# Patient Record
Sex: Female | Born: 1990 | Race: Black or African American | Hispanic: No | Smoking: Former smoker
Health system: Southern US, Community
[De-identification: ages and names within clinical notes are randomized; demographics above are authoritative.]

## PROBLEM LIST (undated history)

## (undated) DIAGNOSIS — N6452 Nipple discharge: Secondary | ICD-10-CM

## (undated) DIAGNOSIS — I10 Essential (primary) hypertension: Secondary | ICD-10-CM

## (undated) DIAGNOSIS — G43909 Migraine, unspecified, not intractable, without status migrainosus: Secondary | ICD-10-CM

## (undated) DIAGNOSIS — Z8669 Personal history of other diseases of the nervous system and sense organs: Principal | ICD-10-CM

## (undated) DIAGNOSIS — IMO0002 Reserved for concepts with insufficient information to code with codable children: Secondary | ICD-10-CM

## (undated) DIAGNOSIS — K219 Gastro-esophageal reflux disease without esophagitis: Secondary | ICD-10-CM

## (undated) DIAGNOSIS — Z87898 Personal history of other specified conditions: Secondary | ICD-10-CM

## (undated) DIAGNOSIS — R87629 Unspecified abnormal cytological findings in specimens from vagina: Secondary | ICD-10-CM

## (undated) HISTORY — DX: Reserved for concepts with insufficient information to code with codable children: IMO0002

## (undated) HISTORY — DX: Personal history of other specified conditions: Z87.898

## (undated) HISTORY — DX: Migraine, unspecified, not intractable, without status migrainosus: G43.909

## (undated) HISTORY — DX: Personal history of other diseases of the nervous system and sense organs: Z86.69

## (undated) HISTORY — PX: NO PAST SURGERIES: SHX2092

## (undated) HISTORY — DX: Unspecified abnormal cytological findings in specimens from vagina: R87.629

## (undated) HISTORY — DX: Gastro-esophageal reflux disease without esophagitis: K21.9

## (undated) HISTORY — DX: Nipple discharge: N64.52

---

## 2011-11-03 ENCOUNTER — Other Ambulatory Visit: Payer: Self-pay | Admitting: Family Medicine

## 2011-11-03 ENCOUNTER — Ambulatory Visit (HOSPITAL_COMMUNITY)
Admission: RE | Admit: 2011-11-03 | Discharge: 2011-11-03 | Disposition: A | Discharge status: Home / Self Care | Payer: BC Managed Care – PPO | Source: Ambulatory Visit | Attending: Family Medicine | Admitting: Family Medicine

## 2011-11-03 ENCOUNTER — Other Ambulatory Visit (HOSPITAL_COMMUNITY): Payer: Self-pay | Admitting: Family Medicine

## 2011-11-03 DIAGNOSIS — R102 Pelvic and perineal pain: Secondary | ICD-10-CM

## 2011-11-03 DIAGNOSIS — R9389 Abnormal findings on diagnostic imaging of other specified body structures: Secondary | ICD-10-CM | POA: Insufficient documentation

## 2011-11-03 DIAGNOSIS — N949 Unspecified condition associated with female genital organs and menstrual cycle: Secondary | ICD-10-CM | POA: Insufficient documentation

## 2012-07-18 ENCOUNTER — Ambulatory Visit (HOSPITAL_COMMUNITY)
Admission: RE | Admit: 2012-07-18 | Discharge: 2012-07-18 | Disposition: A | Discharge status: Home / Self Care | Payer: BC Managed Care – PPO | Source: Ambulatory Visit | Attending: Family Medicine | Admitting: Family Medicine

## 2012-07-18 ENCOUNTER — Other Ambulatory Visit (HOSPITAL_COMMUNITY): Payer: Self-pay | Admitting: Family Medicine

## 2012-07-18 DIAGNOSIS — Z3201 Encounter for pregnancy test, result positive: Secondary | ICD-10-CM

## 2012-07-18 DIAGNOSIS — R102 Pelvic and perineal pain: Secondary | ICD-10-CM

## 2012-07-18 DIAGNOSIS — R109 Unspecified abdominal pain: Secondary | ICD-10-CM | POA: Insufficient documentation

## 2012-08-06 LAB — CYSTIC FIBROSIS DIAGNOSTIC STUDY: Interpretation-CFDNA:: NEGATIVE

## 2012-08-06 LAB — OB RESULTS CONSOLE PLATELET COUNT: Platelets: 329 10*3/uL

## 2012-08-06 LAB — OB RESULTS CONSOLE GC/CHLAMYDIA
Chlamydia: NEGATIVE
Gonorrhea: NEGATIVE

## 2012-08-06 LAB — OB RESULTS CONSOLE RPR: RPR: NONREACTIVE

## 2012-08-06 LAB — OB RESULTS CONSOLE HGB/HCT, BLOOD
HCT: 34 %
Hemoglobin: 11.8 g/dL

## 2012-08-06 LAB — OB RESULTS CONSOLE TSH: TSH: 0.546

## 2012-08-06 LAB — OB RESULTS CONSOLE ABO/RH: RH Type: POSITIVE

## 2012-08-06 LAB — OB RESULTS CONSOLE HIV ANTIBODY (ROUTINE TESTING): HIV: NONREACTIVE

## 2012-08-08 ENCOUNTER — Other Ambulatory Visit: Payer: Self-pay | Admitting: Obstetrics & Gynecology

## 2012-08-08 DIAGNOSIS — Z36 Encounter for antenatal screening of mother: Secondary | ICD-10-CM

## 2012-08-13 ENCOUNTER — Encounter: Payer: Self-pay | Admitting: Adult Health

## 2012-08-13 DIAGNOSIS — Z8669 Personal history of other diseases of the nervous system and sense organs: Secondary | ICD-10-CM

## 2012-08-13 HISTORY — DX: Personal history of other diseases of the nervous system and sense organs: Z86.69

## 2012-08-20 ENCOUNTER — Telehealth: Payer: Self-pay | Admitting: Adult Health

## 2012-08-22 NOTE — Telephone Encounter (Signed)
Spoke with pt. She forgot question she was wanting to ask. Advised to call back if she remembers.

## 2012-08-27 ENCOUNTER — Telehealth: Payer: Self-pay | Admitting: Adult Health

## 2012-08-27 NOTE — Telephone Encounter (Signed)
Spoke with pt. 3 months pregnant. Cramps started 2 hours ago. No spotting or bleeding. Cramping every 20 minutes. Advised to empty bladder. Sip clear liquids. Lay on left side x 1-2 hrs. If cramping don't stop, go to [facility] at [facility] for eval. Pt voiced understanding.

## 2012-09-03 ENCOUNTER — Encounter: Payer: Self-pay | Admitting: Obstetrics & Gynecology

## 2012-09-03 ENCOUNTER — Ambulatory Visit (INDEPENDENT_AMBULATORY_CARE_PROVIDER_SITE_OTHER): Payer: BC Managed Care – PPO | Admitting: Obstetrics & Gynecology

## 2012-09-03 ENCOUNTER — Ambulatory Visit (INDEPENDENT_AMBULATORY_CARE_PROVIDER_SITE_OTHER): Payer: Medicaid Other

## 2012-09-03 ENCOUNTER — Other Ambulatory Visit: Payer: Self-pay | Admitting: Obstetrics & Gynecology

## 2012-09-03 VITALS — BP 110/70 | Wt 107.0 lb

## 2012-09-03 DIAGNOSIS — Z331 Pregnant state, incidental: Secondary | ICD-10-CM

## 2012-09-03 DIAGNOSIS — Z3400 Encounter for supervision of normal first pregnancy, unspecified trimester: Secondary | ICD-10-CM

## 2012-09-03 DIAGNOSIS — Z36 Encounter for antenatal screening of mother: Secondary | ICD-10-CM

## 2012-09-03 DIAGNOSIS — Z3401 Encounter for supervision of normal first pregnancy, first trimester: Secondary | ICD-10-CM

## 2012-09-03 DIAGNOSIS — Z1389 Encounter for screening for other disorder: Secondary | ICD-10-CM

## 2012-09-03 LAB — POCT URINALYSIS DIPSTICK
Blood, UA: NEGATIVE
Nitrite, UA: NEGATIVE

## 2012-09-03 NOTE — Progress Notes (Signed)
U/S-12+4wks-single IUP with + FCA noted FHR=155BPM, CRL c/w dates, cx long and closed, bilateral adnexa wnl, NB present, NT=1.55mm

## 2012-09-03 NOTE — Patient Instructions (Signed)
Pregnancy - First Trimester During sexual intercourse, millions of sperm go into the vagina. Only 1 sperm will penetrate and fertilize the female egg while it is in the Fallopian tube. One week later, the fertilized egg implants into the wall of the uterus. An embryo begins to develop into a baby. At 6 to 8 weeks, the eyes and face are formed and the heartbeat can be seen on ultrasound. At the end of 12 weeks (first trimester), all the baby's organs are formed. Now that you are pregnant, you will want to do everything you can to have a healthy baby. Two of the most important things are to get good prenatal care and follow your caregiver's instructions. Prenatal care is all the medical care you receive before the baby's birth. It is given to prevent, find, and treat problems during the pregnancy and childbirth. PRENATAL EXAMS  During prenatal visits, your weight, blood pressure and urine are checked. This is done to make sure you are healthy and progressing normally during the pregnancy.  A pregnant woman should gain 25 to 35 pounds during the pregnancy. However, if you are over weight or underweight, your caregiver will advise you regarding your weight.  Your caregiver will ask and answer questions for you.  Blood work, cervical cultures, other necessary tests and a Pap test are done during your prenatal exams. These tests are done to check on your health and the probable health of your baby. Tests are strongly recommended and done for HIV with your permission. This is the virus that causes AIDS. These tests are done because medications can be given to help prevent your baby from being born with this infection should you have been infected without knowing it. Blood work is also used to find out your blood type, previous infections and follow your blood levels (hemoglobin).  Low hemoglobin (anemia) is common during pregnancy. Iron and vitamins are given to help prevent this. Later in the pregnancy, blood  tests for diabetes will be done along with any other tests if any problems develop. You may need tests to make sure you and the baby are doing well.  You may need other tests to make sure you and the baby are doing well. CHANGES DURING THE FIRST TRIMESTER (THE FIRST 3 MONTHS OF PREGNANCY) Your body goes through many changes during pregnancy. They vary from person to person. Talk to your caregiver about changes you notice and are concerned about. Changes can include:  Your menstrual period stops.  The egg and sperm carry the genes that determine what you look like. Genes from you and your partner are forming a baby. The female genes determine whether the baby is a boy or a girl.  Your body increases in girth and you may feel bloated.  Feeling sick to your stomach (nauseous) and throwing up (vomiting). If the vomiting is uncontrollable, call your caregiver.  Your breasts will begin to enlarge and become tender.  Your nipples may stick out more and become darker.  The need to urinate more. Painful urination may mean you have a bladder infection.  Tiring easily.  Loss of appetite.  Cravings for certain kinds of food.  At first, you may gain or lose a couple of pounds.  You may have changes in your emotions from day to day (excited to be pregnant or concerned something may go wrong with the pregnancy and baby).  You may have more vivid and strange dreams. HOME CARE INSTRUCTIONS   It is very important   to avoid all smoking, alcohol and un-prescribed drugs during your pregnancy. These affect the formation and growth of the baby. Avoid chemicals while pregnant to ensure the delivery of a healthy infant.  Start your prenatal visits by the 12th week of pregnancy. They are usually scheduled monthly at first, then more often in the last 2 months before delivery. Keep your caregiver's appointments. Follow your caregiver's instructions regarding medication use, blood and lab tests, exercise, and  diet.  During pregnancy, you are providing food for you and your baby. Eat regular, well-balanced meals. Choose foods such as meat, fish, milk and other low fat dairy products, vegetables, fruits, and whole-grain breads and cereals. Your caregiver will tell you of the ideal weight gain.  You can help morning sickness by keeping soda crackers at the bedside. Eat a couple before arising in the morning. You may want to use the crackers without salt on them.  Eating 4 to 5 small meals rather than 3 large meals a day also may help the nausea and vomiting.  Drinking liquids between meals instead of during meals also seems to help nausea and vomiting.  A physical sexual relationship may be continued throughout pregnancy if there are no other problems. Problems may be early (premature) leaking of amniotic fluid from the membranes, vaginal bleeding, or belly (abdominal) pain.  Exercise regularly if there are no restrictions. Check with your caregiver or physical therapist if you are unsure of the safety of some of your exercises. Greater weight gain will occur in the last 2 trimesters of pregnancy. Exercising will help:  Control your weight.  Keep you in shape.  Prepare you for labor and delivery.  Help you lose your pregnancy weight after you deliver your baby.  Wear a good support or jogging bra for breast tenderness during pregnancy. This may help if worn during sleep too.  Ask when prenatal classes are available. Begin classes when they are offered.  Do not use hot tubs, steam rooms or saunas.  Wear your seat belt when driving. This protects you and your baby if you are in an accident.  Avoid raw meat, uncooked cheese, cat litter boxes and soil used by cats throughout the pregnancy. These carry germs that can cause birth defects in the baby.  The first trimester is a good time to visit your dentist for your dental health. Getting your teeth cleaned is OK. Use a softer toothbrush and brush  gently during pregnancy.  Ask for help if you have financial, counseling or nutritional needs during pregnancy. Your caregiver will be able to offer counseling for these needs as well as refer you for other special needs.  Do not take any medications or herbs unless told by your caregiver.  Inform your caregiver if there is any mental or physical domestic violence.  Make a list of emergency phone numbers of family, friends, hospital, and police and fire departments.  Write down your questions. Take them to your prenatal visit.  Do not douche.  Do not cross your legs.  If you have to stand for long periods of time, rotate you feet or take small steps in a circle.  You may have more vaginal secretions that may require a sanitary pad. Do not use tampons or scented sanitary pads. MEDICATIONS AND DRUG USE IN PREGNANCY  Take prenatal vitamins as directed. The vitamin should contain 1 milligram of folic acid. Keep all vitamins out of reach of children. Only a couple vitamins or tablets containing iron may be   fatal to a baby or young child when ingested.  Avoid use of all medications, including herbs, over-the-counter medications, not prescribed or suggested by your caregiver. Only take over-the-counter or prescription medicines for pain, discomfort, or fever as directed by your caregiver. Do not use aspirin, ibuprofen, or naproxen unless directed by your caregiver.  Let your caregiver also know about herbs you may be using.  Alcohol is related to a number of birth defects. This includes fetal alcohol syndrome. All alcohol, in any form, should be avoided completely. Smoking will cause low birth rate and premature babies.  Street or illegal drugs are very harmful to the baby. They are absolutely forbidden. A baby born to an addicted mother will be addicted at birth. The baby will go through the same withdrawal an adult does.  Let your caregiver know about any medications that you have to take  and for what reason you take them. MISCARRIAGE IS COMMON DURING PREGNANCY A miscarriage does not mean you did something wrong. It is not a reason to worry about getting pregnant again. Your caregiver will help you with questions you may have. If you have a miscarriage, you may need minor surgery. SEEK MEDICAL CARE IF:  You have any concerns or worries during your pregnancy. It is better to call with your questions if you feel they cannot wait, rather than worry about them. SEEK IMMEDIATE MEDICAL CARE IF:   An unexplained oral temperature above 102 F (38.9 C) develops, or as your caregiver suggests.  You have leaking of fluid from the vagina (birth canal). If leaking membranes are suspected, take your temperature and inform your caregiver of this when you call.  There is vaginal spotting or bleeding. Notify your caregiver of the amount and how many pads are used.  You develop a bad smelling vaginal discharge with a change in the color.  You continue to feel sick to your stomach (nauseated) and have no relief from remedies suggested. You vomit blood or coffee ground-like materials.  You lose more than 2 pounds of weight in 1 week.  You gain more than 2 pounds of weight in 1 week and you notice swelling of your face, hands, feet, or legs.  You gain 5 pounds or more in 1 week (even if you do not have swelling of your hands, face, legs, or feet).  You get exposed to German measles and have never had them.  You are exposed to fifth disease or chickenpox.  You develop belly (abdominal) pain. Round ligament discomfort is a common non-cancerous (benign) cause of abdominal pain in pregnancy. Your caregiver still must evaluate this.  You develop headache, fever, diarrhea, pain with urination, or shortness of breath.  You fall or are in a car accident or have any kind of trauma.  There is mental or physical violence in your home. Document Released: 05/10/2001 Document Revised: 08/08/2011  Document Reviewed: 11/11/2008 [employer] Patient Information 2013 [employer].  

## 2012-09-03 NOTE — Progress Notes (Signed)
No bleeding, no complaints.  Sonogram report reviewed with patient..  Normal questions answereed, no other complaints

## 2012-09-06 LAB — MATERNAL SCREEN, INTEGRATED #1
Crown Rump Length: 64.2 mm
Maternal weight: 107 [lb_av]
Nuchal translucency: 1.43 mm
Referring physician phone: 3363426063

## 2012-10-02 ENCOUNTER — Encounter: Payer: Self-pay | Admitting: Obstetrics & Gynecology

## 2012-10-02 ENCOUNTER — Ambulatory Visit (INDEPENDENT_AMBULATORY_CARE_PROVIDER_SITE_OTHER): Payer: Medicaid Other | Admitting: Obstetrics & Gynecology

## 2012-10-02 ENCOUNTER — Other Ambulatory Visit: Payer: Self-pay | Admitting: Obstetrics & Gynecology

## 2012-10-02 VITALS — BP 110/70 | Wt 109.0 lb

## 2012-10-02 DIAGNOSIS — Z3400 Encounter for supervision of normal first pregnancy, unspecified trimester: Secondary | ICD-10-CM

## 2012-10-02 DIAGNOSIS — Z3402 Encounter for supervision of normal first pregnancy, second trimester: Secondary | ICD-10-CM

## 2012-10-02 NOTE — Progress Notes (Signed)
BP weight and urine results all reviewed and noted. Patient reports good fetal movement, denies any bleeding and no rupture of membranes symptoms or regular contractions. Patient is without complaints. All questions were answered.  

## 2012-10-02 NOTE — Patient Instructions (Signed)
How a Baby Grows During Pregnancy Pregnancy begins when the female's sperm enters the female's egg. This happens in the fallopian tube and is called fertilization. The fertilized egg is called an embryo until it reaches 9 weeks from the time of fertilization. From 9 weeks until birth it is called a fetus. The fertilized egg moves down the tube into the uterus and attaches to the inside lining of the uterus.  The pregnant woman is responsible for the growth of the embryo/fetus by supplying nourishment and oxygen through the blood stream and placenta to the developing fetus. The uterus becomes larger and pops out from the abdomen more and more as the fetus develops and grows. A normal pregnancy lasts 280 days, with a range of 259 to 294 days, or 40 weeks. The pregnancy is divided up into three trimesters:  First trimester - 0 to 13 weeks.  Second trimester - 14 to 27 weeks.  Third trimester - 28 to 40 weeks. The day your baby is supposed to be born is called estimated date of confinement (EDC) or estimated date of delivery (EDD). GROWTH OF THE BABY MONTH BY MONTH 1. First Month: The fertilized egg attaches to the inside of the uterus and certain cells will form the placenta and others will develop into the fetus. The arms, legs, brain, spinal cord, lungs, and heart begin to develop. At the end of the first month the heart begins to beat. The embryo weighs less than an ounce and is  inch long. 2. Second Month: The bones can be seen, the inner ear, eye lids, hands and feet form and genitals develop. By the end of 8 weeks, all of the major organs are developing. The fetus now weighs less than an ounce and is one inch (2.54 cm) long. 3. Third Month: Teeth buds appear, all the internal organs are forming, bones and muscles begin to grow, the spine can flex and the skin is transparent. Finger and toe nails begin to form, the hands develop faster than the feet and the arms are longer than the legs at this point.  The fetus weighs a little more than an ounce (0.03 kg) and is 3 inches (8.89cm) long. 4. Fourth Month: The placenta is completely formed. The external sex organs, neck, outer ear, eyebrows, eyelids and fingernails are formed. The fetus can hear, swallow, flex its arms and legs and the kidney begins to produce urine. The skin is covered with a white waxy coating (vernix) and very thin hair (lanugo) is present. The fetus weighs 5 ounces (0.14kg) and is 6 to 7 inches (16.51cm) long. 5. Fifth Month: The fetus moves around more and can be felt for the first time (called quickening), sleeps and wakes up at times, may begin to suck its finger and the nails grow to the end of the fingers. The gallbladder is now functioning and helps to digest the nutrients, eggs are formed in the female and the testicles begin to drop down from the abdomen to the scrotum in the female. The fetus weighs  to 1 pound (0.45kg) and is 10 inches (25.4cm) long. 6. Sixth Month: The lungs are formed but the fetus does not breath yet. The eyes open, the brain develops more quickly at this time, one can detect finger and toe prints and thicker hair grows. The fetus weighs 1 to 1 pounds (0.68kg) and is 12 inches (30.48cm) long. 7. Seventh Month: The fetus can hear and respond to sounds, kicks and stretches and can sense   changes in light. The fetus weighs 2 to 2 pounds (1.13kg) and is 14 inches (35.56cm) long. 8. Eight Month: All organs and body systems are fully developed and functioning. The bones get harder, taste buds develop and can taste sweet and sour flavors and the fetus may hiccup now. Different parts of the brain are developing and the skull remains soft for the brain to grow. The fetus weighs 5 pounds (2.27kg) and is 18 inches (45.75cm) long. 9. Ninth Month: The fetus gains about a half a pound a week, the lungs are fully developed, patterns of sleep develop and the head moves down into the bottom of the uterus called vertex. If the  buttocks moves into the bottom of the uterus, it is called a breech. The fetus weighs 6 to 9 pounds (2.72 to 4.08kg) and is 20 inches (50.8cm) long. You should be informed about your pregnancy, yourself and how the baby is developing as much as possible. Being informed helps you to enjoy this experience. It also gives you the sense to feel if something is not going right and when to ask questions. Talk to your caregiver when you have questions about your baby or your own body. Document Released: 11/02/2007 Document Revised: 08/08/2011 Document Reviewed: 11/02/2007 [employer] Patient Information 2013 [employer].  

## 2012-10-07 LAB — MATERNAL SCREEN, INTEGRATED #2
AFP MoM: 1.32
Calculated Gestational Age: 16.7
Crown Rump Length: 64.2 mm
MSS Trisomy 18 Risk: <1:5000 {titer}
NT MoM: 0.98
Nuchal Translucency: 1.43 mm
PAPP-A MoM: 1
hCG, Serum: 29.5 IU/mL

## 2012-10-23 ENCOUNTER — Ambulatory Visit (INDEPENDENT_AMBULATORY_CARE_PROVIDER_SITE_OTHER): Payer: Medicaid Other

## 2012-10-23 ENCOUNTER — Ambulatory Visit (INDEPENDENT_AMBULATORY_CARE_PROVIDER_SITE_OTHER): Payer: Medicaid Other | Admitting: Women's Health

## 2012-10-23 ENCOUNTER — Encounter: Payer: Self-pay | Admitting: Women's Health

## 2012-10-23 VITALS — BP 110/74 | Wt 109.0 lb

## 2012-10-23 DIAGNOSIS — Z331 Pregnant state, incidental: Secondary | ICD-10-CM

## 2012-10-23 DIAGNOSIS — Z1389 Encounter for screening for other disorder: Secondary | ICD-10-CM

## 2012-10-23 DIAGNOSIS — Z3402 Encounter for supervision of normal first pregnancy, second trimester: Secondary | ICD-10-CM

## 2012-10-23 DIAGNOSIS — Z3400 Encounter for supervision of normal first pregnancy, unspecified trimester: Secondary | ICD-10-CM

## 2012-10-23 LAB — POCT URINALYSIS DIPSTICK
Ketones, UA: NEGATIVE
Leukocytes, UA: NEGATIVE

## 2012-10-23 NOTE — Progress Notes (Signed)
U/S(19+5wks)-active fetus, meas c/w dates, fluid wnl, no major abnl noted, anterior plac gr 0, cx long and closed, bilateral adnexa wnl, female fetus

## 2012-10-23 NOTE — Progress Notes (Signed)
Reports good fm. Denies uc's, lof, vb, urinary frequency, urgency, hesitancy, or dysuria.  No complaints.  Reviewed u/s results from today, ptl s/s, childbirth classes.  All questions answered. F/U in 4wks for visit.

## 2012-10-23 NOTE — Patient Instructions (Addendum)
Pregnancy - Second Trimester The second trimester of pregnancy (3 to 6 months) is a period of rapid growth for you and your baby. At the end of the sixth month, your baby is about 9 inches long and weighs 1 1/2 pounds. You will begin to feel the baby move between 18 and 20 weeks of the pregnancy. This is called quickening. Weight gain is faster. A clear fluid (colostrum) may leak out of your breasts. You may feel small contractions of the womb (uterus). This is known as false labor or Braxton-Hicks contractions. This is like a practice for labor when the baby is ready to be born. Usually, the problems with morning sickness have usually passed by the end of your first trimester. Some women develop small dark blotches (called cholasma, mask of pregnancy) on their face that usually goes away after the baby is born. Exposure to the sun makes the blotches worse. Acne may also develop in some pregnant women and pregnant women who have acne, may find that it goes away. PRENATAL EXAMS  Blood work may continue to be done during prenatal exams. These tests are done to check on your health and the probable health of your baby. Blood work is used to follow your blood levels (hemoglobin). Anemia (low hemoglobin) is common during pregnancy. Iron and vitamins are given to help prevent this. You will also be checked for diabetes between 24 and 28 weeks of the pregnancy. Some of the previous blood tests may be repeated.  The size of the uterus is measured during each visit. This is to make sure that the baby is continuing to grow properly according to the dates of the pregnancy.  Your blood pressure is checked every prenatal visit. This is to make sure you are not getting toxemia.  Your urine is checked to make sure you do not have an infection, diabetes or protein in the urine.  Your weight is checked often to make sure gains are happening at the suggested rate. This is to ensure that both you and your baby are  growing normally.  Sometimes, an ultrasound is performed to confirm the proper growth and development of the baby. This is a test which bounces harmless sound waves off the baby so your caregiver can more accurately determine due dates. Sometimes, a test is done on the amniotic fluid surrounding the baby. This test is called an amniocentesis. The amniotic fluid is obtained by sticking a needle into the belly (abdomen). This is done to check the chromosomes in instances where there is a concern about possible genetic problems with the baby. It is also sometimes done near the end of pregnancy if an early delivery is required. In this case, it is done to help make sure the baby's lungs are mature enough for the baby to live outside of the womb. CHANGES OCCURING IN THE SECOND TRIMESTER OF PREGNANCY Your body goes through many changes during pregnancy. They vary from person to person. Talk to your caregiver about changes you notice that you are concerned about.  During the second trimester, you will likely have an increase in your appetite. It is normal to have cravings for certain foods. This varies from person to person and pregnancy to pregnancy.  Your lower abdomen will begin to bulge.  You may have to urinate more often because the uterus and baby are pressing on your bladder. It is also common to get more bladder infections during pregnancy. You can help this by drinking lots of fluids   and emptying your bladder before and after intercourse.  You may begin to get stretch marks on your hips, abdomen, and breasts. These are normal changes in the body during pregnancy. There are no exercises or medicines to take that prevent this change.  You may begin to develop swollen and bulging veins (varicose veins) in your legs. Wearing support hose, elevating your feet for 15 minutes, 3 to 4 times a day and limiting salt in your diet helps lessen the problem.  Heartburn may develop as the uterus grows and  pushes up against the stomach. Antacids recommended by your caregiver helps with this problem. Also, eating smaller meals 4 to 5 times a day helps.  Constipation can be treated with a stool softener or adding bulk to your diet. Drinking lots of fluids, and eating vegetables, fruits, and whole grains are helpful.  Exercising is also helpful. If you have been very active up until your pregnancy, most of these activities can be continued during your pregnancy. If you have been less active, it is helpful to start an exercise program such as walking.  Hemorrhoids may develop at the end of the second trimester. Warm sitz baths and hemorrhoid cream recommended by your caregiver helps hemorrhoid problems.  Backaches may develop during this time of your pregnancy. Avoid heavy lifting, wear low heal shoes, and practice good posture to help with backache problems.  Some pregnant women develop tingling and numbness of their hand and fingers because of swelling and tightening of ligaments in the wrist (carpel tunnel syndrome). This goes away after the baby is born.  As your breasts enlarge, you may have to get a bigger bra. Get a comfortable, cotton, support bra. Do not get a nursing bra until the last month of the pregnancy if you will be nursing the baby.  You may get a dark line from your belly button to the pubic area called the linea nigra.  You may develop rosy cheeks because of increase blood flow to the face.  You may develop spider looking lines of the face, neck, arms, and chest. These go away after the baby is born. HOME CARE INSTRUCTIONS   It is extremely important to avoid all smoking, herbs, alcohol, and unprescribed drugs during your pregnancy. These chemicals affect the formation and growth of the baby. Avoid these chemicals throughout the pregnancy to ensure the delivery of a healthy infant.  Most of your home care instructions are the same as suggested for the first trimester of your  pregnancy. Keep your caregiver's appointments. Follow your caregiver's instructions regarding medicine use, exercise, and diet.  During pregnancy, you are providing food for you and your baby. Continue to eat regular, well-balanced meals. Choose foods such as meat, fish, milk and other low fat dairy products, vegetables, fruits, and whole-grain breads and cereals. Your caregiver will tell you of the ideal weight gain.  A physical sexual relationship may be continued up until near the end of pregnancy if there are no other problems. Problems could include early (premature) leaking of amniotic fluid from the membranes, vaginal bleeding, abdominal pain, or other medical or pregnancy problems.  Exercise regularly if there are no restrictions. Check with your caregiver if you are unsure of the safety of some of your exercises. The greatest weight gain will occur in the last 2 trimesters of pregnancy. Exercise will help you:  Control your weight.  Get you in shape for labor and delivery.  Lose weight after you have the baby.  Wear   a good support or jogging bra for breast tenderness during pregnancy. This may help if worn during sleep. Pads or tissues may be used in the bra if you are leaking colostrum.  Do not use hot tubs, steam rooms or saunas throughout the pregnancy.  Wear your seat belt at all times when driving. This protects you and your baby if you are in an accident.  Avoid raw meat, uncooked cheese, cat litter boxes, and soil used by cats. These carry germs that can cause birth defects in the baby.  The second trimester is also a good time to visit your dentist for your dental health if this has not been done yet. Getting your teeth cleaned is okay. Use a soft toothbrush. Brush gently during pregnancy.  It is easier to leak urine during pregnancy. Tightening up and strengthening the pelvic muscles will help with this problem. Practice stopping your urination while you are going to the  bathroom. These are the same muscles you need to strengthen. It is also the muscles you would use as if you were trying to stop from passing gas. You can practice tightening these muscles up 10 times a set and repeating this about 3 times per day. Once you know what muscles to tighten up, do not perform these exercises during urination. It is more likely to contribute to an infection by backing up the urine.  Ask for help if you have financial, counseling, or nutritional needs during pregnancy. Your caregiver will be able to offer counseling for these needs as well as refer you for other special needs.  Your skin may become oily. If so, wash your face with mild soap, use non-greasy moisturizer and oil or cream based makeup. MEDICINES AND DRUG USE IN PREGNANCY  Take prenatal vitamins as directed. The vitamin should contain 1 milligram of folic acid. Keep all vitamins out of reach of children. Only a couple vitamins or tablets containing iron may be fatal to a baby or young child when ingested.  Avoid use of all medicines, including herbs, over-the-counter medicines, not prescribed or suggested by your caregiver. Only take over-the-counter or prescription medicines for pain, discomfort, or fever as directed by your caregiver. Do not use aspirin.  Let your caregiver also know about herbs you may be using.  Alcohol is related to a number of birth defects. This includes fetal alcohol syndrome. All alcohol, in any form, should be avoided completely. Smoking will cause low birth rate and premature babies.  Street or illegal drugs are very harmful to the baby. They are absolutely forbidden. A baby born to an addicted mother will be addicted at birth. The baby will go through the same withdrawal an adult does. SEEK MEDICAL CARE IF:  You have any concerns or worries during your pregnancy. It is better to call with your questions if you feel they cannot wait, rather than worry about them. SEEK IMMEDIATE  MEDICAL CARE IF:   An unexplained oral temperature above 102 F (38.9 C) develops, or as your caregiver suggests.  You have leaking of fluid from the vagina (birth canal). If leaking membranes are suspected, take your temperature and tell your caregiver of this when you call.  There is vaginal spotting, bleeding, or passing clots. Tell your caregiver of the amount and how many pads are used. Light spotting in pregnancy is common, especially following intercourse.  You develop a bad smelling vaginal discharge with a change in the color from clear to white.  You continue to feel   sick to your stomach (nauseated) and have no relief from remedies suggested. You vomit blood or coffee ground-like materials.  You lose more than 2 pounds of weight or gain more than 2 pounds of weight over 1 week, or as suggested by your caregiver.  You notice swelling of your face, hands, feet, or legs.  You get exposed to German measles and have never had them.  You are exposed to fifth disease or chickenpox.  You develop belly (abdominal) pain. Round ligament discomfort is a common non-cancerous (benign) cause of abdominal pain in pregnancy. Your caregiver still must evaluate you.  You develop a bad headache that does not go away.  You develop fever, diarrhea, pain with urination, or shortness of breath.  You develop visual problems, blurry, or double vision.  You fall or are in a car accident or any kind of trauma.  There is mental or physical violence at home. Document Released: 05/10/2001 Document Revised: 02/08/2012 Document Reviewed: 11/12/2008 [employer] Patient Information 2014 [employer].  

## 2012-10-23 NOTE — Progress Notes (Signed)
Cramps in both sides. Left leg goes numb.

## 2012-10-24 LAB — US OB COMP + 14 WK

## 2012-11-20 ENCOUNTER — Ambulatory Visit (INDEPENDENT_AMBULATORY_CARE_PROVIDER_SITE_OTHER): Payer: Medicaid Other | Admitting: Women's Health

## 2012-11-20 ENCOUNTER — Encounter: Payer: Self-pay | Admitting: Women's Health

## 2012-11-20 VITALS — BP 118/68 | Wt 112.8 lb

## 2012-11-20 DIAGNOSIS — Z3400 Encounter for supervision of normal first pregnancy, unspecified trimester: Secondary | ICD-10-CM

## 2012-11-20 DIAGNOSIS — Z331 Pregnant state, incidental: Secondary | ICD-10-CM

## 2012-11-20 DIAGNOSIS — Z3402 Encounter for supervision of normal first pregnancy, second trimester: Secondary | ICD-10-CM

## 2012-11-20 DIAGNOSIS — Z1389 Encounter for screening for other disorder: Secondary | ICD-10-CM

## 2012-11-20 LAB — POCT URINALYSIS DIPSTICK
Blood, UA: NEGATIVE
Glucose, UA: NEGATIVE
Leukocytes, UA: NEGATIVE
Nitrite, UA: NEGATIVE

## 2012-11-20 NOTE — Progress Notes (Signed)
Unable to void. Reports good fm. Denies uc's, lof, vb, urinary frequency, urgency, hesitancy, or dysuria.  No complaints.  Reviewed ptl s/s, fm.  All questions answered. F/U in 4wks for pn2 and visit.

## 2012-11-20 NOTE — Patient Instructions (Signed)
You will have your sugar test next visit.  Please do not eat or drink anything after midnight the night before you come, not even water.  You will be here for at least two hours.    Pregnancy - Second Trimester The second trimester of pregnancy (3 to 6 months) is a period of rapid growth for you and your baby. At the end of the sixth month, your baby is about 9 inches long and weighs 1 1/2 pounds. You will begin to feel the baby move between 18 and 20 weeks of the pregnancy. This is called quickening. Weight gain is faster. A clear fluid (colostrum) may leak out of your breasts. You may feel small contractions of the womb (uterus). This is known as false labor or Braxton-Hicks contractions. This is like a practice for labor when the baby is ready to be born. Usually, the problems with morning sickness have usually passed by the end of your first trimester. Some women develop small dark blotches (called cholasma, mask of pregnancy) on their face that usually goes away after the baby is born. Exposure to the sun makes the blotches worse. Acne may also develop in some pregnant women and pregnant women who have acne, may find that it goes away. PRENATAL EXAMS  Blood work may continue to be done during prenatal exams. These tests are done to check on your health and the probable health of your baby. Blood work is used to follow your blood levels (hemoglobin). Anemia (low hemoglobin) is common during pregnancy. Iron and vitamins are given to help prevent this. You will also be checked for diabetes between 24 and 28 weeks of the pregnancy. Some of the previous blood tests may be repeated.  The size of the uterus is measured during each visit. This is to make sure that the baby is continuing to grow properly according to the dates of the pregnancy.  Your blood pressure is checked every prenatal visit. This is to make sure you are not getting toxemia.  Your urine is checked to make sure you do not have an  infection, diabetes or protein in the urine.  Your weight is checked often to make sure gains are happening at the suggested rate. This is to ensure that both you and your baby are growing normally.  Sometimes, an ultrasound is performed to confirm the proper growth and development of the baby. This is a test which bounces harmless sound waves off the baby so your caregiver can more accurately determine due dates. Sometimes, a test is done on the amniotic fluid surrounding the baby. This test is called an amniocentesis. The amniotic fluid is obtained by sticking a needle into the belly (abdomen). This is done to check the chromosomes in instances where there is a concern about possible genetic problems with the baby. It is also sometimes done near the end of pregnancy if an early delivery is required. In this case, it is done to help make sure the baby's lungs are mature enough for the baby to live outside of the womb. CHANGES OCCURING IN THE SECOND TRIMESTER OF PREGNANCY Your body goes through many changes during pregnancy. They vary from person to person. Talk to your caregiver about changes you notice that you are concerned about.  During the second trimester, you will likely have an increase in your appetite. It is normal to have cravings for certain foods. This varies from person to person and pregnancy to pregnancy.  Your lower abdomen will begin to   bulge.  You may have to urinate more often because the uterus and baby are pressing on your bladder. It is also common to get more bladder infections during pregnancy. You can help this by drinking lots of fluids and emptying your bladder before and after intercourse.  You may begin to get stretch marks on your hips, abdomen, and breasts. These are normal changes in the body during pregnancy. There are no exercises or medicines to take that prevent this change.  You may begin to develop swollen and bulging veins (varicose veins) in your legs.  Wearing support hose, elevating your feet for 15 minutes, 3 to 4 times a day and limiting salt in your diet helps lessen the problem.  Heartburn may develop as the uterus grows and pushes up against the stomach. Antacids recommended by your caregiver helps with this problem. Also, eating smaller meals 4 to 5 times a day helps.  Constipation can be treated with a stool softener or adding bulk to your diet. Drinking lots of fluids, and eating vegetables, fruits, and whole grains are helpful.  Exercising is also helpful. If you have been very active up until your pregnancy, most of these activities can be continued during your pregnancy. If you have been less active, it is helpful to start an exercise program such as walking.  Hemorrhoids may develop at the end of the second trimester. Warm sitz baths and hemorrhoid cream recommended by your caregiver helps hemorrhoid problems.  Backaches may develop during this time of your pregnancy. Avoid heavy lifting, wear low heal shoes, and practice good posture to help with backache problems.  Some pregnant women develop tingling and numbness of their hand and fingers because of swelling and tightening of ligaments in the wrist (carpel tunnel syndrome). This goes away after the baby is born.  As your breasts enlarge, you may have to get a bigger bra. Get a comfortable, cotton, support bra. Do not get a nursing bra until the last month of the pregnancy if you will be nursing the baby.  You may get a dark line from your belly button to the pubic area called the linea nigra.  You may develop rosy cheeks because of increase blood flow to the face.  You may develop spider looking lines of the face, neck, arms, and chest. These go away after the baby is born. HOME CARE INSTRUCTIONS   It is extremely important to avoid all smoking, herbs, alcohol, and unprescribed drugs during your pregnancy. These chemicals affect the formation and growth of the baby. Avoid  these chemicals throughout the pregnancy to ensure the delivery of a healthy infant.  Most of your home care instructions are the same as suggested for the first trimester of your pregnancy. Keep your caregiver's appointments. Follow your caregiver's instructions regarding medicine use, exercise, and diet.  During pregnancy, you are providing food for you and your baby. Continue to eat regular, well-balanced meals. Choose foods such as meat, fish, milk and other low fat dairy products, vegetables, fruits, and whole-grain breads and cereals. Your caregiver will tell you of the ideal weight gain.  A physical sexual relationship may be continued up until near the end of pregnancy if there are no other problems. Problems could include early (premature) leaking of amniotic fluid from the membranes, vaginal bleeding, abdominal pain, or other medical or pregnancy problems.  Exercise regularly if there are no restrictions. Check with your caregiver if you are unsure of the safety of some of your exercises. The   greatest weight gain will occur in the last 2 trimesters of pregnancy. Exercise will help you:  Control your weight.  Get you in shape for labor and delivery.  Lose weight after you have the baby.  Wear a good support or jogging bra for breast tenderness during pregnancy. This may help if worn during sleep. Pads or tissues may be used in the bra if you are leaking colostrum.  Do not use hot tubs, steam rooms or saunas throughout the pregnancy.  Wear your seat belt at all times when driving. This protects you and your baby if you are in an accident.  Avoid raw meat, uncooked cheese, cat litter boxes, and soil used by cats. These carry germs that can cause birth defects in the baby.  The second trimester is also a good time to visit your dentist for your dental health if this has not been done yet. Getting your teeth cleaned is okay. Use a soft toothbrush. Brush gently during pregnancy.  It is  easier to leak urine during pregnancy. Tightening up and strengthening the pelvic muscles will help with this problem. Practice stopping your urination while you are going to the bathroom. These are the same muscles you need to strengthen. It is also the muscles you would use as if you were trying to stop from passing gas. You can practice tightening these muscles up 10 times a set and repeating this about 3 times per day. Once you know what muscles to tighten up, do not perform these exercises during urination. It is more likely to contribute to an infection by backing up the urine.  Ask for help if you have financial, counseling, or nutritional needs during pregnancy. Your caregiver will be able to offer counseling for these needs as well as refer you for other special needs.  Your skin may become oily. If so, wash your face with mild soap, use non-greasy moisturizer and oil or cream based makeup. MEDICINES AND DRUG USE IN PREGNANCY  Take prenatal vitamins as directed. The vitamin should contain 1 milligram of folic acid. Keep all vitamins out of reach of children. Only a couple vitamins or tablets containing iron may be fatal to a baby or young child when ingested.  Avoid use of all medicines, including herbs, over-the-counter medicines, not prescribed or suggested by your caregiver. Only take over-the-counter or prescription medicines for pain, discomfort, or fever as directed by your caregiver. Do not use aspirin.  Let your caregiver also know about herbs you may be using.  Alcohol is related to a number of birth defects. This includes fetal alcohol syndrome. All alcohol, in any form, should be avoided completely. Smoking will cause low birth rate and premature babies.  Street or illegal drugs are very harmful to the baby. They are absolutely forbidden. A baby born to an addicted mother will be addicted at birth. The baby will go through the same withdrawal an adult does. SEEK MEDICAL CARE IF:    You have any concerns or worries during your pregnancy. It is better to call with your questions if you feel they cannot wait, rather than worry about them. SEEK IMMEDIATE MEDICAL CARE IF:   An unexplained oral temperature above 102 F (38.9 C) develops, or as your caregiver suggests.  You have leaking of fluid from the vagina (birth canal). If leaking membranes are suspected, take your temperature and tell your caregiver of this when you call.  There is vaginal spotting, bleeding, or passing clots. Tell your caregiver of   the amount and how many pads are used. Light spotting in pregnancy is common, especially following intercourse.  You develop a bad smelling vaginal discharge with a change in the color from clear to white.  You continue to feel sick to your stomach (nauseated) and have no relief from remedies suggested. You vomit blood or coffee ground-like materials.  You lose more than 2 pounds of weight or gain more than 2 pounds of weight over 1 week, or as suggested by your caregiver.  You notice swelling of your face, hands, feet, or legs.  You get exposed to German measles and have never had them.  You are exposed to fifth disease or chickenpox.  You develop belly (abdominal) pain. Round ligament discomfort is a common non-cancerous (benign) cause of abdominal pain in pregnancy. Your caregiver still must evaluate you.  You develop a bad headache that does not go away.  You develop fever, diarrhea, pain with urination, or shortness of breath.  You develop visual problems, blurry, or double vision.  You fall or are in a car accident or any kind of trauma.  There is mental or physical violence at home. Document Released: 05/10/2001 Document Revised: 02/08/2012 Document Reviewed: 11/12/2008 [employer] Patient Information 2014 [employer].  

## 2012-12-18 ENCOUNTER — Ambulatory Visit (INDEPENDENT_AMBULATORY_CARE_PROVIDER_SITE_OTHER): Payer: Medicaid Other | Admitting: Women's Health

## 2012-12-18 ENCOUNTER — Other Ambulatory Visit: Payer: Medicaid Other

## 2012-12-18 ENCOUNTER — Encounter: Payer: Self-pay | Admitting: Women's Health

## 2012-12-18 VITALS — BP 120/80 | Wt 119.6 lb

## 2012-12-18 DIAGNOSIS — O9989 Other specified diseases and conditions complicating pregnancy, childbirth and the puerperium: Secondary | ICD-10-CM

## 2012-12-18 DIAGNOSIS — Z3402 Encounter for supervision of normal first pregnancy, second trimester: Secondary | ICD-10-CM

## 2012-12-18 DIAGNOSIS — Z331 Pregnant state, incidental: Secondary | ICD-10-CM

## 2012-12-18 DIAGNOSIS — Z1389 Encounter for screening for other disorder: Secondary | ICD-10-CM

## 2012-12-18 DIAGNOSIS — R3915 Urgency of urination: Secondary | ICD-10-CM

## 2012-12-18 DIAGNOSIS — B081 Molluscum contagiosum: Secondary | ICD-10-CM | POA: Insufficient documentation

## 2012-12-18 LAB — POCT URINALYSIS DIPSTICK
Blood, UA: NEGATIVE
Ketones, UA: NEGATIVE
Protein, UA: NEGATIVE

## 2012-12-18 LAB — CBC
Hemoglobin: 10.1 g/dL — ABNORMAL LOW (ref 12.0–15.0)
MCH: 31.9 pg (ref 26.0–34.0)
RBC: 3.17 MIL/uL — ABNORMAL LOW (ref 3.87–5.11)
WBC: 7.1 10*3/uL (ref 4.0–10.5)

## 2012-12-18 NOTE — Patient Instructions (Addendum)
Preterm Labor Preterm labor is when labor starts at less than 37 weeks of pregnancy. The normal length of a pregnancy is 39 to 41 weeks. CAUSES Often, there is no identifiable underlying cause as to why a woman goes into preterm labor. However, one of the most common known causes of preterm labor is infection. Infections of the uterus, cervix, vagina, amniotic sac, bladder, kidney, or even the lungs (pneumonia) can cause labor to start. Other causes of preterm labor include:  Urogenital infections, such as yeast infections and bacterial vaginosis.  Uterine abnormalities (uterine shape, uterine septum, fibroids, bleeding from the placenta).  A cervix that has been operated on and opens prematurely.  Malformations in the baby.  Multiple gestations (twins, triplets, and so on).  Breakage of the amniotic sac. Additional risk factors for preterm labor include:  Previous history of preterm labor.  Premature rupture of membranes (PROM).  A placenta that covers the opening of the cervix (placenta previa).  A placenta that separates from the uterus (placenta abruption).  A cervix that is too weak to hold the baby in the uterus (incompetence cervix).  Having too much fluid in the amniotic sac (polyhydramnios).  Taking illegal drugs or smoking while pregnant.  Not gaining enough weight while pregnant.  Women younger than 33 and older than 21 years old.  Low socioeconomic status.  African-American ethnicity. SYMPTOMS Signs and symptoms of preterm labor include:  Menstrual-like cramps.  Contractions that are 30 to 70 seconds apart, become very regular, closer together, and are more intense and painful.  Contractions that start on the top of the uterus and spread down to the lower abdomen and back.  A sense of increased pelvic pressure or back pain.  A watery or bloody discharge that comes from the vagina. DIAGNOSIS  A diagnosis can be confirmed by:  A vaginal exam.  An  ultrasound of the cervix.  Sampling (swabbing) cervico-vaginal secretions. These samples can be tested for the presence of fetal fibronectin. This is a protein found in cervical discharge which is associated with preterm labor.  Fetal monitoring. TREATMENT  Depending on the length of the pregnancy and other circumstances, a caregiver may suggest bed rest. If necessary, there are medicines that can be given to stop contractions and to quicken fetal lung maturity. If labor happens before 34 weeks of pregnancy, a prolonged hospital stay may be recommended. Treatment depends on the condition of both the mother and baby. PREVENTION There are some things a mother can do to lower the risk of preterm labor in future pregnancies. A woman can:   Stop smoking.  Maintain healthy weight gain and avoid chemicals and drugs that are not necessary.  Be watchful for any type of infection.  Inform her caregiver if she has a known history of preterm labor. Document Released: 08/06/2003 Document Revised: 08/08/2011 Document Reviewed: 09/10/2010 [facility] Patient Information 2014 [place], [place]. Molluscum Contagiosum Molluscum contagiosum is a viral infection of the skin that causes smooth surfaced, firm, small (3 to 5 mm), dome-shaped bumps (papules) which are flesh-colored. The bumps usually do not hurt or itch. In children, they most often appear on the face, trunk, arms and legs. In adults, the growths are commonly found on the genitals, thighs, face, neck, and belly (abdomen). The infection may be spread to others by close (skin to skin) contact (such as occurs in schools and swimming pools), sharing towels and clothing, and through sexual contact. The bumps usually disappear without treatment in 2 to 4 months, especially  in children. You may have them treated to avoid spreading them. Scraping (curetting) the middle part (central plug) of the bump with a needle or sharp curette, or application of liquid nitrogen  for 8 or 9 seconds usually cures the infection.  HOME CARE INSTRUCTIONS   Do not scratch the bumps. This may spread the infection to other parts of the body and to other people.  Avoid close contact with others, including sexual contact, until the bumps disappear. Do not share towels or clothing.  If liquid nitrogen was used, blisters will form. Leave the blisters alone and cover with a bandage. The tops will fall off by themselves in 7 to 14 days.  Four months without a lesion is usually a cure. SEEK IMMEDIATE MEDICAL CARE IF:  You have a fever.  You develop swelling, redness, pain, tenderness, or warmth in the areas of the bumps. They may be infected. Document Released: 05/13/2000 Document Revised: 08/08/2011 Document Reviewed: 10/24/2008 [facility] Patient Information 2014 [place], [place].

## 2012-12-18 NOTE — Progress Notes (Signed)
Reports good fm. Denies uc's, lof, vb, urinary frequency, urgency, hesitancy, or dysuria.  Pelvic pressure and bumps near vagina- has been using Darene Lamer hair removal. SVE: LTC. Molloscum contagiosum Lt inner thigh/lower buttocks near vestibule. Unable to get good breath and legs go numb when sitting too long, and dizziness when out in hot weather- reviewed relief measures.  Reviewed ptl s/s, fetal kick counts.  All questions answered. PN2 today. F/U in 4wks for visit.

## 2012-12-19 LAB — GLUCOSE TOLERANCE, 2 HOURS W/ 1HR
Glucose, 1 hour: 96 mg/dL (ref 70–170)
Glucose, 2 hour: 90 mg/dL (ref 70–139)
Glucose, Fasting: 70 mg/dL (ref 70–99)

## 2012-12-19 LAB — RPR

## 2012-12-19 LAB — HIV ANTIBODY (ROUTINE TESTING W REFLEX): HIV: NONREACTIVE

## 2012-12-20 ENCOUNTER — Encounter: Payer: Self-pay | Admitting: Women's Health

## 2012-12-20 DIAGNOSIS — R768 Other specified abnormal immunological findings in serum: Secondary | ICD-10-CM | POA: Insufficient documentation

## 2012-12-20 DIAGNOSIS — R7689 Other specified abnormal immunological findings in serum: Secondary | ICD-10-CM | POA: Insufficient documentation

## 2012-12-31 ENCOUNTER — Telehealth: Payer: Self-pay | Admitting: Obstetrics & Gynecology

## 2012-12-31 NOTE — Telephone Encounter (Signed)
Pt states having a clear to whitish discharge, looked it up on line and states she thinks it is part of her mucus plug. Explained to pt normal to have a clear to whitish discharge during pregnancy, however there should not be any odor, color, irritation with discharge. Pt states no odor, irritation, or color to discharge. Pt has an appt in 3 weeks. Pt to call back if there is any change in discharge.

## 2013-01-15 ENCOUNTER — Ambulatory Visit (INDEPENDENT_AMBULATORY_CARE_PROVIDER_SITE_OTHER): Payer: Medicaid Other | Admitting: Women's Health

## 2013-01-15 ENCOUNTER — Encounter: Payer: Self-pay | Admitting: Women's Health

## 2013-01-15 VITALS — BP 120/66 | Wt 124.0 lb

## 2013-01-15 DIAGNOSIS — Z331 Pregnant state, incidental: Secondary | ICD-10-CM

## 2013-01-15 DIAGNOSIS — O2390 Unspecified genitourinary tract infection in pregnancy, unspecified trimester: Secondary | ICD-10-CM

## 2013-01-15 DIAGNOSIS — R768 Other specified abnormal immunological findings in serum: Secondary | ICD-10-CM

## 2013-01-15 DIAGNOSIS — Z3403 Encounter for supervision of normal first pregnancy, third trimester: Secondary | ICD-10-CM

## 2013-01-15 DIAGNOSIS — Z1389 Encounter for screening for other disorder: Secondary | ICD-10-CM

## 2013-01-15 DIAGNOSIS — O98519 Other viral diseases complicating pregnancy, unspecified trimester: Secondary | ICD-10-CM

## 2013-01-15 LAB — POCT URINALYSIS DIPSTICK
Glucose, UA: NEGATIVE
Ketones, UA: NEGATIVE
Leukocytes, UA: NEGATIVE

## 2013-01-15 NOTE — Patient Instructions (Addendum)
Preterm Labor Preterm labor is when labor starts at less than 37 weeks of pregnancy. The normal length of a pregnancy is 39 to 41 weeks. CAUSES Often, there is no identifiable underlying cause as to why a woman goes into preterm labor. However, one of the most common known causes of preterm labor is infection. Infections of the uterus, cervix, vagina, amniotic sac, bladder, kidney, or even the lungs (pneumonia) can cause labor to start. Other causes of preterm labor include:  Urogenital infections, such as yeast infections and bacterial vaginosis.  Uterine abnormalities (uterine shape, uterine septum, fibroids, bleeding from the placenta).  A cervix that has been operated on and opens prematurely.  Malformations in the baby.  Multiple gestations (twins, triplets, and so on).  Breakage of the amniotic sac. Additional risk factors for preterm labor include:  Previous history of preterm labor.  Premature rupture of membranes (PROM).  A placenta that covers the opening of the cervix (placenta previa).  A placenta that separates from the uterus (placenta abruption).  A cervix that is too weak to hold the baby in the uterus (incompetence cervix).  Having too much fluid in the amniotic sac (polyhydramnios).  Taking illegal drugs or smoking while pregnant.  Not gaining enough weight while pregnant.  Women younger than 18 and older than 21 years old.  Low socioeconomic status.  African-American ethnicity. SYMPTOMS Signs and symptoms of preterm labor include:  Menstrual-like cramps.  Contractions that are 30 to 70 seconds apart, become very regular, closer together, and are more intense and painful.  Contractions that start on the top of the uterus and spread down to the lower abdomen and back.  A sense of increased pelvic pressure or back pain.  A watery or bloody discharge that comes from the vagina. DIAGNOSIS  A diagnosis can be confirmed by:  A vaginal exam.  An  ultrasound of the cervix.  Sampling (swabbing) cervico-vaginal secretions. These samples can be tested for the presence of fetal fibronectin. This is a protein found in cervical discharge which is associated with preterm labor.  Fetal monitoring. TREATMENT  Depending on the length of the pregnancy and other circumstances, a caregiver may suggest bed rest. If necessary, there are medicines that can be given to stop contractions and to quicken fetal lung maturity. If labor happens before 34 weeks of pregnancy, a prolonged hospital stay may be recommended. Treatment depends on the condition of both the mother and baby. PREVENTION There are some things a mother can do to lower the risk of preterm labor in future pregnancies. A woman can:   Stop smoking.  Maintain healthy weight gain and avoid chemicals and drugs that are not necessary.  Be watchful for any type of infection.  Inform her caregiver if she has a known history of preterm labor. Document Released: 08/06/2003 Document Revised: 08/08/2011 Document Reviewed: 09/10/2010 [employer] Patient Information 2014 [employer]. Herpes During and After Pregnancy Genital herpes is a sexually transmitted infection (STI). It is caused by a virus and can be very serious during pregnancy. The greatest concern is passing the virus to the fetus and newborn. The virus is more likely to be passed to the newborn during delivery if the mother becomes infected for the first time late in her pregnancy (primary infection). It is less common for the virus to pass to the newborn if the mother had herpes before becoming pregnant. This is because antibodies against the virus develop over a period of time. These antibodies help protect the baby.   Lastly, the infection can pass to the fetus through the placenta. This can happen if the mother gets herpes for the first time in the first 3 months of her pregnancy (first trimester). This can possibly cause a miscarriage or  birth defects in the baby. TREATMENT DURING PREGNANCY Medicines may be prescribed that are safe for the mother and the fetus. The medicine can lessen symptoms or prevent a recurrence of the infection. If the infection happened before becoming pregnant, medicine may be prescribed in the last 4 weeks of the pregnancy. This can prevent a recurrent infection at the time of delivery.  RECOMMENDED DELIVERY METHOD If an active, recurrent infection is present at the time delivery, the baby should be delivered by cesarean delivery. This is because the virus can pass to the baby through an infected birth canal. This can cause severe problems for the baby. If the infection happens for the first time late in the pregnancy, the caregiver may also recommend a cesarean delivery. With a new infection, the body has not had the time to build up enough antibodies against the virus to protect the baby from getting the infection. Even if the birth canal does not have visible sores (lesions) from the herpes virus, there is still that chance that the virus can spread to the baby. A cesarean delivery should be done if there is any signs or feeling of an infection being present in the genital area. Cesarean delivery is not recommended for women with a history of herpes infection but no evidence of active genital lesions at the time of delivery. Lesions that have crusted fully are considered healed and not active. BREASTFEEDING  Women infected with genital herpes can breastfeed their baby. The virus will not be present in the breast milk. If lesions are present on the breast, the baby should not breastfeed from the affected breast(s). HOW TO PREVENT PASSING HERPES TO YOUR BABY AFTER DELIVERY  Wash your hands with soap and water often and before touching your baby.  If you have an outbreak, keep the area clean and covered.  Try to avoid physical and stressful situations that may bring on an outbreak. SEEK IMMEDIATE MEDICAL  CARE IF:   You have an outbreak during pregnancy and cannot urinate.  You have an outbreak anytime during your pregnancy and especially in the last 3 months of the pregnancy.  You think you are having an allergic reaction or side effects from the medicine you are taking. Document Released: 08/22/2000 Document Revised: 08/08/2011 Document Reviewed: 04/26/2011 [employer] Patient Information 2014 [employer].  

## 2013-01-15 NOTE — Progress Notes (Signed)
Stuffy nose, cough x 6 days. Started off as a sorethroat. ? losing mucus plug.

## 2013-01-15 NOTE — Progress Notes (Signed)
Reports good fm. Denies lof, vb, urinary frequency, urgency, hesitancy, or dysuria.  Mucousy d/c, nonodorous, no vulvovaginal itching/irritation. 3 'pains' in abdomen over the last week. Concerned she needs to come out of work. Spec exam: cx visually closed, small amount clear-whitish nonodorous d/c, SVE: LTC, high.  Reviewed PN2 results including +HSV2, ptl s/s, FKC. Note to decrease hrs at work per pt request.  All questions answered. F/U in 2wks for visit.

## 2013-01-24 ENCOUNTER — Telehealth: Payer: Self-pay | Admitting: *Deleted

## 2013-01-24 NOTE — Telephone Encounter (Signed)
C/o "really bad pains in pelvic area, lower abdomen, and lower back." + FM, no vaginal bleeding. Pt encouraged to push fluids, take tylenol. If no improvement call our office back. Pt verbalized understanding.

## 2013-01-30 ENCOUNTER — Ambulatory Visit (INDEPENDENT_AMBULATORY_CARE_PROVIDER_SITE_OTHER): Payer: Medicaid Other | Admitting: Advanced Practice Midwife

## 2013-01-30 ENCOUNTER — Encounter: Payer: Self-pay | Admitting: Advanced Practice Midwife

## 2013-01-30 VITALS — BP 120/80 | Wt 125.0 lb

## 2013-01-30 DIAGNOSIS — Z331 Pregnant state, incidental: Secondary | ICD-10-CM

## 2013-01-30 DIAGNOSIS — Z1389 Encounter for screening for other disorder: Secondary | ICD-10-CM

## 2013-01-30 DIAGNOSIS — O98519 Other viral diseases complicating pregnancy, unspecified trimester: Secondary | ICD-10-CM

## 2013-01-30 DIAGNOSIS — R768 Other specified abnormal immunological findings in serum: Secondary | ICD-10-CM

## 2013-01-30 DIAGNOSIS — O99019 Anemia complicating pregnancy, unspecified trimester: Secondary | ICD-10-CM

## 2013-01-30 LAB — POCT URINALYSIS DIPSTICK
Ketones, UA: NEGATIVE
Nitrite, UA: NEGATIVE
Protein, UA: NEGATIVE

## 2013-01-30 MED ORDER — ACYCLOVIR 400 MG PO TABS: 400.0000 mg | ORAL_TABLET | Freq: Three times a day (TID) | ORAL | Status: DC

## 2013-01-30 NOTE — Patient Instructions (Signed)

## 2013-01-30 NOTE — Progress Notes (Signed)
No c/o at this time except round ligament pain.  Routine questions about pregnancy answered.  F/U in 2 weeks for LROB.

## 2013-02-12 ENCOUNTER — Ambulatory Visit (INDEPENDENT_AMBULATORY_CARE_PROVIDER_SITE_OTHER): Payer: Medicaid Other | Admitting: Women's Health

## 2013-02-12 ENCOUNTER — Encounter: Payer: Self-pay | Admitting: Women's Health

## 2013-02-12 VITALS — BP 130/84 | Wt 126.8 lb

## 2013-02-12 DIAGNOSIS — O98519 Other viral diseases complicating pregnancy, unspecified trimester: Secondary | ICD-10-CM

## 2013-02-12 DIAGNOSIS — Z3403 Encounter for supervision of normal first pregnancy, third trimester: Secondary | ICD-10-CM

## 2013-02-12 DIAGNOSIS — R768 Other specified abnormal immunological findings in serum: Secondary | ICD-10-CM

## 2013-02-12 DIAGNOSIS — O99019 Anemia complicating pregnancy, unspecified trimester: Secondary | ICD-10-CM

## 2013-02-12 DIAGNOSIS — Z331 Pregnant state, incidental: Secondary | ICD-10-CM

## 2013-02-12 DIAGNOSIS — Z1389 Encounter for screening for other disorder: Secondary | ICD-10-CM

## 2013-02-12 LAB — POCT URINALYSIS DIPSTICK
Glucose, UA: NEGATIVE
Ketones, UA: NEGATIVE

## 2013-02-12 MED ORDER — ACYCLOVIR 400 MG PO TABS: 400.0000 mg | ORAL_TABLET | Freq: Three times a day (TID) | ORAL | Status: DC

## 2013-02-12 NOTE — Progress Notes (Signed)
Reports good fm. Denies uc's, lof, vb, urinary frequency, urgency, hesitancy, or dysuria.  Pelvic pressure, and legs give out at times. Requests SVE. SVE: 1.5/60/-1, vtxNeeds refill on acyclovir, e-scribed. Reviewed ptl s/s, fkc.  All questions answered. F/U in 1wk for visit.

## 2013-02-12 NOTE — Patient Instructions (Addendum)
[facility] [phone]          [facility] [phone]               [facility] [phone]   Preterm Labor Preterm labor is when labor starts at less than 37 weeks of pregnancy. The normal length of a pregnancy is 39 to 41 weeks. CAUSES Often, there is no identifiable underlying cause as to why a woman goes into preterm labor. However, one of the most common known causes of preterm labor is infection. Infections of the uterus, cervix, vagina, amniotic sac, bladder, kidney, or even the lungs (pneumonia) can cause labor to start. Other causes of preterm labor include:  Urogenital infections, such as yeast infections and bacterial vaginosis.  Uterine abnormalities (uterine shape, uterine septum, fibroids, bleeding from the placenta).  A cervix that has been operated on and opens prematurely.  Malformations in the baby.  Multiple gestations (twins, triplets, and so on).  Breakage of the amniotic sac. Additional risk factors for preterm labor include:  Previous history of preterm labor.  Premature rupture of membranes (PROM).  A placenta that covers the opening of the cervix (placenta previa).  A placenta that separates from the uterus (placenta abruption).  A cervix that is too weak to hold the baby in the uterus (incompetence cervix).  Having too much fluid in the amniotic sac (polyhydramnios).  Taking illegal drugs or smoking while pregnant.  Not gaining enough weight while pregnant.  Women younger than 18 and older than 21 years old.  Low socioeconomic status.  African-American ethnicity. SYMPTOMS Signs and symptoms of preterm labor include:  Menstrual-like cramps.  Contractions that are 30 to 70 seconds apart, become very regular, closer together, and are more intense and painful.  Contractions that start on the top of the uterus and spread down to the lower abdomen and back.  A sense of increased pelvic  pressure or back pain.  A watery or bloody discharge that comes from the vagina. DIAGNOSIS  A diagnosis can be confirmed by:  A vaginal exam.  An ultrasound of the cervix.  Sampling (swabbing) cervico-vaginal secretions. These samples can be tested for the presence of fetal fibronectin. This is a protein found in cervical discharge which is associated with preterm labor.  Fetal monitoring. TREATMENT  Depending on the length of the pregnancy and other circumstances, a caregiver may suggest bed rest. If necessary, there are medicines that can be given to stop contractions and to quicken fetal lung maturity. If labor happens before 34 weeks of pregnancy, a prolonged hospital stay may be recommended. Treatment depends on the condition of both the mother and baby. PREVENTION There are some things a mother can do to lower the risk of preterm labor in future pregnancies. A woman can:   Stop smoking.  Maintain healthy weight gain and avoid chemicals and drugs that are not necessary.  Be watchful for any type of infection.  Inform her caregiver if she has a known history of preterm labor. Document Released: 08/06/2003 Document Revised: 08/08/2011 Document Reviewed: 09/10/2010 [employer] Patient Information 2014 [employer].  

## 2013-02-20 ENCOUNTER — Encounter: Payer: Self-pay | Admitting: Advanced Practice Midwife

## 2013-02-20 ENCOUNTER — Ambulatory Visit (INDEPENDENT_AMBULATORY_CARE_PROVIDER_SITE_OTHER): Payer: Medicaid Other | Admitting: Advanced Practice Midwife

## 2013-02-20 VITALS — BP 126/54 | Wt 130.0 lb

## 2013-02-20 DIAGNOSIS — O2390 Unspecified genitourinary tract infection in pregnancy, unspecified trimester: Secondary | ICD-10-CM

## 2013-02-20 DIAGNOSIS — O99019 Anemia complicating pregnancy, unspecified trimester: Secondary | ICD-10-CM

## 2013-02-20 DIAGNOSIS — Z331 Pregnant state, incidental: Secondary | ICD-10-CM

## 2013-02-20 DIAGNOSIS — Z3403 Encounter for supervision of normal first pregnancy, third trimester: Secondary | ICD-10-CM

## 2013-02-20 DIAGNOSIS — Z1389 Encounter for screening for other disorder: Secondary | ICD-10-CM

## 2013-02-20 DIAGNOSIS — O98519 Other viral diseases complicating pregnancy, unspecified trimester: Secondary | ICD-10-CM

## 2013-02-20 LAB — POCT URINALYSIS DIPSTICK
Glucose, UA: NEGATIVE
Ketones, UA: NEGATIVE
Leukocytes, UA: NEGATIVE
Nitrite, UA: NEGATIVE

## 2013-02-20 LAB — OB RESULTS CONSOLE GBS: GBS: NEGATIVE

## 2013-02-20 NOTE — Progress Notes (Signed)
Watery vaginal discharge last pm. No odor.

## 2013-02-20 NOTE — Progress Notes (Signed)
GBS today.  C/O clear discharge yesterday, mucusy.  Had some vaginal irritation, better today.  SSE:  Normal appearing vaginal discharge, vaginal walls red. Flat wart  on L buttock treated with TCA 85%.  Wants to start maternity leave now until 6 weeks after delivery.

## 2013-02-21 LAB — GC/CHLAMYDIA PROBE AMP: GC Probe RNA: NEGATIVE

## 2013-02-23 LAB — CULTURE, BETA STREP (GROUP B ONLY)

## 2013-02-26 ENCOUNTER — Encounter: Payer: Self-pay | Admitting: Women's Health

## 2013-02-26 ENCOUNTER — Ambulatory Visit (INDEPENDENT_AMBULATORY_CARE_PROVIDER_SITE_OTHER): Payer: Medicaid Other | Admitting: Women's Health

## 2013-02-26 VITALS — BP 120/70 | Wt 128.0 lb

## 2013-02-26 DIAGNOSIS — O98519 Other viral diseases complicating pregnancy, unspecified trimester: Secondary | ICD-10-CM

## 2013-02-26 DIAGNOSIS — O99019 Anemia complicating pregnancy, unspecified trimester: Secondary | ICD-10-CM

## 2013-02-26 DIAGNOSIS — R768 Other specified abnormal immunological findings in serum: Secondary | ICD-10-CM

## 2013-02-26 DIAGNOSIS — Z3403 Encounter for supervision of normal first pregnancy, third trimester: Secondary | ICD-10-CM

## 2013-02-26 DIAGNOSIS — Z1389 Encounter for screening for other disorder: Secondary | ICD-10-CM

## 2013-02-26 DIAGNOSIS — Z331 Pregnant state, incidental: Secondary | ICD-10-CM

## 2013-02-26 LAB — POCT URINALYSIS DIPSTICK
Blood, UA: NEGATIVE
Leukocytes, UA: NEGATIVE
Nitrite, UA: NEGATIVE
Protein, UA: NEGATIVE

## 2013-02-26 MED ORDER — ACYCLOVIR 400 MG PO TABS: 400.0000 mg | ORAL_TABLET | Freq: Three times a day (TID) | ORAL | Status: DC

## 2013-02-26 NOTE — Progress Notes (Signed)
Reports good fm. Denies vb, urinary frequency, urgency, hesitancy, or dysuria.  Has been having intermittent uc's and lof at night. SSE: cx visually closed, no pooling of fluid, thin creamy white d/c, fern neg. Reviewed labor s/s, fkc.  All questions answered. F/U in 1wk for visit.

## 2013-02-26 NOTE — Progress Notes (Signed)
Pt here today for routine visit. Pt states that she is having some pain and pressure. Pt also states she is having some swelling in her hands as well. PT denies any other problem or concerns at this time.

## 2013-02-26 NOTE — Patient Instructions (Addendum)

## 2013-03-05 ENCOUNTER — Ambulatory Visit (INDEPENDENT_AMBULATORY_CARE_PROVIDER_SITE_OTHER): Payer: Medicaid Other | Admitting: Obstetrics & Gynecology

## 2013-03-05 ENCOUNTER — Encounter: Payer: Self-pay | Admitting: Obstetrics & Gynecology

## 2013-03-05 VITALS — BP 120/70 | Wt 133.0 lb

## 2013-03-05 DIAGNOSIS — O99019 Anemia complicating pregnancy, unspecified trimester: Secondary | ICD-10-CM

## 2013-03-05 DIAGNOSIS — Z3403 Encounter for supervision of normal first pregnancy, third trimester: Secondary | ICD-10-CM

## 2013-03-05 DIAGNOSIS — Z331 Pregnant state, incidental: Secondary | ICD-10-CM

## 2013-03-05 DIAGNOSIS — O98519 Other viral diseases complicating pregnancy, unspecified trimester: Secondary | ICD-10-CM

## 2013-03-05 DIAGNOSIS — Z1389 Encounter for screening for other disorder: Secondary | ICD-10-CM

## 2013-03-05 LAB — POCT URINALYSIS DIPSTICK
Blood, UA: NEGATIVE
Glucose, UA: NEGATIVE
Nitrite, UA: NEGATIVE

## 2013-03-05 NOTE — Progress Notes (Signed)
Reports good fetal movement.  Denies vaginal bleeding, loss of fluid, dysuria.  She continues to have intermittent contractions particularly at night.  Patient does report some pelvic pain and pressure. Cervical exam: 2-3/80/0.   Reviewed labor precautions.  Follow up in 1 week.

## 2013-03-05 NOTE — Progress Notes (Signed)
BP weight and urine results all reviewed and noted. Patient reports good fetal movement, denies any bleeding and no rupture of membranes symptoms or regular contractions. Patient is without complaints. All questions were answered.  

## 2013-03-05 NOTE — Progress Notes (Signed)
Having a lot of pain and pressure.

## 2013-03-05 NOTE — Addendum Note (Signed)
Addended by: Colen Darling on: 03/05/2013 03:22 PM   Modules accepted: Orders

## 2013-03-08 ENCOUNTER — Encounter (HOSPITAL_COMMUNITY): Payer: Medicaid Other | Admitting: Anesthesiology

## 2013-03-08 ENCOUNTER — Inpatient Hospital Stay (HOSPITAL_COMMUNITY)
Admission: AD | Admit: 2013-03-08 | Discharge: 2013-03-10 | DRG: 774 | Disposition: A | Discharge status: Home / Self Care | Payer: Medicaid Other | Source: Ambulatory Visit | Attending: Family Medicine | Admitting: Family Medicine

## 2013-03-08 ENCOUNTER — Inpatient Hospital Stay (HOSPITAL_COMMUNITY): Payer: Medicaid Other | Admitting: Anesthesiology

## 2013-03-08 ENCOUNTER — Encounter (HOSPITAL_COMMUNITY): Payer: Self-pay

## 2013-03-08 DIAGNOSIS — IMO0002 Reserved for concepts with insufficient information to code with codable children: Principal | ICD-10-CM | POA: Diagnosis not present

## 2013-03-08 DIAGNOSIS — IMO0001 Reserved for inherently not codable concepts without codable children: Secondary | ICD-10-CM | POA: Clinically undetermined

## 2013-03-08 LAB — CBC
HCT: 35.1 % — ABNORMAL LOW (ref 36.0–46.0)
HCT: 36.4 % (ref 36.0–46.0)
Hemoglobin: 12.3 g/dL (ref 12.0–15.0)
Hemoglobin: 12.7 g/dL (ref 12.0–15.0)
MCHC: 35 g/dL (ref 30.0–36.0)
MCHC: 34.9 g/dL (ref 30.0–36.0)
MCV: 91.6 fL (ref 78.0–100.0)
Platelets: 247 10*3/uL (ref 150–400)
Platelets: 239 10*3/uL (ref 150–400)
RBC: 3.83 MIL/uL — ABNORMAL LOW (ref 3.87–5.11)
RBC: 3.95 MIL/uL (ref 3.87–5.11)
WBC: 6 10*3/uL (ref 4.0–10.5)

## 2013-03-08 LAB — COMPREHENSIVE METABOLIC PANEL
ALT: 13 U/L (ref 0–35)
AST: 25 U/L (ref 0–37)
Alkaline Phosphatase: 289 U/L — ABNORMAL HIGH (ref 39–117)
CO2: 25 mEq/L (ref 19–32)
Calcium: 9.4 mg/dL (ref 8.4–10.5)
Chloride: 105 mEq/L (ref 96–112)
GFR calc Af Amer: >90 mL/min (ref >90)
GFR calc non Af Amer: >90 mL/min (ref >90)
Glucose, Bld: 88 mg/dL (ref 70–99)
Potassium: 3.6 mEq/L (ref 3.5–5.1)
Sodium: 140 mEq/L (ref 135–145)
Total Bilirubin: 0.3 mg/dL (ref 0.3–1.2)

## 2013-03-08 LAB — PROTEIN / CREATININE RATIO, URINE: Protein Creatinine Ratio: 0.44 — ABNORMAL HIGH (ref 0.00–0.15)

## 2013-03-08 LAB — MRSA PCR SCREENING: MRSA by PCR: NEGATIVE

## 2013-03-08 MED ORDER — ZOLPIDEM TARTRATE 5 MG PO TABS: 5.0000 mg | ORAL_TABLET | Freq: Every evening | ORAL | Status: DC | PRN

## 2013-03-08 MED ORDER — ACETAMINOPHEN 325 MG PO TABS: 650.0000 mg | ORAL_TABLET | ORAL | Status: DC | PRN

## 2013-03-08 MED ORDER — LACTATED RINGERS IV SOLN
INTRAVENOUS | Status: AC
  Administered 2013-03-08 – 2013-03-09 (×3): via INTRAVENOUS

## 2013-03-08 MED ORDER — WITCH HAZEL-GLYCERIN EX PADS: 1.0000 "application " | MEDICATED_PAD | CUTANEOUS | Status: DC | PRN

## 2013-03-08 MED ORDER — MAGNESIUM SULFATE BOLUS VIA INFUSION
4.0000 g | Freq: Once | INTRAVENOUS | Status: AC
  Administered 2013-03-08: 4 g via INTRAVENOUS
  Filled 2013-03-08: qty 500

## 2013-03-08 MED ORDER — DIPHENHYDRAMINE HCL 25 MG PO CAPS: 25.0000 mg | ORAL_CAPSULE | Freq: Four times a day (QID) | ORAL | Status: DC | PRN

## 2013-03-08 MED ORDER — ACYCLOVIR 400 MG PO TABS
400.0000 mg | ORAL_TABLET | Freq: Three times a day (TID) | ORAL | Status: DC
  Filled 2013-03-08 (×4): qty 1

## 2013-03-08 MED ORDER — TETANUS-DIPHTH-ACELL PERTUSSIS 5-2.5-18.5 LF-MCG/0.5 IM SUSP
0.5000 mL | Freq: Once | INTRAMUSCULAR | Status: AC
  Administered 2013-03-09: 0.5 mL via INTRAMUSCULAR
  Filled 2013-03-08 (×2): qty 0.5

## 2013-03-08 MED ORDER — LACTATED RINGERS IV SOLN: 500.0000 mL | INTRAVENOUS | Status: DC | PRN

## 2013-03-08 MED ORDER — OXYTOCIN 40 UNITS IN LACTATED RINGERS INFUSION - SIMPLE MED
62.5000 mL/h | INTRAVENOUS | Status: DC
  Filled 2013-03-08: qty 1000

## 2013-03-08 MED ORDER — ONDANSETRON HCL 4 MG/2ML IJ SOLN: 4.0000 mg | Freq: Four times a day (QID) | INTRAMUSCULAR | Status: DC | PRN

## 2013-03-08 MED ORDER — FENTANYL 2.5 MCG/ML BUPIVACAINE 1/10 % EPIDURAL INFUSION (WH - ANES)
14.0000 mL/h | INTRAMUSCULAR | Status: DC | PRN
  Filled 2013-03-08: qty 125

## 2013-03-08 MED ORDER — EPHEDRINE 5 MG/ML INJ
10.0000 mg | INTRAVENOUS | Status: DC | PRN
  Filled 2013-03-08: qty 2

## 2013-03-08 MED ORDER — PHENYLEPHRINE 40 MCG/ML (10ML) SYRINGE FOR IV PUSH (FOR BLOOD PRESSURE SUPPORT)
80.0000 ug | PREFILLED_SYRINGE | INTRAVENOUS | Status: DC | PRN
  Filled 2013-03-08: qty 2

## 2013-03-08 MED ORDER — LACTATED RINGERS IV SOLN
INTRAVENOUS | Status: DC
  Administered 2013-03-08: 04:00:00 via INTRAVENOUS

## 2013-03-08 MED ORDER — ONDANSETRON HCL 4 MG PO TABS: 4.0000 mg | ORAL_TABLET | ORAL | Status: DC | PRN

## 2013-03-08 MED ORDER — OXYCODONE-ACETAMINOPHEN 5-325 MG PO TABS: 1.0000 | ORAL_TABLET | ORAL | Status: DC | PRN

## 2013-03-08 MED ORDER — LANOLIN HYDROUS EX OINT: TOPICAL_OINTMENT | CUTANEOUS | Status: DC | PRN

## 2013-03-08 MED ORDER — PRENATAL MULTIVITAMIN CH
1.0000 | ORAL_TABLET | Freq: Every day | ORAL | Status: DC
  Administered 2013-03-09 – 2013-03-10 (×2): 1 via ORAL
  Filled 2013-03-08 (×2): qty 1

## 2013-03-08 MED ORDER — LACTATED RINGERS IV SOLN
500.0000 mL | Freq: Once | INTRAVENOUS | Status: AC
  Administered 2013-03-08: 500 mL via INTRAVENOUS

## 2013-03-08 MED ORDER — FLEET ENEMA 7-19 GM/118ML RE ENEM: 1.0000 | ENEMA | RECTAL | Status: DC | PRN

## 2013-03-08 MED ORDER — LABETALOL HCL 5 MG/ML IV SOLN
10.0000 mg | Freq: Once | INTRAVENOUS | Status: AC | PRN
  Administered 2013-03-08: 10 mg via INTRAVENOUS
  Filled 2013-03-08: qty 4

## 2013-03-08 MED ORDER — IBUPROFEN 600 MG PO TABS: 600.0000 mg | ORAL_TABLET | Freq: Four times a day (QID) | ORAL | Status: DC | PRN

## 2013-03-08 MED ORDER — BENZOCAINE-MENTHOL 20-0.5 % EX AERO
1.0000 "application " | INHALATION_SPRAY | CUTANEOUS | Status: DC | PRN
  Administered 2013-03-08: 1 via TOPICAL
  Filled 2013-03-08: qty 56

## 2013-03-08 MED ORDER — FENTANYL CITRATE 0.05 MG/ML IJ SOLN: 100.0000 ug | INTRAMUSCULAR | Status: DC | PRN

## 2013-03-08 MED ORDER — OXYTOCIN BOLUS FROM INFUSION
500.0000 mL | INTRAVENOUS | Status: DC
  Administered 2013-03-08: 500 mL via INTRAVENOUS

## 2013-03-08 MED ORDER — SIMETHICONE 80 MG PO CHEW: 80.0000 mg | CHEWABLE_TABLET | ORAL | Status: DC | PRN

## 2013-03-08 MED ORDER — ONDANSETRON HCL 4 MG/2ML IJ SOLN: 4.0000 mg | INTRAMUSCULAR | Status: DC | PRN

## 2013-03-08 MED ORDER — DIPHENHYDRAMINE HCL 50 MG/ML IJ SOLN
12.5000 mg | INTRAMUSCULAR | Status: DC | PRN
  Administered 2013-03-08: 12.5 mg via INTRAVENOUS
  Filled 2013-03-08: qty 1

## 2013-03-08 MED ORDER — DIBUCAINE 1 % RE OINT: 1.0000 "application " | TOPICAL_OINTMENT | RECTAL | Status: DC | PRN

## 2013-03-08 MED ORDER — IBUPROFEN 600 MG PO TABS
600.0000 mg | ORAL_TABLET | Freq: Four times a day (QID) | ORAL | Status: DC
  Administered 2013-03-08 – 2013-03-10 (×9): 600 mg via ORAL
  Filled 2013-03-08 (×9): qty 1

## 2013-03-08 MED ORDER — CITRIC ACID-SODIUM CITRATE 334-500 MG/5ML PO SOLN: 30.0000 mL | ORAL | Status: DC | PRN

## 2013-03-08 MED ORDER — SENNOSIDES-DOCUSATE SODIUM 8.6-50 MG PO TABS
2.0000 | ORAL_TABLET | ORAL | Status: DC
  Administered 2013-03-09 – 2013-03-10 (×2): 2 via ORAL
  Filled 2013-03-08 (×2): qty 2

## 2013-03-08 MED ORDER — LIDOCAINE HCL (PF) 1 % IJ SOLN
30.0000 mL | INTRAMUSCULAR | Status: DC | PRN
  Filled 2013-03-08: qty 30

## 2013-03-08 MED ORDER — MAGNESIUM SULFATE 40 G IN LACTATED RINGERS - SIMPLE
2.0000 g/h | INTRAVENOUS | Status: AC
  Administered 2013-03-08 – 2013-03-09 (×2): 2 g/h via INTRAVENOUS
  Filled 2013-03-08 (×2): qty 500

## 2013-03-08 MED ORDER — PHENYLEPHRINE 40 MCG/ML (10ML) SYRINGE FOR IV PUSH (FOR BLOOD PRESSURE SUPPORT)
80.0000 ug | PREFILLED_SYRINGE | INTRAVENOUS | Status: DC | PRN
  Filled 2013-03-08: qty 5
  Filled 2013-03-08: qty 2

## 2013-03-08 MED ORDER — OXYCODONE-ACETAMINOPHEN 5-325 MG PO TABS
1.0000 | ORAL_TABLET | ORAL | Status: DC | PRN
  Administered 2013-03-08 – 2013-03-10 (×7): 1 via ORAL
  Filled 2013-03-08 (×7): qty 1

## 2013-03-08 MED ORDER — FENTANYL 2.5 MCG/ML BUPIVACAINE 1/10 % EPIDURAL INFUSION (WH - ANES)
INTRAMUSCULAR | Status: DC | PRN
  Administered 2013-03-08: 14 mL/h via EPIDURAL

## 2013-03-08 MED ORDER — EPHEDRINE 5 MG/ML INJ
10.0000 mg | INTRAVENOUS | Status: DC | PRN
  Filled 2013-03-08 (×2): qty 4
  Filled 2013-03-08: qty 2

## 2013-03-08 MED ORDER — LIDOCAINE HCL (PF) 1 % IJ SOLN
INTRAMUSCULAR | Status: DC | PRN
  Administered 2013-03-08 (×2): 5 mL

## 2013-03-08 NOTE — Progress Notes (Signed)
   Melanie Gonzales is a 22 y.o. G1P0 at [redacted]w[redacted]d  admitted for active labor  Subjective: Comfortable with epidural.    Objective: BP 123/81  Pulse 90  Temp(Src) 98.1 F (36.7 C) (Oral)  Resp 20  Ht 5\' 6"  (1.676 m)  Wt 60.782 kg (134 lb)  BMI 21.64 kg/m2  SpO2 96%    FHT:  FHR: 120 bpm, variability: moderate,  accelerations:  Present,  decelerations:  Absent UC:   regular, every 1-2 minutes SVE:   Dilation: 10 Effacement (%): 100 Station: 0 Exam by:: Kenuff Labs: Lab Results  Component Value Date   WBC 6.0 03/08/2013   HGB 12.3 03/08/2013   HCT 35.1* 03/08/2013   MCV 91.6 03/08/2013   PLT 247 03/08/2013    Assessment / Plan: Spontaneous labor, progressing normally SROM clear fluid Labor: Progressing normally Fetal Wellbeing:  Category I Pain Control:  Epidural Anticipated MOD:  NSVD  Kuneff, Renee 03/08/2013, 9:44 AM  Evaluation and management procedures were performed by Resident physician under my supervision/collaboration. Chart reviewed, patient examined by me and I agree with management and plan. Danae Orleans, CNM 03/08/2013 10:41 AM

## 2013-03-08 NOTE — MAU Note (Signed)
PT HAS ARRIVED VIA EMS   .  [facility] Tuesday- VE  3-4 CM.     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

## 2013-03-08 NOTE — Anesthesia Procedure Notes (Signed)
Epidural Patient location during procedure: OB Start time: 03/08/2013 4:51 AM  Staffing Anesthesiologist: Angus Seller., Harrell Gave. Performed by: anesthesiologist   Preanesthetic Checklist Completed: patient identified, site marked, surgical consent, pre-op evaluation, timeout performed, IV checked, risks and benefits discussed and monitors and equipment checked  Epidural Patient position: sitting Prep: site prepped and draped and DuraPrep Patient monitoring: continuous pulse ox and blood pressure Approach: midline Injection technique: LOR air  Needle:  Needle type: Tuohy  Needle gauge: 17 G Needle length: 9 cm and 9 Needle insertion depth: 5 cm cm Catheter type: closed end flexible Catheter size: 19 Gauge Catheter at skin depth: 10 cm Test dose: negative  Assessment Events: blood not aspirated, injection not painful, no injection resistance, negative IV test and no paresthesia  Additional Notes Patient identified.  Risk benefits discussed including failed block, incomplete pain control, headache, nerve damage, paralysis, blood pressure changes, nausea, vomiting, reactions to medication both toxic or allergic, and postpartum back pain.  Patient expressed understanding and wished to proceed.  All questions were answered.  Sterile technique used throughout procedure and epidural site dressed with sterile barrier dressing. No paresthesia or other complications noted.The patient did not experience any signs of intravascular injection such as tinnitus or metallic taste in mouth nor signs of intrathecal spread such as rapid motor block. Please see nursing notes for vital signs.

## 2013-03-08 NOTE — Plan of Care (Signed)
Problem: Phase I Progression Outcomes Goal: Pain controlled with appropriate interventions Outcome: Completed/Met Date Met:  03/08/13 Taking ATC Motrin and so far has adequate pain control Goal: Voiding adequately Outcome: Completed/Met Date Met:  03/08/13 Had to be I&O cathed x 1 but now is voiding large amounts of amber colored urine Goal: VS, stable, temp < 100.4 degrees F Outcome: Not Met (add Reason) Patient placed on Magnesium drip due to Bp elevations

## 2013-03-08 NOTE — Progress Notes (Signed)
   Ethyl Vila is a 22 y.o. G1P0 at [redacted]w[redacted]d  admitted for active labor  Subjective:  Comfortable with epidural.  Wants to wait for mom to get her before AROM Objective: BP 117/76  Pulse 81  Temp(Src) 97.9 F (36.6 C) (Oral)  Resp 18  Ht 5\' 6"  (1.676 m)  Wt 60.782 kg (134 lb)  BMI 21.64 kg/m2  SpO2 96%    FHT:  FHR: 120 bpm, variability: moderate,  accelerations:  Present,  decelerations:  Absent UC:   regular, every 2 minutes SVE:   Dilation: 7.5 Effacement (%): 80 Station: 0 Exam by:: S Nix RN Labs: Lab Results  Component Value Date   WBC 6.0 03/08/2013   HGB 12.3 03/08/2013   HCT 35.1* 03/08/2013   MCV 91.6 03/08/2013   PLT 247 03/08/2013    Assessment / Plan: Spontaneous labor, progressing normally  Labor: Progressing normally Fetal Wellbeing:  Category I Pain Control:  Epidural Anticipated MOD:  NSVD  CRESENZO-DISHMAN,Saul Dorsi 03/08/2013, 7:04 AM

## 2013-03-08 NOTE — Anesthesia Preprocedure Evaluation (Signed)

## 2013-03-08 NOTE — Anesthesia Postprocedure Evaluation (Signed)
Anesthesia Post Note  Patient: Melanie Gonzales  Procedure(s) Performed: * No procedures listed *  Anesthesia type: Epidural  Patient location: Mother/Baby  Post pain: Pain level controlled  Post assessment: Post-op Vital signs reviewed  Last Vitals:  Filed Vitals:   03/08/13 1557  BP: 144/106  Pulse: 77  Temp:   Resp:     Post vital signs: Reviewed  Level of consciousness:alert  Complications: No apparent anesthesia complications

## 2013-03-08 NOTE — H&P (Signed)
Chart reviewed and agree with management and plan.  

## 2013-03-08 NOTE — Progress Notes (Signed)
Interim note:  S: Paged by nursing patient was experiencing some elevated BP readings x3. She had one elevated pressure at the end of her delivery. On admission to mother baby her BP was 138/98 and she was comfortable at that time. Most recent pressure is 155/99 and patient is extremely uncomfortable and cramping. She has not had anything for pain as of yet, d/t her having an empty stomach. She reports the pain is worse with breast feeding. After delivery she did have increased bleeding x1 and trailing membranes were found by nursing and removed. After which, her bleeding was normal. Upon arrival to [facility] she had one episode of increased bleeding, but since has been minimal. Patient denies headache, RUQ, lightheadedness or vision changes.   O: BP 155/99  Pulse 64  Temp(Src) 98.4 F (36.9 C) (Axillary)  Resp 18  Ht 5\' 6"  (1.676 m)  Wt 60.782 kg (134 lb)  BMI 21.64 kg/m2  SpO2 99% Gen: Pt appears in pain, sitting up in bed. CV: RRR ABD: Umbilicus -1 and firm.  A/P: Postpartum Day #0 - Monitor bleeding closely - Monitor blood pressure closely; labetalol PRN x1 ordered with parameters of >160/100, will call to MD if used. - Stat CBC, CMP and urine protein:creatinine (cath).  Will start Mag if signs of toxemia present  Felix Pacini DO  Evaluation and management procedures were performed by Resident physician under my supervision/collaboration. Chart reviewed, patient examined by me and I agree with management and plan. Results for orders placed during the hospital encounter of 03/08/13 (from the past 24 hour(s))  CBC     Status: Abnormal   Collection Time    03/08/13  4:00 AM      Result Value Range   WBC 6.0  4.0 - 10.5 K/uL   RBC 3.83 (*) 3.87 - 5.11 MIL/uL   Hemoglobin 12.3  12.0 - 15.0 g/dL   HCT 91.4 (*) 78.2 - 95.6 %   MCV 91.6  78.0 - 100.0 fL   MCH 32.1  26.0 - 34.0 pg   MCHC 35.0  30.0 - 36.0 g/dL   RDW 21.3  08.6 - 57.8 %   Platelets 247  150 - 400 K/uL  RPR     Status: None    Collection Time    03/08/13  4:00 AM      Result Value Range   RPR NON REACTIVE  NON REACTIVE  CBC     Status: None   Collection Time    03/08/13  2:55 PM      Result Value Range   WBC 10.0  4.0 - 10.5 K/uL   RBC 3.95  3.87 - 5.11 MIL/uL   Hemoglobin 12.7  12.0 - 15.0 g/dL   HCT 46.9  62.9 - 52.8 %   MCV 92.2  78.0 - 100.0 fL   MCH 32.2  26.0 - 34.0 pg   MCHC 34.9  30.0 - 36.0 g/dL   RDW 41.3  24.4 - 01.0 %   Platelets 239  150 - 400 K/uL  COMPREHENSIVE METABOLIC PANEL     Status: Abnormal   Collection Time    03/08/13  2:55 PM      Result Value Range   Sodium 140  135 - 145 mEq/L   Potassium 3.6  3.5 - 5.1 mEq/L   Chloride 105  96 - 112 mEq/L   CO2 25  19 - 32 mEq/L   Glucose, Bld 88  70 - 99 mg/dL   BUN 5 (*)  6 - 23 mg/dL   Creatinine, Ser 4.54  0.50 - 1.10 mg/dL   Calcium 9.4  8.4 - 09.8 mg/dL   Total Protein 6.6  6.0 - 8.3 g/dL   Albumin 2.7 (*) 3.5 - 5.2 g/dL   AST 25  0 - 37 U/L   ALT 13  0 - 35 U/L   Alkaline Phosphatase 289 (*) 39 - 117 U/L   Total Bilirubin 0.3  0.3 - 1.2 mg/dL   GFR calc non Af Amer >90  >90 mL/min   GFR calc Af Amer >90  >90 mL/min

## 2013-03-08 NOTE — H&P (Signed)
Melanie Gonzales is a 22 y.o. female G1P0 with IUP at 109w1d presenting for contractions/abdominal discomfort. Pt states she has been having regular, every 5-10 minutes contractions lasting 2 mins in duration, not associated with  vaginal bleeding.  Membranes are intact, with active fetal movement.   PNCare at [facility] since 12wks. Went to [facility] on Tuesday where she was 3-4cm. No complications with pregnancy. 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 Has been otherwise healthy. No hematuria/dysuria/bowel or bladder problems   Prenatal History/Complications:  Past Medical History: Past Medical History  Diagnosis Date  . Hx of migraines 08/13/2012    Past Surgical History: Past Surgical History  Procedure Laterality Date  . No past surgeries      Obstetrical History: OB History   Grav Para Term Preterm Abortions TAB SAB Ect Mult Living   1              1. Current  Gynecological History: Pertinent Gynecological History: Menses: flow is moderate, regular  Social History: History   Social History  . Marital Status: Single    Spouse Name: N/A    Number of Children: N/A  . Years of Education: N/A   Social History Main Topics  . Smoking status: Former Smoker    Types: Cigars  . Smokeless tobacco: Never Used  . Alcohol Use: No  . Drug Use: No  . Sexual Activity: Not Currently    Birth Control/ Protection: None   Other Topics Concern  . None   Social History Narrative  . None    Family History: Family History  Problem Relation Age of Onset  . Deep vein thrombosis Father   . Hypertension Paternal Grandmother   . Hypertension Maternal Grandmother   . Thyroid disease Maternal Grandmother     thyroid remove    Allergies: Allergies  Allergen Reactions  . Hydrocodone Itching  . Penicillins     Prescriptions prior to admission  Medication Sig Dispense Refill  . acyclovir (ZOVIRAX) 400 MG tablet Take 1 tablet (400 mg total) by mouth 3  (three) times daily.  90 tablet  2  . PRENATAL VITAMINS PO Take 1 tablet by mouth daily.         Review of Systems   Constitutional: Negative for fever, chills, weight loss, malaise/fatigue and diaphoresis.  HENT: Negative for hearing loss, ear pain, nosebleeds, congestion, sore throat, neck pain, tinnitus and ear discharge.   Eyes: Negative for blurred vision, double vision, photophobia, pain, discharge and redness.  Respiratory: Negative for cough, hemoptysis, sputum production, shortness of breath, wheezing and stridor.   Cardiovascular: Negative for chest pain, palpitations, orthopnea,  leg swelling  Gastrointestinal: Positive for abdominal pain. Negative for heartburn, nausea, vomiting, diarrhea, constipation, blood in stool Genitourinary: Negative for dysuria, urgency, frequency, hematuria and flank pain.  Musculoskeletal: Negative for myalgias, back pain, joint pain and falls.  Skin: Negative for itching and rash.  Neurological: Negative for dizziness, tingling, tremors, sensory change, speech change, focal weakness, seizures, loss of consciousness, weakness and headaches.  Endo/Heme/Allergies: Negative for environmental allergies and polydipsia. Does not bruise/bleed easily.  Psychiatric/Behavioral: Negative for depression, suicidal ideas, hallucinations, memory loss and substance abuse. The patient is not nervous/anxious and does not have insomnia.       There were no vitals taken for this visit. General appearance: alert, cooperative and no distress, in discomfort Lungs: clear to auscultation bilaterally Heart: regular rate and rhythm Abdomen: soft, non-tender; bowel sounds normal Pelvic: 7 per  RN Stanton Kidney Extremities: Homans sign is negative, no sign of DVT DTR's nml Presentation: cephalic Fetal monitoringBaseline: 120 bpm, Variability: Good {> 6 bpm), Accelerations: Reactive and Decelerations: Absent Uterine activity contractions every 2-3 mins Dilation: 7   Prenatal  labs: ABO, Rh: AB/Positive/-- (03/10 0000) Antibody: NEG (07/22 0915) Rubella:   RPR: NON REAC (07/22 0915)  HBsAg: Negative (03/10 0000)  HIV: NON REACTIVE (07/22 0915)  GBS:   neg 1 hr Glucola nml Genetic screening  neg Anatomy US nml   Assessment: Melanie Gonzales is a 22 y.o. G1P0 with an IUP at [redacted]w[redacted]d presenting for contractions  Plan: 1. Active labor at term- cervical change from 3-7 -routine orders -HIV NR, GBS neg -post HSV- on valtrex for suppression -may have epidural, fentanyl for pain -expect NSVD  2. FHR- tracing reassuring -cat I  3. Post-partum- follows with [facility] - breast feeding, depo   Anselm Lis, MD 03/08/2013, 4:01 AM  I have seen and examined this patient and agree the above assessment. CRESENZO-DISHMAN,Montrell Cessna 03/08/2013 9:03 AM

## 2013-03-08 NOTE — Progress Notes (Signed)
UR completed 

## 2013-03-09 MED ORDER — SODIUM CHLORIDE 0.9 % IJ SOLN
3.0000 mL | Freq: Two times a day (BID) | INTRAMUSCULAR | Status: DC
  Administered 2013-03-09: 3 mL via INTRAVENOUS

## 2013-03-09 MED ORDER — LABETALOL HCL 5 MG/ML IV SOLN: 10.0000 mg | INTRAVENOUS | Status: DC | PRN

## 2013-03-09 MED ORDER — SODIUM CHLORIDE 0.9 % IV SOLN: 250.0000 mL | INTRAVENOUS | Status: DC | PRN

## 2013-03-09 MED ORDER — SODIUM CHLORIDE 0.9 % IJ SOLN: 3.0000 mL | INTRAMUSCULAR | Status: DC | PRN

## 2013-03-09 NOTE — Lactation Note (Addendum)
This note was copied from the chart of Melanie Caralee Morea. Lactation Consultation Note:Initial visit with mom who had the baby latched to breast when I went into room. Reports that baby has been nursing well- feels just tugging no pain. LS 9-10 by RN. No questions at present. BF brochure given with resources for support after DC. To call prn  Patient Name: Melanie Gonzales HQION'G Date: 03/09/2013 Reason for consult: Initial assessment   Maternal Data Formula Feeding for Exclusion: Yes Reason for exclusion: Admission to Intensive Care Unit (ICU) post-partum Infant to breast within first hour of birth: Yes Does the patient have breastfeeding experience prior to this delivery?: No  Feeding   LATCH Score/Interventions     Lactation Tools Discussed/Used     Consult Status Consult Status: Follow-up Date: 03/10/13 Follow-up type: In-patient    Pamelia Hoit 03/09/2013, 9:27 AM

## 2013-03-09 NOTE — Progress Notes (Signed)
Post Partum Day 1 Subjective: no complaints, voiding and tolerating PO  Objective:  Intake/Output Summary (Last 24 hours) at 03/09/13 0727 Last data filed at 03/09/13 4098  Gross per 24 hour  Intake 2049.16 ml  Output   5125 ml  Net -3075.84 ml    Filed Vitals:   03/09/13 0400 03/09/13 0500 03/09/13 0600 03/09/13 0700  BP: 131/99 151/113 145/102 131/96  Pulse: 71 90 88 90  Temp:   97.7 F (36.5 C)   TempSrc:   Oral   Resp: 16 18 16 16   Height:      Weight:      SpO2: 99% 99% 100% 91%    Blood pressure 131/96, pulse 90, temperature 97.7 F (36.5 C), temperature source Oral, resp. rate 16, height 5\' 6"  (1.676 m), weight 134 lb (60.782 kg), SpO2 91.00%, unknown if currently breastfeeding.  Physical Exam:  General: alert, cooperative and no distress Lochia: appropriate Uterine Fundus: firm Incision: na DVT Evaluation: No evidence of DVT seen on physical exam.   Recent Labs  03/08/13 0400 03/08/13 1455  HGB 12.3 12.7  HCT 35.1* 36.4    Assessment/Plan: PPD#1 with PP pre eclampsia:  Excellent diuresis, BP a bit labile, prn meds ordered Cont Magnesium for 24 hours Consider daily BP meds if remains elevated   LOS: 1 day   Ashaun Gaughan H 03/09/2013, 7:26 AM

## 2013-03-10 ENCOUNTER — Encounter (HOSPITAL_COMMUNITY): Payer: Self-pay | Admitting: *Deleted

## 2013-03-10 MED ORDER — OXYCODONE-ACETAMINOPHEN 5-325 MG PO TABS: 1.0000 | ORAL_TABLET | Freq: Four times a day (QID) | ORAL | Status: DC | PRN

## 2013-03-10 MED ORDER — DOCUSATE SODIUM 100 MG PO CAPS: 100.0000 mg | ORAL_CAPSULE | Freq: Two times a day (BID) | ORAL | Status: DC | PRN

## 2013-03-10 MED ORDER — IBUPROFEN 600 MG PO TABS: 600.0000 mg | ORAL_TABLET | Freq: Four times a day (QID) | ORAL | Status: DC

## 2013-03-10 MED ORDER — INFLUENZA VAC SPLIT QUAD 0.5 ML IM SUSP
0.5000 mL | Freq: Once | INTRAMUSCULAR | Status: AC
  Administered 2013-03-10: 0.5 mL via INTRAMUSCULAR
  Filled 2013-03-10: qty 0.5

## 2013-03-10 NOTE — Discharge Summary (Signed)
Obstetric Discharge Summary Reason for Admission: onset of labor Prenatal Procedures: NST and Preeclampsia Intrapartum Procedures: spontaneous vaginal delivery Postpartum Procedures: magnesium sulfate eclampsia prophylaxis Complications-Operative and Postpartum: preeclampsia  Brief Hospital Course: 22 y.o. G1P1001 was admitted for labor at [redacted]w[redacted]d and had elevated blood pressures during labor. Labs showed urine PR:CR of 0.44. Given the diagnosis of preeclampsia, she was treated with magnesium sulfate for eclampsia prophylaxis for 24 hours postpartum, after a vaginal delivery.   BP was controlled and patient did not require any antihypertensive medications after delivery.  She had an uncomplicated postpartum course and was discharged to homed on PPD#2. She will follow up in one week at [facility] for BP check and routine postpartum care.  She is breastfeeding, plans to use Depo Provera for contraception.  Labs: Results for orders placed during the hospital encounter of 03/08/13 (from the past 72 hour(s))  CBC     Status: Abnormal   Collection Time    03/08/13  4:00 AM      Result Value Range   WBC 6.0  4.0 - 10.5 K/uL   RBC 3.83 (*) 3.87 - 5.11 MIL/uL   Hemoglobin 12.3  12.0 - 15.0 g/dL   HCT 14.7 (*) 82.9 - 56.2 %   MCV 91.6  78.0 - 100.0 fL   MCH 32.1  26.0 - 34.0 pg   MCHC 35.0  30.0 - 36.0 g/dL   RDW 13.0  86.5 - 78.4 %   Platelets 247  150 - 400 K/uL  RPR     Status: None   Collection Time    03/08/13  4:00 AM      Result Value Range   RPR NON REACTIVE  NON REACTIVE   Comment: Performed at [facility]  PROTEIN / CREATININE RATIO, URINE     Status: Abnormal   Collection Time    03/08/13  2:40 PM      Result Value Range   Creatinine, Urine 15.11     Total Protein, Urine 6.7     Comment: NO NORMAL RANGE ESTABLISHED FOR THIS TEST   PROTEIN CREATININE RATIO 0.44 (*) 0.00 - 0.15   Comment: Performed at [facility]  CBC     Status: None   Collection Time   03/08/13  2:55 PM      Result Value Range   WBC 10.0  4.0 - 10.5 K/uL   RBC 3.95  3.87 - 5.11 MIL/uL   Hemoglobin 12.7  12.0 - 15.0 g/dL   HCT 69.6  29.5 - 28.4 %   MCV 92.2  78.0 - 100.0 fL   MCH 32.2  26.0 - 34.0 pg   MCHC 34.9  30.0 - 36.0 g/dL   RDW 13.2  44.0 - 10.2 %   Platelets 239  150 - 400 K/uL  COMPREHENSIVE METABOLIC PANEL     Status: Abnormal   Collection Time    03/08/13  2:55 PM      Result Value Range   Sodium 140  135 - 145 mEq/L   Potassium 3.6  3.5 - 5.1 mEq/L   Chloride 105  96 - 112 mEq/L   CO2 25  19 - 32 mEq/L   Glucose, Bld 88  70 - 99 mg/dL   BUN 5 (*) 6 - 23 mg/dL   Creatinine, Ser 7.25  0.50 - 1.10 mg/dL   Calcium 9.4  8.4 - 36.6 mg/dL   Total Protein 6.6  6.0 - 8.3 g/dL   Albumin 2.7 (*)  3.5 - 5.2 g/dL   AST 25  0 - 37 U/L   ALT 13  0 - 35 U/L   Alkaline Phosphatase 289 (*) 39 - 117 U/L   Total Bilirubin 0.3  0.3 - 1.2 mg/dL   GFR calc non Af Amer >90  >90 mL/min   GFR calc Af Amer >90  >90 mL/min   Comment: (NOTE)     The eGFR has been calculated using the CKD EPI equation.     This calculation has not been validated in all clinical situations.     eGFR's persistently <90 mL/min signify possible Chronic Kidney     Disease.  MRSA PCR SCREENING     Status: None   Collection Time    03/08/13  8:15 PM      Result Value Range   MRSA by PCR NEGATIVE  NEGATIVE   Comment:            The GeneXpert MRSA Assay (FDA     approved for NASAL specimens     only), is one component of a     comprehensive MRSA colonization     surveillance program. It is not     intended to diagnose MRSA     infection nor to guide or     monitor treatment for     MRSA infections.    Physical Exam:  BP 126/74  Pulse 94  Temp(Src) 98.3 F (36.8 C) (Oral)  Resp 18  Ht 5\' 6"  (1.676 m)  Wt 121 lb 11.2 oz (55.203 kg)  BMI 19.65 kg/m2  SpO2 100% Temp:  [97.4 F (36.3 C)-98.4 F (36.9 C)] 98.3 F (36.8 C) (10/12 0355) Pulse Rate:  [71-97] 94 (10/12 0300) Resp:   [16-18] 18 (10/12 0000) BP: (120-149)/(74-108) 126/74 mmHg (10/12 0300) SpO2:  [91 %-100 %] 100 % (10/12 0300) Weight:  [121 lb 11.2 oz (55.203 kg)] 121 lb 11.2 oz (55.203 kg) (10/11 0820)  General: alert and no distress Lungs: CTAB Heart: RRR Lochia: appropriate Uterine Fundus: firm DVT Evaluation: No evidence of DVT seen on physical exam. Negative Homan's sign.  Discharge Diagnoses: Term Pregnancy-delivered and Preelampsia  Discharge Information: Date: 03/10/2013 Activity: pelvic rest and as directed Diet: routine Medications: PNV, Ibuprofen, Colace and Percocet Condition: stable Instructions: refer to discharge instructions Discharge to: home Follow-up Information   Follow up with [facility] In 1 week. (BP check)    Contact information:   [address] [place] [address] [phone]      Newborn Data: Live born female  Birth Weight: 6 lb 8.9 oz (2975 g) APGAR: 9, 9  Home with mother.  Tereso Newcomer, MD 03/10/2013 7:02 AM

## 2013-03-10 NOTE — Progress Notes (Signed)
Discharge instructions reviewed with patient and significant other.  Both state understanding of home care, activity, medications, signs/symptoms to report to MD and return MD office visit in 1 week for blood pressure check.  No home equipment needed.  Patient discharged with baby in stable condition with staff without incident.

## 2013-03-12 ENCOUNTER — Encounter: Payer: Medicaid Other | Admitting: Women's Health

## 2013-03-15 ENCOUNTER — Ambulatory Visit: Payer: Medicaid Other | Admitting: Obstetrics & Gynecology

## 2013-03-15 ENCOUNTER — Encounter: Payer: Medicaid Other | Admitting: Obstetrics & Gynecology

## 2013-03-15 ENCOUNTER — Encounter: Payer: Self-pay | Admitting: Obstetrics & Gynecology

## 2013-04-19 ENCOUNTER — Ambulatory Visit: Payer: Medicaid Other | Admitting: Obstetrics & Gynecology

## 2013-04-24 ENCOUNTER — Encounter: Payer: Self-pay | Admitting: *Deleted

## 2013-04-24 NOTE — Progress Notes (Unsigned)
Patient ID: Melanie Gonzales, female   DOB: 1991-04-08, 22 y.o.   MRN: [id number]   Called pt again to rs pp appt but pt did not answer phone.  Lmom for pt to call us back.  04/24/13  AS

## 2013-05-14 ENCOUNTER — Telehealth: Payer: Self-pay

## 2013-05-14 NOTE — Telephone Encounter (Signed)
Pt states has an appt tomorrow with Cathie Beams, CNM to discuss depo provera, can she get depo injection with appt. Informed pt that provider can give her a RX tomorrow for depo and have pt come back for the depo injection. Pt verbalized understanding.

## 2013-05-15 ENCOUNTER — Encounter: Payer: Self-pay | Admitting: Advanced Practice Midwife

## 2013-05-15 ENCOUNTER — Ambulatory Visit (INDEPENDENT_AMBULATORY_CARE_PROVIDER_SITE_OTHER): Payer: Medicaid Other | Admitting: Advanced Practice Midwife

## 2013-05-15 ENCOUNTER — Ambulatory Visit (INDEPENDENT_AMBULATORY_CARE_PROVIDER_SITE_OTHER): Payer: Medicaid Other | Admitting: Obstetrics & Gynecology

## 2013-05-15 ENCOUNTER — Encounter: Payer: Medicaid Other | Admitting: Advanced Practice Midwife

## 2013-05-15 VITALS — BP 110/70 | Ht 65.0 in | Wt 117.0 lb

## 2013-05-15 DIAGNOSIS — Z3049 Encounter for surveillance of other contraceptives: Secondary | ICD-10-CM

## 2013-05-15 DIAGNOSIS — Z309 Encounter for contraceptive management, unspecified: Secondary | ICD-10-CM

## 2013-05-15 DIAGNOSIS — Z3202 Encounter for pregnancy test, result negative: Secondary | ICD-10-CM

## 2013-05-15 LAB — POCT URINE PREGNANCY: Preg Test, Ur: NEGATIVE

## 2013-05-15 MED ORDER — MEDROXYPROGESTERONE ACETATE 150 MG/ML IM SUSP: 150.0000 mg | INTRAMUSCULAR | Status: DC

## 2013-05-15 MED ORDER — MEDROXYPROGESTERONE ACETATE 150 MG/ML IM SUSP
150.0000 mg | Freq: Once | INTRAMUSCULAR | Status: AC
  Administered 2013-05-15: 150 mg via INTRAMUSCULAR

## 2013-05-15 NOTE — Addendum Note (Signed)
Addended by: Colen Darling on: 05/15/2013 12:38 PM   Modules accepted: Orders

## 2013-05-15 NOTE — Progress Notes (Signed)
  Melanie Gonzales is a 22 y.o. who presents for a postpartum visit. She is 9 weeks postpartum following a spontaneous vaginal delivery. I have fully reviewed the prenatal and intrapartum course. The delivery was at 39.1 gestational weeks.  Anesthesia: epidural. Postpartum course has been uneventful. Baby's course has been complicated by Group B strep menengitis at 3 weeks old.   She was in the hospital for 6 weeks (coded, on a vent for 2 weeks). Has feeding problems and a G tube.  Goes to PT at [facility]. no. Bleeding: no bleeding. Bowel function is normal. Bladder function is normal. Patient is sexually active. Contraception method is condoms. Postpartum depression screening: negative. She has a lot of family support and help in caring for her baby.    Review of Systems   Constitutional: Negative for fever and chills Eyes: Negative for visual disturbances Respiratory: Negative for shortness of breath, dyspnea Cardiovascular: Negative for chest pain or palpitations  Gastrointestinal: Negative for vomiting, diarrhea and constipation Genitourinary: Negative for dysuria and urgency Musculoskeletal: Negative for back pain, joint pain, myalgias  Neurological: Negative for dizziness and headaches   Objective:     Filed Vitals:   05/15/13 1127  BP: 110/70   General:  alert, cooperative and no distress   Breasts:  negative  Lungs: clear to auscultation bilaterally  Heart:  regular rate and rhythm  Abdomen: Soft, nontender   Vulva:  normal  Vagina: normal vagina  Cervix:  closed  Corpus: Well involuted     Rectal Exam: no hemorrhoids        Assessment:    normal postpartum exam.  Plan:    1. Contraception: Depo-Provera injections 2. Follow up in:  after you pick up your rx for depo or as needed.

## 2013-05-15 NOTE — Progress Notes (Signed)
Pt here for Depo. Had postpartum visit earlier today so had no concerns at time of injection. JSY

## 2013-06-25 ENCOUNTER — Encounter: Payer: Self-pay | Admitting: Family Medicine

## 2013-06-25 ENCOUNTER — Ambulatory Visit (INDEPENDENT_AMBULATORY_CARE_PROVIDER_SITE_OTHER): Payer: Medicaid Other | Admitting: Family Medicine

## 2013-06-25 VITALS — BP 118/70 | Temp 98.6°F | Ht 65.0 in | Wt 115.6 lb

## 2013-06-25 DIAGNOSIS — Z8669 Personal history of other diseases of the nervous system and sense organs: Secondary | ICD-10-CM

## 2013-06-25 MED ORDER — SUMATRIPTAN SUCCINATE 50 MG PO TABS: 50.0000 mg | ORAL_TABLET | ORAL | Status: DC | PRN

## 2013-06-25 MED ORDER — TOPIRAMATE 25 MG PO TABS: ORAL_TABLET | ORAL | Status: DC

## 2013-06-25 MED ORDER — ONDANSETRON 4 MG PO TBDP: 4.0000 mg | ORAL_TABLET | Freq: Four times a day (QID) | ORAL | Status: DC | PRN

## 2013-06-25 NOTE — Progress Notes (Signed)
   Subjective:    Patient ID: Melanie Gonzales, female    DOB: 1990-07-12, 23 y.o.   MRN: [id number]  Migraine  This is a recurrent (Had in the past as a child) problem. The problem occurs intermittently. The pain is located in the right unilateral region. The pain radiates to the face, left neck and right neck. The pain quality is similar to prior headaches. Associated symptoms include abdominal pain, facial sweating, a fever, nausea, neck pain, photophobia and tinnitus. The symptoms are aggravated by bright light. She has tried oral narcotics for the symptoms. The treatment provided no relief.   Pos hx of migr headaches. Got them frequently and at one point was on propranolol  Usually throbing, inilateral, some photophobia phonophobia  Pain severe   Having threm faily  Takes ibu, doesn't help  Takes percocet prn,  Very nauseated, feels bad,  Had to take higher dose  occas ha in the past   Review of Systems  Constitutional: Positive for fever.  HENT: Positive for tinnitus.   Eyes: Positive for photophobia.  Gastrointestinal: Positive for nausea and abdominal pain.  Musculoskeletal: Positive for neck pain.       Objective:   Physical Exam  Alert no apparent distress. Neuro exam intact. Lungs clear. Heart regular in rhythm. HEENT normal.      Assessment & Plan:  Impression migraine headaches. Discussed at length. Worse recently. Definite increased stress with daughter with serious health issues. With frequency and severity need both primary and secondary prevention. Discussed with patient plan Imitrex when necessary for migraines. Zofran when necessary for nausea. Initiate Topamax rationale discussed. Recheck in 2 months. WSL

## 2013-07-04 NOTE — Progress Notes (Signed)
This encounter was created in error - please disregard.

## 2013-07-09 ENCOUNTER — Telehealth: Payer: Self-pay | Admitting: Obstetrics and Gynecology

## 2013-07-09 NOTE — Telephone Encounter (Signed)
Pt states got first depo injection this past December now having light spotting off and on. Informed pt not abnormal to have irregular bleeding when starting new birth control could take up to 3-6 mos to regulate. Pt verbalized understanding.

## 2013-08-07 ENCOUNTER — Ambulatory Visit: Payer: Medicaid Other

## 2013-08-08 ENCOUNTER — Encounter: Payer: Self-pay | Admitting: Adult Health

## 2013-08-08 ENCOUNTER — Ambulatory Visit: Payer: Medicaid Other

## 2013-08-08 ENCOUNTER — Ambulatory Visit (INDEPENDENT_AMBULATORY_CARE_PROVIDER_SITE_OTHER): Payer: Medicaid Other | Admitting: Adult Health

## 2013-08-08 VITALS — BP 102/70 | Ht 65.0 in | Wt 116.0 lb

## 2013-08-08 DIAGNOSIS — Z3202 Encounter for pregnancy test, result negative: Secondary | ICD-10-CM

## 2013-08-08 DIAGNOSIS — Z3200 Encounter for pregnancy test, result unknown: Secondary | ICD-10-CM

## 2013-08-08 DIAGNOSIS — Z3049 Encounter for surveillance of other contraceptives: Secondary | ICD-10-CM

## 2013-08-08 DIAGNOSIS — Z309 Encounter for contraceptive management, unspecified: Secondary | ICD-10-CM

## 2013-08-08 LAB — POCT URINE PREGNANCY: Preg Test, Ur: NEGATIVE

## 2013-08-08 MED ORDER — MEDROXYPROGESTERONE ACETATE 150 MG/ML IM SUSP
150.0000 mg | Freq: Once | INTRAMUSCULAR | Status: AC
  Administered 2013-08-08: 150 mg via INTRAMUSCULAR

## 2013-08-23 ENCOUNTER — Ambulatory Visit (INDEPENDENT_AMBULATORY_CARE_PROVIDER_SITE_OTHER): Payer: Medicaid Other | Admitting: Family Medicine

## 2013-08-23 ENCOUNTER — Encounter: Payer: Self-pay | Admitting: Family Medicine

## 2013-08-23 VITALS — BP 108/70 | Ht 65.0 in | Wt 116.2 lb

## 2013-08-23 DIAGNOSIS — Z8669 Personal history of other diseases of the nervous system and sense organs: Secondary | ICD-10-CM

## 2013-08-23 NOTE — Progress Notes (Signed)
   Subjective:    Patient ID: Melanie Gonzales, female    DOB: 1990-11-28, 23 y.o.   MRN: [id number]  Migraine  This is a chronic problem. The current episode started more than 1 year ago. The problem occurs intermittently. The problem has been unchanged. Nothing aggravates the symptoms. She has tried triptans for the symptoms. The treatment provided mild relief.  This is a follow up visit. Patient states that she gets very hot and she will experience excessive sweating also.   Tried the topamax, took for about a week and then stopped  Will suddenly break into cold seat and feels hot  Patient experiencing a tremendous amount of stress. Migraines still occurring once or twice per week. Pretty tough. Try the Topamax thinking off. Not really sure why.  Claims no overt depression. Dylan down and stress are. Would prefer not using medicine for depression specifically. Going on two months feels [phone]  On dep since dec had two injec, has felt more hot flashes since then.   Review of Systems No headache no chest pain no back pain no abdominal pain no change about habits no blood in stool ROS otherwise negative    Objective:   Physical Exam  Alert hydration good. HEENT normal. Neuro intact. Lungs clear. Heart regular in rhythm. Thyroid nonpalpable      Assessment & Plan:  Impression 1 migraine headaches discussed. #2 considerable stress with element of depression anxiety. Daughter very sick on and very stressful for patient. #3 hot flashes potentially related to Depakote Provera discussed plan encouraged to resume Topamax. Diet exercise discussed encourage. Or off on antidepressants. Check every 6 months. WSL

## 2013-08-28 ENCOUNTER — Encounter: Payer: Self-pay | Admitting: *Deleted

## 2013-10-09 ENCOUNTER — Encounter: Payer: Self-pay | Admitting: Nurse Practitioner

## 2013-10-09 ENCOUNTER — Ambulatory Visit (INDEPENDENT_AMBULATORY_CARE_PROVIDER_SITE_OTHER): Payer: Medicaid Other | Admitting: Nurse Practitioner

## 2013-10-09 VITALS — BP 122/70 | Temp 98.9°F | Ht 66.0 in | Wt 118.0 lb

## 2013-10-09 DIAGNOSIS — K589 Irritable bowel syndrome without diarrhea: Secondary | ICD-10-CM

## 2013-10-09 DIAGNOSIS — N938 Other specified abnormal uterine and vaginal bleeding: Secondary | ICD-10-CM

## 2013-10-09 DIAGNOSIS — R102 Pelvic and perineal pain unspecified side: Secondary | ICD-10-CM

## 2013-10-09 DIAGNOSIS — R109 Unspecified abdominal pain: Secondary | ICD-10-CM

## 2013-10-09 DIAGNOSIS — K219 Gastro-esophageal reflux disease without esophagitis: Secondary | ICD-10-CM

## 2013-10-09 DIAGNOSIS — N949 Unspecified condition associated with female genital organs and menstrual cycle: Secondary | ICD-10-CM

## 2013-10-09 LAB — POCT URINALYSIS DIPSTICK
PH UA: 6
Spec Grav, UA: 1.02

## 2013-10-09 LAB — POCT URINE PREGNANCY: PREG TEST UR: NEGATIVE

## 2013-10-09 MED ORDER — OMEPRAZOLE 20 MG PO CPDR: 20.0000 mg | DELAYED_RELEASE_CAPSULE | Freq: Every day | ORAL | Status: DC

## 2013-10-09 MED ORDER — HYOSCYAMINE SULFATE 0.125 MG SL SUBL: 0.1250 mg | SUBLINGUAL_TABLET | SUBLINGUAL | Status: DC | PRN

## 2013-10-09 NOTE — Patient Instructions (Signed)
Bowel probiotic (Align)  Gastroesophageal Reflux Disease, Adult Gastroesophageal reflux disease (GERD) happens when acid from your stomach flows up into the esophagus. When acid comes in contact with the esophagus, the acid causes soreness (inflammation) in the esophagus. Over time, GERD may create small holes (ulcers) in the lining of the esophagus. CAUSES   Increased body weight. This puts pressure on the stomach, making acid rise from the stomach into the esophagus.  Smoking. This increases acid production in the stomach.  Drinking alcohol. This causes decreased pressure in the lower esophageal sphincter (valve or ring of muscle between the esophagus and stomach), allowing acid from the stomach into the esophagus.  Late evening meals and a full stomach. This increases pressure and acid production in the stomach.  A malformed lower esophageal sphincter. Sometimes, no cause is found. SYMPTOMS   Burning pain in the lower part of the mid-chest behind the breastbone and in the mid-stomach area. This may occur twice a week or more often.  Trouble swallowing.  Sore throat.  Dry cough.  Asthma-like symptoms including chest tightness, shortness of breath, or wheezing. DIAGNOSIS  Your caregiver may be able to diagnose GERD based on your symptoms. In some cases, X-rays and other tests may be done to check for complications or to check the condition of your stomach and esophagus. TREATMENT  Your caregiver may recommend over-the-counter or prescription medicines to help decrease acid production. Ask your caregiver before starting or adding any new medicines.  HOME CARE INSTRUCTIONS   Change the factors that you can control. Ask your caregiver for guidance concerning weight loss, quitting smoking, and alcohol consumption.  Avoid foods and drinks that make your symptoms worse, such as:  Caffeine or alcoholic drinks.  Chocolate.  Peppermint or mint flavorings.  Garlic and  onions.  Spicy foods.  Citrus fruits, such as oranges, lemons, or limes.  Tomato-based foods such as sauce, chili, salsa, and pizza.  Fried and fatty foods.  Avoid lying down for the 3 hours prior to your bedtime or prior to taking a nap.  Eat small, frequent meals instead of large meals.  Wear loose-fitting clothing. Do not wear anything tight around your waist that causes pressure on your stomach.  Raise the head of your bed 6 to 8 inches with wood blocks to help you sleep. Extra pillows will not help.  Only take over-the-counter or prescription medicines for pain, discomfort, or fever as directed by your caregiver.  Do not take aspirin, ibuprofen, or other nonsteroidal anti-inflammatory drugs (NSAIDs). SEEK IMMEDIATE MEDICAL CARE IF:   You have pain in your arms, neck, jaw, teeth, or back.  Your pain increases or changes in intensity or duration.  You develop nausea, vomiting, or sweating (diaphoresis).  You develop shortness of breath, or you faint.  Your vomit is green, yellow, black, or looks like coffee grounds or blood.  Your stool is red, bloody, or black. These symptoms could be signs of other problems, such as heart disease, gastric bleeding, or esophageal bleeding. MAKE SURE YOU:   Understand these instructions.  Will watch your condition.  Will get help right away if you are not doing well or get worse. Document Released: 02/23/2005 Document Revised: 08/08/2011 Document Reviewed: 12/03/2010 [facility] Patient Information 2014 [place], [place]. Irritable Bowel Syndrome Irritable Bowel Syndrome (IBS) is caused by a disturbance of normal bowel function. Other terms used are spastic colon, mucous colitis, and irritable colon. It does not require surgery, nor does it lead to cancer. There is no  cure for IBS. But with proper diet, stress reduction, and medication, you will find that your problems (symptoms) will gradually disappear or improve. IBS is a common  digestive disorder. It usually appears in late adolescence or early adulthood. Women develop it twice as often as men. CAUSES  After food has been digested and absorbed in the small intestine, waste material is moved into the colon (large intestine). In the colon, water and salts are absorbed from the undigested products coming from the small intestine. The remaining residue, or fecal material, is held for elimination. Under normal circumstances, gentle, rhythmic contractions on the bowel walls push the fecal material along the colon towards the rectum. In IBS, however, these contractions are irregular and poorly coordinated. The fecal material is either retained too long, resulting in constipation, or expelled too soon, producing diarrhea. SYMPTOMS  The most common symptom of IBS is pain. It is typically in the lower left side of the belly (abdomen). But it may occur anywhere in the abdomen. It can be felt as heartburn, backache, or even as a dull pain in the arms or shoulders. The pain comes from excessive bowel-muscle spasms and from the buildup of gas and fecal material in the colon. This pain:  Can range from sharp belly (abdominal) cramps to a dull, continuous ache.  Usually worsens soon after eating.  Is typically relieved by having a bowel movement or passing gas. Abdominal pain is usually accompanied by constipation. But it may also produce diarrhea. The diarrhea typically occurs right after a meal or upon arising in the morning. The stools are typically soft and watery. They are often flecked with secretions (mucus). Other symptoms of IBS include:  Bloating.  Loss of appetite.  Heartburn.  Feeling sick to your stomach (nausea).  Belching  Vomiting  Gas. IBS may also cause a number of symptoms that are unrelated to the digestive system:  Fatigue.  Headaches.  Anxiety  Shortness of breath  Difficulty in concentrating.  Dizziness. These symptoms tend to come and  go. DIAGNOSIS  The symptoms of IBS closely mimic the symptoms of other, more serious digestive disorders. So your caregiver may wish to perform a variety of additional tests to exclude these disorders. He/she wants to be certain of learning what is wrong (diagnosis). The nature and purpose of each test will be explained to you. TREATMENT A number of medications are available to help correct bowel function and/or relieve bowel spasms and abdominal pain. Among the drugs available are:  Mild, non-irritating laxatives for severe constipation and to help restore normal bowel habits.  Specific anti-diarrheal medications to treat severe or prolonged diarrhea.  Anti-spasmodic agents to relieve intestinal cramps.  Your caregiver may also decide to treat you with a mild tranquilizer or sedative during unusually stressful periods in your life. The important thing to remember is that if any drug is prescribed for you, make sure that you take it exactly as directed. Make sure that your caregiver knows how well it worked for you. HOME CARE INSTRUCTIONS   Avoid foods that are high in fat or oils. Some examples ZOX:WRUEA cream, butter, frankfurters, sausage, and other fatty meats.  Avoid foods that have a laxative effect, such as fruit, fruit juice, and dairy products.  Cut out carbonated drinks, chewing gum, and "gassy" foods, such as beans and cabbage. This may help relieve bloating and belching.  Bran taken with plenty of liquids may help relieve constipation.  Keep track of what foods seem to trigger your  symptoms.  Avoid emotionally charged situations or circumstances that produce anxiety.  Start or continue exercising.  Get plenty of rest and sleep. MAKE SURE YOU:   Understand these instructions.  Will watch your condition.  Will get help right away if you are not doing well or get worse. Document Released: 05/16/2005 Document Revised: 08/08/2011 Document Reviewed: 01/04/2008 [facility]ExitCare  Patient Information 2014 [place]ExitCare, MarylandLLC.

## 2013-10-10 LAB — GC/CHLAMYDIA PROBE AMP, URINE
CHLAMYDIA, SWAB/URINE, PCR: NEGATIVE
GC Probe Amp, Urine: NEGATIVE

## 2013-10-11 ENCOUNTER — Encounter: Payer: Self-pay | Admitting: Nurse Practitioner

## 2013-10-11 DIAGNOSIS — K589 Irritable bowel syndrome without diarrhea: Secondary | ICD-10-CM | POA: Insufficient documentation

## 2013-10-11 DIAGNOSIS — K219 Gastro-esophageal reflux disease without esophagitis: Secondary | ICD-10-CM | POA: Insufficient documentation

## 2013-10-11 NOTE — Progress Notes (Signed)
Subjective:  Presents complaints of left lower abdominal pain that began 3-4 weeks ago. Describes as a deep dull pain worse after eating, unassociated with any particular foods. Occasional dull cramping. No fever. No constipation or diarrhea. No blood in her stool. Stools normal color. For the past 3-4 days has had generalized lower abdominal pain/pelvic pain mainly between 12 midnight into the next morning occurring almost every day. Gradually improves during the day. Same sexual partner. Had her last Depo-Provera injection on 3/12, has had off-and-on bleeding. No vaginal discharge. Occasional dyspareunia. Some reflux symptoms. Drinks a lot of caffeine. Nonsmoker. Occasional social alcohol use. Takes anti-inflammatories about every day. Has tried this for her pain with no relief. No nausea vomiting. No urinary symptoms.  Objective:   BP 122/70  Temp(Src) 98.9 F (37.2 C) (Oral)  Ht 5\' 6"  (1.676 m)  Wt 118 lb (53.524 kg)  BMI 19.05 kg/m2 NAD. Alert, oriented. Lungs clear. No CVA or flank tenderness. Heart regular rate rhythm. Abdomen soft nondistended with mild generalized lower abdominal tenderness. No rebound or guarding. No obvious masses.  Results for orders placed in visit on 10/09/13  GC/CHLAMYDIA PROBE AMP, URINE      Result Value Ref Range   Chlamydia, Swab/Urine, PCR NEGATIVE  NEGATIVE   GC Probe Amp, Urine NEGATIVE  NEGATIVE  POCT URINALYSIS DIPSTICK      Result Value Ref Range   Color, UA       Clarity, UA       Glucose, UA       Bilirubin, UA       Ketones, UA       Spec Grav, UA 1.020     Blood, UA       pH, UA 6.0     Protein, UA       Urobilinogen, UA       Nitrite, UA       Leukocytes, UA      POCT URINE PREGNANCY      Result Value Ref Range   Preg Test, Ur Negative       Assessment:Abdominal pain - Plan: POCT urinalysis dipstick, GC/chlamydia probe amp, urine, POCT urine pregnancy  Pelvic pain - Plan: GC/chlamydia probe amp, urine, POCT urine pregnancy  DUB  (dysfunctional uterine bleeding) secondary to Depo-Provera use  GERD (gastroesophageal reflux disease)  Irritable bowel syndrome  Plan: Meds ordered this encounter  Medications  . omeprazole (PRILOSEC) 20 MG capsule    Sig: Take 1 capsule (20 mg total) by mouth daily.    Dispense:  30 capsule    Refill:  2    Order Specific Question:  Supervising Provider    Answer:  Merlyn AlbertLUKING, WILLIAM S [[id number]]  . hyoscyamine (LEVSIN/SL) 0.125 MG SL tablet    Sig: Place 1 tablet (0.125 mg total) under the tongue every 4 (four) hours as needed.    Dispense:  30 tablet    Refill:  0    Order Specific Question:  Supervising Provider    Answer:  Riccardo DubinLUKING, WILLIAM S [[id number]]   Given written and verbal information on IBS and GERD. Discussed dietary measures and the importance of stress reduction. Warning signs reviewed including fever worsening abdominal pain vomiting or darkened stools. Followup with gynecology if breakthrough bleeding continues. Recheck in 2 weeks, call back sooner if worse.

## 2013-10-31 ENCOUNTER — Encounter: Payer: Self-pay | Admitting: Adult Health

## 2013-10-31 ENCOUNTER — Ambulatory Visit (INDEPENDENT_AMBULATORY_CARE_PROVIDER_SITE_OTHER): Payer: Medicaid Other | Admitting: Adult Health

## 2013-10-31 VITALS — BP 112/60 | Ht 66.0 in | Wt 119.0 lb

## 2013-10-31 DIAGNOSIS — Z3200 Encounter for pregnancy test, result unknown: Secondary | ICD-10-CM

## 2013-10-31 DIAGNOSIS — Z3202 Encounter for pregnancy test, result negative: Secondary | ICD-10-CM

## 2013-10-31 DIAGNOSIS — Z3049 Encounter for surveillance of other contraceptives: Secondary | ICD-10-CM

## 2013-10-31 DIAGNOSIS — Z309 Encounter for contraceptive management, unspecified: Secondary | ICD-10-CM

## 2013-10-31 LAB — POCT URINE PREGNANCY: PREG TEST UR: NEGATIVE

## 2013-10-31 MED ORDER — MEDROXYPROGESTERONE ACETATE 150 MG/ML IM SUSP
150.0000 mg | Freq: Once | INTRAMUSCULAR | Status: AC
  Administered 2013-10-31: 150 mg via INTRAMUSCULAR

## 2013-11-18 ENCOUNTER — Ambulatory Visit (INDEPENDENT_AMBULATORY_CARE_PROVIDER_SITE_OTHER): Payer: Medicaid Other | Admitting: Nurse Practitioner

## 2013-11-18 ENCOUNTER — Encounter: Payer: Self-pay | Admitting: Nurse Practitioner

## 2013-11-18 VITALS — BP 120/82 | Ht 66.0 in | Wt 117.0 lb

## 2013-11-18 DIAGNOSIS — K589 Irritable bowel syndrome without diarrhea: Secondary | ICD-10-CM

## 2013-11-18 DIAGNOSIS — K2970 Gastritis, unspecified, without bleeding: Secondary | ICD-10-CM

## 2013-11-18 DIAGNOSIS — K2990 Gastroduodenitis, unspecified, without bleeding: Secondary | ICD-10-CM

## 2013-11-18 DIAGNOSIS — R109 Unspecified abdominal pain: Secondary | ICD-10-CM

## 2013-11-18 MED ORDER — DICYCLOMINE HCL 10 MG PO CAPS: 10.0000 mg | ORAL_CAPSULE | Freq: Three times a day (TID) | ORAL | Status: DC

## 2013-11-20 ENCOUNTER — Encounter: Payer: Self-pay | Admitting: Nurse Practitioner

## 2013-11-20 LAB — BASIC METABOLIC PANEL
BUN: 15 mg/dL (ref 6–23)
CO2: 26 mEq/L (ref 19–32)
Calcium: 9.8 mg/dL (ref 8.4–10.5)
Chloride: 102 mEq/L (ref 96–112)
Creat: 0.66 mg/dL (ref 0.50–1.10)
Glucose, Bld: 87 mg/dL (ref 70–99)
Potassium: 3.9 mEq/L (ref 3.5–5.3)
SODIUM: 136 meq/L (ref 135–145)

## 2013-11-20 LAB — CBC
HEMATOCRIT: 39.4 % (ref 36.0–46.0)
Hemoglobin: 13.3 g/dL (ref 12.0–15.0)
MCH: 30.9 pg (ref 26.0–34.0)
MCHC: 33.8 g/dL (ref 30.0–36.0)
MCV: 91.6 fL (ref 78.0–100.0)
Platelets: 351 10*3/uL (ref 150–400)
RBC: 4.3 MIL/uL (ref 3.87–5.11)
RDW: 12.8 % (ref 11.5–15.5)
WBC: 3.7 10*3/uL — AB (ref 4.0–10.5)

## 2013-11-20 LAB — HEPATIC FUNCTION PANEL
ALT: 18 U/L (ref 0–35)
AST: 15 U/L (ref 0–37)
Albumin: 4.3 g/dL (ref 3.5–5.2)
Alkaline Phosphatase: 61 U/L (ref 39–117)
BILIRUBIN DIRECT: 0.1 mg/dL (ref 0.0–0.3)
BILIRUBIN INDIRECT: 0.5 mg/dL (ref 0.2–1.2)
BILIRUBIN TOTAL: 0.6 mg/dL (ref 0.2–1.2)
Total Protein: 7.5 g/dL (ref 6.0–8.3)

## 2013-11-20 LAB — LIPASE: LIPASE: 39 U/L (ref 0–75)

## 2013-11-20 NOTE — Progress Notes (Signed)
Subjective:  Presents for recheck on her abdominal pain. Discomfort has gone from every day to 4/7 days at most. Occurs mostly at nighttime. Describes as a dull pain mainly in the upper mid abdominal area with generalized bloating and gas in the lower abdomen. No relief with Levsin. No constipation, diarrhea or blood in her stools. Stools normal color. Symptoms are worse after a full meal especially at night. Also slight stomach upset with spicy foods. Denies any overt heartburn or reflux symptoms. Has been out of her omeprazole for the past 5 days. See previous note 10/09/13. Has cut back some on her caffeine use. Also has stopped all anti-inflammatories.  Objective:   BP 120/82  Ht 5\' 6"  (1.676 m)  Wt 117 lb (53.071 kg)  BMI 18.89 kg/m2  LMP 10/17/2013 NAD. Alert, oriented. Lungs clear. Heart regular rate rhythm. Abdomen soft nondistended with hyperactive bowel sounds x4. Moderate localized tenderness near the epigastric area, minimal lower abdominal tenderness, no rebound or guarding. No obvious masses.  Assessment: Problem List Items Addressed This Visit     Digestive   Irritable bowel syndrome   Relevant Medications      dicyclomine (BENTYL) 10 MG capsule    Other Visit Diagnoses   Gastritis    -  Primary    Relevant Orders       CBC       Hepatic function panel       Basic metabolic panel       Lipase    Abdominal cramping        Relevant Orders       CBC       Hepatic function panel       Basic metabolic panel       Lipase      Plan: Meds ordered this encounter  Medications  . dicyclomine (BENTYL) 10 MG capsule    Sig: Take 1 capsule (10 mg total) by mouth 4 (four) times daily -  before meals and at bedtime. Prn abd cramps    Dispense:  40 capsule    Refill:  0    Order Specific Question:  Supervising Provider    Answer:  Merlyn AlbertLUKING, WILLIAM S [[id number]]   Restart omeprazole daily. Continue to avoid caffeine and anti-inflammatories. Callback in 7-10 days if no improvement in  symptoms, sooner if worse. Warning signs were reviewed.

## 2013-11-21 NOTE — Progress Notes (Signed)
Patient notified and verbalized understanding. 

## 2013-12-16 ENCOUNTER — Telehealth: Payer: Self-pay | Admitting: Nurse Practitioner

## 2013-12-16 NOTE — Telephone Encounter (Signed)
Seen on 11/18/13

## 2013-12-16 NOTE — Telephone Encounter (Signed)
Patient says the medication that she is taking for her stomach is not having any results and she is tired of taking medicine. She wants to know if we can schedule some kind of test to get the ball rolling?

## 2013-12-17 NOTE — Telephone Encounter (Signed)
Transferred pt up front to schedule OV with Eber Jonesarolyn.

## 2013-12-17 NOTE — Telephone Encounter (Signed)
Please schedule office visit to reevaluate and discuss further tests or need for referral.

## 2013-12-30 ENCOUNTER — Ambulatory Visit: Payer: Medicaid Other | Admitting: Nurse Practitioner

## 2014-01-15 ENCOUNTER — Ambulatory Visit (INDEPENDENT_AMBULATORY_CARE_PROVIDER_SITE_OTHER): Payer: Medicaid Other | Admitting: Nurse Practitioner

## 2014-01-15 ENCOUNTER — Encounter: Payer: Self-pay | Admitting: Nurse Practitioner

## 2014-01-15 VITALS — BP 118/80 | Ht 66.0 in | Wt 122.0 lb

## 2014-01-15 DIAGNOSIS — K219 Gastro-esophageal reflux disease without esophagitis: Secondary | ICD-10-CM

## 2014-01-15 DIAGNOSIS — K589 Irritable bowel syndrome without diarrhea: Secondary | ICD-10-CM

## 2014-01-15 MED ORDER — PB-HYOSCY-ATROPINE-SCOPOLAMINE 16.2 MG PO TABS: 1.0000 | ORAL_TABLET | Freq: Three times a day (TID) | ORAL | Status: DC | PRN

## 2014-01-15 MED ORDER — PANTOPRAZOLE SODIUM 40 MG PO TBEC: 40.0000 mg | DELAYED_RELEASE_TABLET | Freq: Every day | ORAL | Status: DC

## 2014-01-17 ENCOUNTER — Encounter: Payer: Self-pay | Admitting: Nurse Practitioner

## 2014-01-17 NOTE — Progress Notes (Signed)
Subjective:  Presents for recheck of her GI symptoms. No improvement with Bentyl. Having abdominal pain bloating and gas after eating, unassociated with any particular foods. Stools normal color. No diarrhea or constipation. Some heartburn and reflux symptoms, mild upper abdominal pain at times, mostly generalized lower abdominal pain and cramping. No fever. No nausea vomiting. Note migraines have been stable. See previous notes.  Objective:   BP 118/80  Ht _0  (1.676 m)  Wt 122 lb (55.339 kg)  BMI 19.70 kg/m2 NAD. Alert, oriented. Lungs clear. Heart regular rate rhythm. Abdomen soft nondistended with very mild epigastric area tenderness on exam. Generalized lower abdominal tenderness, rebound guarding. No obvious masses.  Assessment: Problem List Items Addressed This Visit     Digestive   GERD (gastroesophageal reflux disease)   Relevant Medications      pantoprazole (PROTONIX) EC tablet      belladona alk-PHENObarbital (DONNATAL) 16.2 MG tablet   Irritable bowel syndrome - Primary   Relevant Medications      pantoprazole (PROTONIX) EC tablet      belladona alk-PHENObarbital (DONNATAL) 16.2 MG tablet      Plan: Meds ordered this encounter  Medications  . pantoprazole (PROTONIX) 40 MG tablet    Sig: Take 1 tablet (40 mg total) by mouth daily.    Dispense:  30 tablet    Refill:  2    Order Specific Question:  Supervising Provider    Answer:  Mikey Kirschner [[id number]]  . DISCONTD: belladona alk-PHENObarbital (DONNATAL) 16.2 MG tablet    Sig: Take 1 tablet by mouth every 8 (eight) hours as needed. For abd cramps    Dispense:  90 tablet    Refill:  0    Order Specific Question:  Supervising Provider    Answer:  Mikey Kirschner [[id number]]  . DISCONTD: belladona alk-PHENObarbital (DONNATAL) 16.2 MG tablet    Sig: Take 1 tablet by mouth every 8 (eight) hours as needed. For abd cramps    Dispense:  90 tablet    Refill:  0  . belladona alk-PHENObarbital (DONNATAL) 16.2 MG tablet   Sig: Take 1 tablet by mouth every 8 (eight) hours as needed. For abd cramps    Dispense:  90 tablet    Refill:  0   Stop omeprazole and Bentyl. Switch to Protonix and Donnatal. Again discussed importance of lifestyle measures affecting her symptoms. Set up GI referral. Warning signs reviewed. Call back sooner if symptoms worsen.

## 2014-01-23 ENCOUNTER — Encounter: Payer: Self-pay | Admitting: Adult Health

## 2014-01-23 ENCOUNTER — Ambulatory Visit (INDEPENDENT_AMBULATORY_CARE_PROVIDER_SITE_OTHER): Payer: Medicaid Other | Admitting: Adult Health

## 2014-01-23 DIAGNOSIS — Z3049 Encounter for surveillance of other contraceptives: Secondary | ICD-10-CM

## 2014-01-23 DIAGNOSIS — Z3042 Encounter for surveillance of injectable contraceptive: Secondary | ICD-10-CM

## 2014-01-23 DIAGNOSIS — Z3202 Encounter for pregnancy test, result negative: Secondary | ICD-10-CM

## 2014-01-23 LAB — POCT URINE PREGNANCY: Preg Test, Ur: NEGATIVE

## 2014-01-23 MED ORDER — MEDROXYPROGESTERONE ACETATE 150 MG/ML IM SUSP
150.0000 mg | Freq: Once | INTRAMUSCULAR | Status: AC
  Administered 2014-01-23: 150 mg via INTRAMUSCULAR

## 2014-01-28 ENCOUNTER — Encounter: Payer: Self-pay | Admitting: Gastroenterology

## 2014-02-10 ENCOUNTER — Encounter: Payer: Self-pay | Admitting: Gastroenterology

## 2014-03-03 ENCOUNTER — Ambulatory Visit: Payer: BC Managed Care – PPO | Admitting: Gastroenterology

## 2014-03-10 ENCOUNTER — Encounter: Payer: Self-pay | Admitting: Gastroenterology

## 2014-03-10 ENCOUNTER — Ambulatory Visit (INDEPENDENT_AMBULATORY_CARE_PROVIDER_SITE_OTHER): Payer: Medicaid Other | Admitting: Gastroenterology

## 2014-03-10 VITALS — BP 125/77 | HR 72 | Temp 97.4°F | Ht 66.0 in | Wt 124.4 lb

## 2014-03-10 DIAGNOSIS — R1013 Epigastric pain: Secondary | ICD-10-CM

## 2014-03-10 DIAGNOSIS — K589 Irritable bowel syndrome without diarrhea: Secondary | ICD-10-CM

## 2014-03-10 DIAGNOSIS — G8929 Other chronic pain: Secondary | ICD-10-CM | POA: Insufficient documentation

## 2014-03-10 MED ORDER — LINACLOTIDE 145 MCG PO CAPS: 145.0000 ug | ORAL_CAPSULE | Freq: Every day | ORAL | Status: DC

## 2014-03-10 NOTE — Progress Notes (Signed)
cc'ed to pcp °

## 2014-03-10 NOTE — Progress Notes (Signed)
Primary Care Physician:  Rubbie Battiest, MD  Primary Gastroenterologist:  Barney Drain, MD   Chief Complaint  Patient presents with  . Gastrophageal Reflux  . Irritable Bowel Syndrome    HPI:  Melanie Gonzales is a 23 y.o. female here at the request of Pearson Forster, NP for further evaluation of irritable bowel syndrome/GERD.  Patient reports abdominal issues since the springtime of this year. At baseline she has had intermittent abdominal discomfort for years. Symptoms have been progressive over the past 6 months. She is a stay-at-home mom. Her daughter was born one year ago, within 2 weeks of delivery she developed bacterial meningitis with a lengthy hospitalization. She requires multi-focus in home care but seems to be improving.  Patient reports lower abdominal pain described as sharp pains to double cramps. She often feels bloated. She has a bowel movement about twice per week, [place] stool 1. Never gets relief. Denies melena or rectal bleeding. Complains of postprandial epigastric pain associated with some nausea but no vomiting. Denies heartburn, dysphagia. Started on pantoprazole in August but really not sure it's helping. Previously failed Bentyl, Levsin. Currently on Donnatal without much relief.  Has some good days, about 2 days per week. Since her pregnancy she's had less frequent stools. Appetite okay. No unintentional weight loss. Abdominal pain related to meals but doesn't matter what she eats.  Previously failed stool softener and miralax (over a month).       Current Outpatient Prescriptions  Medication Sig Dispense Refill  . belladona alk-PHENObarbital (DONNATAL) 16.2 MG tablet Take 1 tablet by mouth every 8 (eight) hours as needed. For abd cramps  90 tablet  0  . medroxyPROGESTERone (DEPO-PROVERA) 150 MG/ML injection Inject 1 mL (150 mg total) into the muscle every 3 (three) months.  1 mL  3  . pantoprazole (PROTONIX) 40 MG tablet Take 1 tablet (40 mg total) by mouth  daily.  30 tablet  2   No current facility-administered medications for this visit.    Allergies as of 03/10/2014 - Review Complete 03/10/2014  Allergen Reaction Noted  . Hydrocodone Itching 08/13/2012  . Penicillins Hives 08/13/2012    Past Medical History  Diagnosis Date  . Hx of migraines 08/13/2012  . Migraines   . GERD (gastroesophageal reflux disease)     Past Surgical History  Procedure Laterality Date  . No past surgeries      Family History  Problem Relation Age of Onset  . Deep vein thrombosis Father   . Hypertension Paternal Grandmother   . Thyroid disease Paternal Grandmother     has had thyroid removed  . Ulcers Mother   . GER disease Mother   . Gallbladder disease Neg Hx   . Colon cancer Neg Hx   . Crohn's disease Neg Hx     History   Social History  . Marital Status: Single    Spouse Name: N/A    Number of Children: 1  . Years of Education: N/A   Occupational History  . 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 Smoking status: Former Smoker    Types: Cigars  . Smokeless tobacco: Never Used  . Alcohol Use: No  . Drug Use: No  . Sexual Activity: Yes    Birth Control/ Protection: Injection   Other Topics Concern  . Not on file   Social History Narrative  . No narrative on file      ROS:  General: Negative for anorexia, weight loss, fever, chills,  fatigue, weakness. Eyes: Negative for vision changes.  ENT: Negative for hoarseness, difficulty swallowing , nasal congestion. CV: Negative for chest pain, angina, palpitations, dyspnea on exertion, peripheral edema.  Respiratory: Negative for dyspnea at rest, dyspnea on exertion, cough, sputum, wheezing.  GI: See history of present illness. GU:  Negative for dysuria, hematuria, urinary incontinence, urinary frequency, nocturnal urination. Complains of urinary urgency with little warning.  MS: Negative for joint pain, low back pain.  Derm: Negative for rash or itching.  Neuro: Negative  for weakness, abnormal sensation, seizure, frequent headaches, memory loss, confusion.  Psych: Negative for anxiety, depression, suicidal ideation, hallucinations.  Endo: Negative for unusual weight change.  Heme: Negative for bruising or bleeding. Allergy: Negative for rash or hives.    Physical Examination:  BP 125/77  Pulse 72  Temp(Src) 97.4 F (36.3 C) (Oral)  Ht $R'5\' 6"'wd$  (1.676 m)  Wt 124 lb 6.4 oz (56.427 kg)  BMI 20.09 kg/m2   General: Well-nourished, well-developed in no acute distress.  Head: Normocephalic, atraumatic.   Eyes: Conjunctiva pink, no icterus. Mouth: Oropharyngeal mucosa moist and pink , no lesions erythema or exudate. Neck: Supple without thyromegaly, masses, or lymphadenopathy.  Lungs: Clear to auscultation bilaterally.  Heart: Regular rate and rhythm, no murmurs rubs or gallops.  Abdomen: Bowel sounds are normal, mild lower abdominal tenderness/epigastric tenderness , nondistended, no hepatosplenomegaly or masses, no abdominal bruits or    hernia , no rebound or guarding.   Rectal: Not performed Extremities: No lower extremity edema. No clubbing or deformities.  Neuro: Alert and oriented x 4 , grossly normal neurologically.  Skin: Warm and dry, no rash or jaundice.   Psych: Alert and cooperative, normal mood and affect.  Labs: Lab Results  Component Value Date   WBC 3.7* 11/18/2013   HGB 13.3 11/18/2013   HCT 39.4 11/18/2013   MCV 91.6 11/18/2013   PLT 351 11/18/2013   Lab Results  Component Value Date   ALT 18 11/18/2013   AST 15 11/18/2013   ALKPHOS 61 11/18/2013   BILITOT 0.6 11/18/2013   Lab Results  Component Value Date   CREATININE 0.66 11/18/2013   BUN 15 11/18/2013   NA 136 11/18/2013   K 3.9 11/18/2013   CL 102 11/18/2013   CO2 26 11/18/2013   Lab Results  Component Value Date   LIPASE 39 11/18/2013     Imaging Studies: No results found.

## 2014-03-10 NOTE — Assessment & Plan Note (Addendum)
23 year old lady with six-month history of progressive constipation, bloating, lower abdominal cramps with prior history of intermittent abdominal discomfort. Also complains of postprandial epigastric pain. Possibly related to constipation/IBS +/- dyspepsia or biliary component. Discussed with patient. Trial of Linzess 145mcg daily. If still symptomatic with correction of bowel function, then consider further work up. Consider abdominal u/s as next step. She will continue pantoprazole, stop belladonna.   Patient will call with progress report in two weeks and we will decide next step at that time.

## 2014-03-10 NOTE — Patient Instructions (Signed)
1. Stop Belladonna. 2. Start Linzess one daily before breakfast.  3. Call in 2 weeks and let me know how you are doing with the abdominal pain, bloating, constipation.

## 2014-03-31 ENCOUNTER — Encounter: Payer: Self-pay | Admitting: Gastroenterology

## 2014-04-16 ENCOUNTER — Other Ambulatory Visit: Payer: Self-pay | Admitting: Advanced Practice Midwife

## 2014-04-17 ENCOUNTER — Telehealth: Payer: Self-pay | Admitting: Advanced Practice Midwife

## 2014-04-17 ENCOUNTER — Ambulatory Visit: Payer: Medicaid Other

## 2014-04-17 ENCOUNTER — Ambulatory Visit (INDEPENDENT_AMBULATORY_CARE_PROVIDER_SITE_OTHER): Payer: Medicaid Other | Admitting: Adult Health

## 2014-04-17 ENCOUNTER — Encounter: Payer: Self-pay | Admitting: Adult Health

## 2014-04-17 DIAGNOSIS — Z3042 Encounter for surveillance of injectable contraceptive: Secondary | ICD-10-CM

## 2014-04-17 DIAGNOSIS — Z3202 Encounter for pregnancy test, result negative: Secondary | ICD-10-CM

## 2014-04-17 LAB — POCT URINE PREGNANCY: Preg Test, Ur: NEGATIVE

## 2014-04-17 MED ORDER — MEDROXYPROGESTERONE ACETATE 150 MG/ML IM SUSP
150.0000 mg | Freq: Once | INTRAMUSCULAR | Status: AC
  Administered 2014-04-17: 150 mg via INTRAMUSCULAR

## 2014-04-17 NOTE — Telephone Encounter (Signed)
Pt informed Depo provera e-scribed to pharmacy.

## 2014-05-02 NOTE — Progress Notes (Signed)
This encounter was created in error - please disregard.

## 2014-07-08 ENCOUNTER — Ambulatory Visit (INDEPENDENT_AMBULATORY_CARE_PROVIDER_SITE_OTHER): Payer: Medicaid Other | Admitting: Family Medicine

## 2014-07-08 ENCOUNTER — Encounter: Payer: Self-pay | Admitting: Family Medicine

## 2014-07-08 VITALS — BP 130/80 | Temp 98.0°F | Ht 66.0 in | Wt 126.2 lb

## 2014-07-08 DIAGNOSIS — M5432 Sciatica, left side: Secondary | ICD-10-CM

## 2014-07-08 MED ORDER — PREDNISONE 20 MG PO TABS: ORAL_TABLET | ORAL | Status: DC

## 2014-07-08 MED ORDER — HYDROCODONE-ACETAMINOPHEN 5-325 MG PO TABS: ORAL_TABLET | ORAL | Status: DC

## 2014-07-08 NOTE — Progress Notes (Signed)
   Subjective:    Patient ID: Melanie GosselinKrishawna Gonzales, female    DOB: [birth year], 23 y.o.   MRN: [id number]  HPI Patient is here today because she is having some cramping in her buttocks. She is also experiencing sharp pains in her lower back and down her left leg. This started 4 days ago. Patient states she has no other concerns at this time.   Patient had similar pain in left hip and leg during pregnancy. In fact led to a fall at one point.  No abdominal pain no fever no chills no rash Review of Systems See above. No knee pain no change in urinary habits    Objective:   Physical Exam  Alert significant discomfort vitals stable. Lungs clear heart rare rhythm left sciatic notch tenderness to deep palpation plus minus straight leg raise on left      Assessment & Plan:  Impression sciatica with secondary neuropathic features plan prednisone taper. Hydrocodone when necessary. Local measures discussed. WSL

## 2014-07-10 ENCOUNTER — Ambulatory Visit: Payer: Medicaid Other

## 2014-07-10 ENCOUNTER — Ambulatory Visit (INDEPENDENT_AMBULATORY_CARE_PROVIDER_SITE_OTHER): Payer: Medicaid Other | Admitting: *Deleted

## 2014-07-10 ENCOUNTER — Other Ambulatory Visit: Payer: Medicaid Other | Admitting: Adult Health

## 2014-07-10 ENCOUNTER — Encounter: Payer: Self-pay | Admitting: *Deleted

## 2014-07-10 DIAGNOSIS — Z3202 Encounter for pregnancy test, result negative: Secondary | ICD-10-CM

## 2014-07-10 DIAGNOSIS — Z3042 Encounter for surveillance of injectable contraceptive: Secondary | ICD-10-CM

## 2014-07-10 LAB — POCT URINE PREGNANCY: PREG TEST UR: NEGATIVE

## 2014-07-10 MED ORDER — MEDROXYPROGESTERONE ACETATE 150 MG/ML IM SUSP
150.0000 mg | Freq: Once | INTRAMUSCULAR | Status: AC
  Administered 2014-07-10: 150 mg via INTRAMUSCULAR

## 2014-07-10 NOTE — Progress Notes (Signed)
Pt here for Depo. Pt reports breast pain and breasts leaking. Pt hasn't breastfed in over a year. Advised would need to be seen. Pt to schedule an appt. Return in 12 weeks for next Depo. JSY

## 2014-07-15 ENCOUNTER — Encounter: Payer: Self-pay | Admitting: Adult Health

## 2014-07-15 ENCOUNTER — Ambulatory Visit (INDEPENDENT_AMBULATORY_CARE_PROVIDER_SITE_OTHER): Payer: Medicaid Other | Admitting: Adult Health

## 2014-07-15 ENCOUNTER — Ambulatory Visit: Payer: Medicaid Other | Admitting: Adult Health

## 2014-07-15 ENCOUNTER — Other Ambulatory Visit (HOSPITAL_COMMUNITY)
Admission: RE | Admit: 2014-07-15 | Discharge: 2014-07-15 | Disposition: A | Discharge status: Home / Self Care | Payer: Medicaid Other | Source: Ambulatory Visit | Attending: Adult Health | Admitting: Adult Health

## 2014-07-15 VITALS — BP 132/64 | HR 86 | Ht 64.5 in | Wt 128.0 lb

## 2014-07-15 DIAGNOSIS — Z3042 Encounter for surveillance of injectable contraceptive: Secondary | ICD-10-CM

## 2014-07-15 DIAGNOSIS — Z01411 Encounter for gynecological examination (general) (routine) with abnormal findings: Secondary | ICD-10-CM | POA: Insufficient documentation

## 2014-07-15 DIAGNOSIS — Z0000 Encounter for general adult medical examination without abnormal findings: Secondary | ICD-10-CM

## 2014-07-15 DIAGNOSIS — Z01419 Encounter for gynecological examination (general) (routine) without abnormal findings: Secondary | ICD-10-CM

## 2014-07-15 DIAGNOSIS — Z1151 Encounter for screening for human papillomavirus (HPV): Secondary | ICD-10-CM | POA: Diagnosis present

## 2014-07-15 DIAGNOSIS — Z113 Encounter for screening for infections with a predominantly sexual mode of transmission: Secondary | ICD-10-CM | POA: Insufficient documentation

## 2014-07-15 DIAGNOSIS — N6452 Nipple discharge: Secondary | ICD-10-CM | POA: Insufficient documentation

## 2014-07-15 HISTORY — DX: Nipple discharge: N64.52

## 2014-07-15 NOTE — Progress Notes (Signed)
Patient ID: Melanie GosselinKrishawna Gonzales, female   DOB: 1990-06-15, 24 y.o.   MRN: [id number] History of Present Illness: [name] is a 24 year old black female in for well woman gyn exam and first pap.She is complaining of nipple discharge like milk from both breasts and she last breast fed 04/2013.And they are tender at times   Current Medications, Allergies, Past Medical History, Past Surgical History, Family History and Social History were reviewed in [facility] electronic medical record.     Review of Systems: Patient denies any headaches, hearing loss, fatigue, blurred vision, shortness of breath, chest pain, abdominal pain, problems with  urination, or intercourse. No joint pain or mood swings.She is using depo and is happy with it.  +breast discharge, like milk. She has some constipation and [facility]   Physical Exam:BP 132/64 mmHg  Pulse 86  Ht 5' 4.5" (1.638 m)  Wt 128 lb (58.06 kg)  BMI 21.64 kg/m2  Breastfeeding? No General:  Well developed, well nourished, no acute distress Skin:  Warm and dry Neck:  Midline trachea, normal thyroid, good ROM, no lymphadenopathy Lungs; Clear to auscultation bilaterally Breast:  No dominant palpable mass,or retraction, has nipple discharge on right from 2 sites, white, milky, no nipple discharge on left today Cardiovascular: Regular rate and rhythm Abdomen:  Soft, non tender, no hepatosplenomegaly Pelvic:  External genitalia is normal in appearance, no lesions.  The vagina is normal in appearance, with good color, moisture and rugae. Urethra has no lesions or masses. The cervix is smooth and has cocks comb, pap with GC/CHL performed.  Uterus is felt to be normal size, shape, and contour.  No adnexal masses or tenderness noted.Bladder is non tender, no masses felt. Extremities/musculoskeletal:  No swelling or varicosities noted, no clubbing or cyanosis Psych:  No mood changes, alert and cooperative,seems happy   Impression: Well woman gyn  exam with pap Nipple discharge Contraceptive management     Plan: Check TSH and prolactin level in 1-2 days, when has not manipulated breasts, will talk when results back. Decrease caffeine Physical in 1 year Pap in 2 years Depo as scheduled, has refills

## 2014-07-15 NOTE — Patient Instructions (Addendum)
Decrease caffeine Physical in 1 year Pap in 2 years Get TSH and Prolactin level Depo as scheduled

## 2014-07-17 ENCOUNTER — Other Ambulatory Visit: Payer: Self-pay | Admitting: Adult Health

## 2014-07-17 LAB — CYTOLOGY - PAP

## 2014-07-18 LAB — PROLACTIN: Prolactin: 8.5 ng/mL

## 2014-07-18 LAB — TSH: TSH: 1 u[IU]/mL (ref 0.350–4.500)

## 2014-07-21 ENCOUNTER — Telehealth: Payer: Self-pay | Admitting: Adult Health

## 2014-07-21 NOTE — Telephone Encounter (Signed)
Pt aware labs normal  

## 2014-07-22 ENCOUNTER — Telehealth: Payer: Self-pay | Admitting: Adult Health

## 2014-07-22 NOTE — Telephone Encounter (Signed)
Pt aware of pap results.

## 2014-07-25 ENCOUNTER — Telehealth: Payer: Self-pay | Admitting: Family Medicine

## 2014-07-25 NOTE — Telephone Encounter (Signed)
[facility] (Told pt over VM of she has severe pain over the weekend, to go to ER)

## 2014-07-25 NOTE — Telephone Encounter (Signed)
Pt called to let the Dr. Ashley MarinerKnow that she finished her meds the 16th  Of this month and is starting to feel numbness in her left leg and  She is starting to get an aching pain in her left leg,lower back,and  Left side of rear end.

## 2014-07-28 NOTE — Telephone Encounter (Signed)
rec rep ov , may well need furthr testing

## 2014-07-28 NOTE — Telephone Encounter (Signed)
Transferred pt up front to schedule OV with Dr. Brett CanalesSteve for recheck.

## 2014-07-28 NOTE — Telephone Encounter (Signed)
Pt said she is still having some pain and numbness in her leg. She does not take much of the hydrocodone b/c it makes her drowsy, and she needs to take care of her daughter. Pt figured all of the pain would be gone after finishing the prednisone. She wants to know if she needs to be seen again or what do you suggest she does for the pain/numbness?

## 2014-08-01 ENCOUNTER — Ambulatory Visit (INDEPENDENT_AMBULATORY_CARE_PROVIDER_SITE_OTHER): Payer: Medicaid Other | Admitting: Family Medicine

## 2014-08-01 ENCOUNTER — Encounter: Payer: Self-pay | Admitting: Family Medicine

## 2014-08-01 VITALS — BP 110/68 | Temp 98.5°F | Ht 66.0 in | Wt 126.4 lb

## 2014-08-01 DIAGNOSIS — M5432 Sciatica, left side: Secondary | ICD-10-CM

## 2014-08-01 MED ORDER — ETODOLAC 400 MG PO TABS: 400.0000 mg | ORAL_TABLET | Freq: Two times a day (BID) | ORAL | Status: DC

## 2014-08-01 NOTE — Progress Notes (Signed)
   Subjective:    Patient ID: Melanie Gonzales, female    DOB: 30-Nov-1990, 24 y.o.   MRN: [id number]  HPI Patient is here today for a recheck of her leg pain/sciatica. Patient states that it has improved. She states that she still has some pain and numbness but it is not as bad as it was.  Patient states that she has no other concerns at this time.   Leg not as bad a s it was  Now still sig aching and irritating pain  Notes deep aching calf pain  Pain is worse int the left leg  No back surg in family, some fam hx of bk pain    Review of Systems No hip pain no abdominal pain no chest pain no low back pain ROS otherwise negative    Objective:   Physical Exam Alert no apparent distress talkative vital signs stable lungs clear heart regular in rhythm left leg slight positive straight leg raise definitely improved       Assessment & Plan:  Impression improving sciatica discussed at length plan hold off on MRI or other interventions now. Exercise discussed local measures discussed. Lodine when necessary for pain. 25 minutes spent most in discussion WSL

## 2014-08-08 ENCOUNTER — Other Ambulatory Visit: Payer: Self-pay | Admitting: *Deleted

## 2014-08-08 ENCOUNTER — Telehealth: Payer: Self-pay | Admitting: Family Medicine

## 2014-08-08 MED ORDER — NAPROXEN 500 MG PO TBEC: 500.0000 mg | DELAYED_RELEASE_TABLET | Freq: Two times a day (BID) | ORAL | Status: DC

## 2014-08-08 NOTE — Telephone Encounter (Signed)
Pt's ETODOLAC is Non-Preferred on the Medicaid PDL.  Please see copy of the preferred list in green folder, please advise.  Pt must fail 2 preferred medications before a Non-Preferred can be approved

## 2014-08-12 ENCOUNTER — Telehealth: Payer: Self-pay | Admitting: Gastroenterology

## 2014-08-12 NOTE — Telephone Encounter (Signed)
Patient called to say that the linzess worked and then stopped doing anything, she was supposed to call us after her last ov and let lsl know.    Please advise [phone]

## 2014-08-13 NOTE — Telephone Encounter (Signed)
Pt said she took the Linzess 145 mcg qd without food and it worked good til about 6 weeks ago. She said she is only having a BM about every 2-3 days and it is not a good one then. Last one 2 days ago. Please advise!

## 2014-08-14 MED ORDER — LINACLOTIDE 290 MCG PO CAPS: 290.0000 ug | ORAL_CAPSULE | Freq: Every day | ORAL | Status: DC

## 2014-08-14 NOTE — Telephone Encounter (Signed)
APPOINTMENT MADE AND PATIENT CALLED, AWARE OF DATE AND TIME

## 2014-08-14 NOTE — Telephone Encounter (Signed)
Patient has not been seen since October 2015. We can increase to 290mcg daily. New RX sent.  Please schedule follow up OV, nonurgent.

## 2014-08-14 NOTE — Telephone Encounter (Signed)
Routing to Stacey to schedule appt.  

## 2014-08-14 NOTE — Addendum Note (Signed)
Addended by: Tiffany KocherLEWIS, Demonta Wombles S on: 08/14/2014 01:04 PM   Modules accepted: Orders

## 2014-08-14 NOTE — Telephone Encounter (Signed)
PT is aware. OK to schedule appt.  

## 2014-08-28 NOTE — Telephone Encounter (Signed)
Medicine was changed to Naprosyn

## 2014-09-08 ENCOUNTER — Ambulatory Visit (INDEPENDENT_AMBULATORY_CARE_PROVIDER_SITE_OTHER): Payer: Medicaid Other | Admitting: Gastroenterology

## 2014-09-08 ENCOUNTER — Encounter: Payer: Self-pay | Admitting: Gastroenterology

## 2014-09-08 VITALS — BP 123/75 | HR 71 | Temp 97.1°F | Ht 66.0 in | Wt 127.6 lb

## 2014-09-08 DIAGNOSIS — K5909 Other constipation: Secondary | ICD-10-CM

## 2014-09-08 DIAGNOSIS — K5900 Constipation, unspecified: Secondary | ICD-10-CM | POA: Insufficient documentation

## 2014-09-08 MED ORDER — LUBIPROSTONE 24 MCG PO CAPS: 24.0000 ug | ORAL_CAPSULE | Freq: Two times a day (BID) | ORAL | Status: DC

## 2014-09-08 NOTE — Progress Notes (Signed)
cc'ed to pcp °

## 2014-09-08 NOTE — Patient Instructions (Signed)
1. Stop Linzess. 2. Start Amitiza twice daily with food for constipation.  3. Please let me know if you continue to have abdominal pain or constipation is still poorly managed. 4. Office visit as needed.

## 2014-09-08 NOTE — Assessment & Plan Note (Signed)
24 year old female with one-year history of predominance of constipation associated with abdominal bloating and abdominal cramping. Had been on Linzess 145 g with good relief. Recent increase to 290 g for 3 weeks has not shown any provided benefit. Discussed changing medication at this time. She is interested in pursuing prior to any further workup. We'll try Amitiza 24 g twice a day with food. Prescription provided. If her constipation is well managed but she continues to have abdominal pain and bloating she will let us know. If her constipation is not well managed she will let us know. Office visit when necessary.

## 2014-09-08 NOTE — Progress Notes (Signed)
      Primary Care Physician: Harlow AsaLUKING,W S, MD  Primary Gastroenterologist:  Jonette EvaSandi Fields, MD   Chief Complaint  Patient presents with  . Follow-up    HPI: Melanie Gonzales is a 24 y.o. female here for follow up of constipation. She was last seen in October 2015 for IBS and GERD. She called in recently reporting ongoing constipation, Linzess not as effective as before. We increased her dose to 290 g daily. She's been on that dose for about 3 weeks. Continues to have a bowel movement about every 2 days but does not get good relief. Complains of associated abdominal cramping, feels like contractions especially before bowel movement. Often will have relief after bowel movement. Abdominal cramping can last for several hours if she's not able to have a bowel movement. Rarely has easy stool. Bristol stool 1-2 use mostly. Constipation now occurring for about one year. Denies associated melena or rectal bleeding. Appetite is fine. Denies heartburn. Also wonders about abdominal bloating and painful intercourse and if any relationship to her constipation. Recently had GYN evaluation last month without any notable issues. She is on Depo shot every 3 months for birth control.  Current Outpatient Prescriptions  Medication Sig Dispense Refill  . Linaclotide (LINZESS) 290 MCG CAPS capsule Take 1 capsule (290 mcg total) by mouth daily. 30 capsule 3  . medroxyPROGESTERone (DEPO-PROVERA) 150 MG/ML injection INJECT 1 ML INTO THE MUSCLE EVERY 3 MONTHS. 1 mL 3   No current facility-administered medications for this visit.    Allergies as of 09/08/2014 - Review Complete 09/08/2014  Allergen Reaction Noted  . Penicillins Hives 08/13/2012    ROS:  General: Negative for anorexia, weight loss, fever, chills, fatigue, weakness. ENT: Negative for hoarseness, difficulty swallowing , nasal congestion. CV: Negative for chest pain, angina, palpitations, dyspnea on exertion, peripheral edema.  Respiratory: Negative  for dyspnea at rest, dyspnea on exertion, cough, sputum, wheezing.  GI: See history of present illness. GU:  Negative for dysuria, hematuria, urinary incontinence, urinary frequency, nocturnal urination.  Endo: Negative for unusual weight change.    Physical Examination:   BP 123/75 mmHg  Pulse 71  Temp(Src) 97.1 F (36.2 C)  Ht 5\' 6"  (1.676 m)  Wt 127 lb 9.6 oz (57.879 kg)  BMI 20.61 kg/m2  General: Well-nourished, well-developed in no acute distress.  Eyes: No icterus. Mouth: Oropharyngeal mucosa moist and pink , no lesions erythema or exudate. Lungs: Clear to auscultation bilaterally.  Heart: Regular rate and rhythm, no murmurs rubs or gallops.  Abdomen: Bowel sounds are normal, nontender, nondistended, no hepatosplenomegaly or masses, no abdominal bruits or hernia , no rebound or guarding.   Extremities: No lower extremity edema. No clubbing or deformities. Neuro: Alert and oriented x 4   Skin: Warm and dry, no jaundice.   Psych: Alert and cooperative, normal mood and affect.  Labs:  Lab Results  Component Value Date   TSH 1.000 07/17/2014    Imaging Studies: No results found.

## 2014-10-02 ENCOUNTER — Encounter: Payer: Self-pay | Admitting: *Deleted

## 2014-10-02 ENCOUNTER — Ambulatory Visit (INDEPENDENT_AMBULATORY_CARE_PROVIDER_SITE_OTHER): Payer: Medicaid Other | Admitting: *Deleted

## 2014-10-02 DIAGNOSIS — Z3200 Encounter for pregnancy test, result unknown: Secondary | ICD-10-CM

## 2014-10-02 DIAGNOSIS — Z3042 Encounter for surveillance of injectable contraceptive: Secondary | ICD-10-CM

## 2014-10-02 DIAGNOSIS — Z3202 Encounter for pregnancy test, result negative: Secondary | ICD-10-CM

## 2014-10-02 LAB — POCT URINE PREGNANCY: PREG TEST UR: NEGATIVE

## 2014-10-02 MED ORDER — MEDROXYPROGESTERONE ACETATE 150 MG/ML IM SUSP
150.0000 mg | Freq: Once | INTRAMUSCULAR | Status: AC
  Administered 2014-10-02: 150 mg via INTRAMUSCULAR

## 2014-10-02 NOTE — Progress Notes (Signed)
Patient ID: Melanie GosselinKrishawna Gonzales, female   DOB: 09-30-1990, 24 y.o.   MRN: [id number] Pt here today for DEPO injection. UPT today is negative. Pt tolerated injection well.

## 2014-12-07 ENCOUNTER — Emergency Department (HOSPITAL_COMMUNITY)
Admission: EM | Admit: 2014-12-07 | Discharge: 2014-12-07 | Disposition: A | Discharge status: Home / Self Care | Payer: Medicaid Other | Attending: Emergency Medicine | Admitting: Emergency Medicine

## 2014-12-07 ENCOUNTER — Encounter (HOSPITAL_COMMUNITY): Payer: Self-pay

## 2014-12-07 DIAGNOSIS — Z79899 Other long term (current) drug therapy: Secondary | ICD-10-CM | POA: Insufficient documentation

## 2014-12-07 DIAGNOSIS — Z8742 Personal history of other diseases of the female genital tract: Secondary | ICD-10-CM | POA: Insufficient documentation

## 2014-12-07 DIAGNOSIS — Z8679 Personal history of other diseases of the circulatory system: Secondary | ICD-10-CM | POA: Diagnosis not present

## 2014-12-07 DIAGNOSIS — R05 Cough: Secondary | ICD-10-CM | POA: Diagnosis present

## 2014-12-07 DIAGNOSIS — R059 Cough, unspecified: Secondary | ICD-10-CM

## 2014-12-07 DIAGNOSIS — Z8719 Personal history of other diseases of the digestive system: Secondary | ICD-10-CM | POA: Diagnosis not present

## 2014-12-07 DIAGNOSIS — Z880 Allergy status to penicillin: Secondary | ICD-10-CM | POA: Diagnosis not present

## 2014-12-07 DIAGNOSIS — J029 Acute pharyngitis, unspecified: Secondary | ICD-10-CM | POA: Insufficient documentation

## 2014-12-07 DIAGNOSIS — R0602 Shortness of breath: Secondary | ICD-10-CM | POA: Diagnosis not present

## 2014-12-07 DIAGNOSIS — Z87891 Personal history of nicotine dependence: Secondary | ICD-10-CM | POA: Insufficient documentation

## 2014-12-07 NOTE — ED Notes (Signed)
Having a cough that is getting worse. Coughed up yellow stuff this morning per pt. Having a decreased appetite per pt. Also having a headache.

## 2014-12-07 NOTE — ED Provider Notes (Signed)
CSN: [id number]     Arrival date & time 12/07/14  0025 History  This chart was scribed for Melanie Rhineonald Hoang Reich, MD by Phillis HaggisGabriella Gaje, ED Scribe. This patient was seen in room APA07/APA07 and patient care was started at 12:38 AM.    Chief Complaint  Patient presents with  . Cough   Patient is a 24 y.o. female presenting with cough. The history is provided by the patient. No language interpreter was used.  Cough Cough characteristics:  Productive Sputum characteristics:  Yellow Severity:  Moderate Onset quality:  Sudden Timing:  Constant Progression:  Worsening Chronicity:  New Smoker: no   Ineffective treatments:  Cough suppressants Associated symptoms: chills, diaphoresis, headaches, shortness of breath and sore throat   Associated symptoms: no fever    HPI Comments: Melanie Gonzales is a 24 y.o. female who presents to the Emergency Department complaining of constant, gradually worsening cough onset 4 days ago. States that she coughed up yellow phlegm this morning. Reports associated chills, sore throat, nausea, SOB, headache, and diaphoresis. She denies vomiting, hemoptysis, and fever. Pt denies being a smoker. States that she is worried about her daughter, who has medical problems, catching the cough.   Past Medical History  Diagnosis Date  . Hx of migraines 08/13/2012  . Migraines   . GERD (gastroesophageal reflux disease)   . Nipple discharge in female 07/15/2014   Past Surgical History  Procedure Laterality Date  . No past surgeries     Family History  Problem Relation Age of Onset  . Deep vein thrombosis Father   . Hypertension Paternal Grandmother   . Thyroid disease Paternal Grandmother     has had thyroid removed  . Ulcers Mother   . GER disease Mother   . Gallbladder disease Neg Hx   . Colon cancer Neg Hx   . Crohn's disease Neg Hx   . Seizures Daughter   . Hyperthyroidism Daughter   . Other Daughter     meningitis 2 weeks after birth   History  Substance Use  Topics  . Smoking status: Former Smoker    Types: Cigars  . Smokeless tobacco: Never Used  . Alcohol Use: No   OB History    Gravida Para Term Preterm AB TAB SAB Ectopic Multiple Living   1 1 1       1      Review of Systems  Constitutional: Positive for chills, diaphoresis and appetite change. Negative for fever.  HENT: Positive for sore throat.   Respiratory: Positive for cough and shortness of breath.   Neurological: Positive for headaches.  All other systems reviewed and are negative.  Allergies  Penicillins  Home Medications   Prior to Admission medications   Medication Sig Start Date End Date Taking? Authorizing Provider  medroxyPROGESTERone (DEPO-PROVERA) 150 MG/ML injection INJECT 1 ML INTO THE MUSCLE EVERY 3 MONTHS. 04/17/14  Yes Jacklyn ShellFrances Cresenzo-Dishmon, CNM  lubiprostone (AMITIZA) 24 MCG capsule Take 1 capsule (24 mcg total) by mouth 2 (two) times daily with a meal. 09/08/14   Tiffany KocherLeslie S Lewis, PA-C   BP 127/88 mmHg  Pulse 79  Temp(Src) 98.9 F (37.2 C) (Oral)  Resp 20  Ht 5\' 6"  (1.676 m)  Wt 128 lb (58.06 kg)  BMI 20.67 kg/m2  SpO2 99% Physical Exam CONSTITUTIONAL: Well developed/well nourished HEAD: Normocephalic/atraumatic EYES: EOMI/PERRL ENMT: Mucous membranes moist; uvula midline, no erythema or exudates NECK: supple no meningeal signs SPINE/BACK:entire spine nontender CV: S1/S2 noted, no murmurs/rubs/gallops noted LUNGS: Lungs are clear to  auscultation bilaterally, no apparent distress ABDOMEN: soft, nontender, no rebound or guarding NEURO: Pt is awake/alert/appropriate, moves all extremitiesx4.  No facial droop.   EXTREMITIES: pulses normal/equal, full ROM SKIN: warm, color normal PSYCH: no abnormalities of mood noted, alert and oriented to situation  ED Course  Procedures  DIAGNOSTIC STUDIES: Oxygen Saturation is 99% on RA, normal by my interpretation.    COORDINATION OF CARE: 12:41 AM-Discussed treatment plan which includes face masks and  washing hands frequently with continuing use of OTC medications with pt at bedside and pt agreed to plan.    MDM   Final diagnoses:  Cough    Nursing notes including past medical history and social history reviewed and considered in documentation  I personally performed the services described in this documentation, which was scribed in my presence. The recorded information has been reviewed and is accurate.       Melanie Rhine, MD 12/07/14 [phone]

## 2014-12-07 NOTE — Discharge Instructions (Signed)
Cough, Adult   A cough is a reflex. It helps you clear your throat and airways. A cough can help heal your body. A cough can last 2 or 3 weeks (acute) or may last more than 8 weeks (chronic). Some common causes of a cough can include an infection, allergy, or a cold.  HOME CARE  · Only take medicine as told by your doctor.  · If given, take your medicines (antibiotics) as told. Finish them even if you start to feel better.  · Use a cold steam vaporizer or humidifier in your home. This can help loosen thick spit (secretions).  · Sleep so you are almost sitting up (semi-upright). Use pillows to do this. This helps reduce coughing.  · Rest as needed.  · Stop smoking if you smoke.  GET HELP RIGHT AWAY IF:  · You have yellowish-white fluid (pus) in your thick spit.  · Your cough gets worse.  · Your medicine does not reduce coughing, and you are losing sleep.  · You cough up blood.  · You have trouble breathing.  · Your pain gets worse and medicine does not help.  · You have a fever.  MAKE SURE YOU:   · Understand these instructions.  · Will watch your condition.  · Will get help right away if you are not doing well or get worse.  Document Released: 01/27/2011 Document Revised: 09/30/2013 Document Reviewed: 01/27/2011  ExitCare® Patient Information ©2015 [employer]. This information is not intended to replace advice given to you by your health care provider. Make sure you discuss any questions you have with your health care provider.

## 2014-12-07 NOTE — ED Notes (Signed)
Patient verbalizes understanding of discharge instructions, home care an follow up care if needed. Patient ambulatory out of department at this time.

## 2014-12-25 ENCOUNTER — Ambulatory Visit (INDEPENDENT_AMBULATORY_CARE_PROVIDER_SITE_OTHER): Payer: Medicaid Other | Admitting: *Deleted

## 2014-12-25 ENCOUNTER — Encounter: Payer: Self-pay | Admitting: *Deleted

## 2014-12-25 DIAGNOSIS — Z3202 Encounter for pregnancy test, result negative: Secondary | ICD-10-CM | POA: Diagnosis not present

## 2014-12-25 DIAGNOSIS — Z3042 Encounter for surveillance of injectable contraceptive: Secondary | ICD-10-CM | POA: Diagnosis not present

## 2014-12-25 DIAGNOSIS — Z3200 Encounter for pregnancy test, result unknown: Secondary | ICD-10-CM

## 2014-12-25 LAB — POCT URINE PREGNANCY: Preg Test, Ur: NEGATIVE

## 2014-12-25 MED ORDER — MEDROXYPROGESTERONE ACETATE 150 MG/ML IM SUSP
150.0000 mg | Freq: Once | INTRAMUSCULAR | Status: AC
Start: 2014-12-25 — End: 2014-12-25
  Administered 2014-12-25: 150 mg via INTRAMUSCULAR

## 2014-12-25 NOTE — Progress Notes (Signed)
Patient ID: Melanie Gonzales, female   DOB: Jun 10, 1990, 24 y.o.   MRN: [id number] Pt here today for DEPO injection. Pt denies any problems or concerns at this time. Pt had negative UPT and DEPO was given and tolerated well.

## 2015-03-11 ENCOUNTER — Telehealth: Payer: Self-pay | Admitting: Family Medicine

## 2015-03-11 MED ORDER — PREDNISONE 20 MG PO TABS: ORAL_TABLET | ORAL | Status: DC

## 2015-03-11 NOTE — Telephone Encounter (Signed)
Adult pred taper plus appt fri pm with caroly

## 2015-03-11 NOTE — Telephone Encounter (Signed)
Patient notified that med was sent to pharmacy. Transferred to front desk to schedule appointment.

## 2015-03-11 NOTE — Telephone Encounter (Signed)
Pt is having issue with her sciatic or  her lower back, some times Travels up to the middle of her back. Not having the same pain as  Last time with her sciatic. Wants to know if she can have an appt for Friday after  Noon or can she get the prednisone an possible a pain med called  In to help her pain. She states she is also getting a numbness in  Her left foot with this pain.   Advised she will most likely need to be seen   [facility]

## 2015-03-13 ENCOUNTER — Ambulatory Visit (INDEPENDENT_AMBULATORY_CARE_PROVIDER_SITE_OTHER): Payer: Medicaid Other | Admitting: Nurse Practitioner

## 2015-03-13 VITALS — BP 110/80 | Ht 66.0 in | Wt 131.2 lb

## 2015-03-13 DIAGNOSIS — R3915 Urgency of urination: Secondary | ICD-10-CM | POA: Diagnosis not present

## 2015-03-13 DIAGNOSIS — M545 Low back pain, unspecified: Secondary | ICD-10-CM

## 2015-03-13 LAB — POCT URINALYSIS DIPSTICK
PH UA: 5
SPEC GRAV UA: 1.015
Urobilinogen, UA: 2

## 2015-03-13 MED ORDER — CHLORZOXAZONE 500 MG PO TABS: 500.0000 mg | ORAL_TABLET | Freq: Three times a day (TID) | ORAL | Status: DC | PRN

## 2015-03-13 NOTE — Patient Instructions (Signed)
Icy hot smart relief TENS unit 

## 2015-03-15 ENCOUNTER — Encounter: Payer: Self-pay | Admitting: Nurse Practitioner

## 2015-03-15 NOTE — Progress Notes (Signed)
Subjective:   Presents for complaints of generalized low back pain which is been constant for the past 2 weeks. Was seen previously for sciatica on the left side which has resolved.  No specific history of injury.Has a disabled daughter who weighs about 43 pounds. Has been doing frequent lifting. Minimal lower abdominal pain. Bowels normal limit. No dysuria. Some urgency and frequency. On Depo-Provera for birth control. Rare spotting. Same sexual partner. No vaginal discharge pelvic pain or fever.  Getting ready to start a prednisone taper that was prescribed on 10/12.  Objective:   BP 110/80 mmHg  Ht 5\' 6"  (1.676 m)  Wt 131 lb 4 oz (59.535 kg)  BMI 21.19 kg/m2  NAD. Alert, oriented. Lungs clear. Heart regular rate rhythm. No CVA or flank tenderness. Abdomen soft nondistended nontender. Mild tenderness in the lumbar paraspinal area near the sacroiliac joint  More on the left. SLR negative bilateral. Reflexes normal limit lower extremities. Good ROM of the back with minimal limitation. Gait normal. Results for orders placed or performed in visit on 03/13/15  POCT urinalysis dipstick  Result Value Ref Range   Color, UA     Clarity, UA     Glucose, UA     Bilirubin, UA +    Ketones, UA     Spec Grav, UA 1.015    Blood, UA     pH, UA 5.0    Protein, UA     Urobilinogen, UA 2.0    Nitrite, UA     Leukocytes, UA  Negative     Assessment: Bilateral low back pain without sciatica - Plan: POCT urinalysis dipstick  Urinary urgency - Plan: POCT urinalysis dipstick  Plan:  Meds ordered this encounter  Medications  . chlorzoxazone (PARAFON) 500 MG tablet    Sig: Take 1 tablet (500 mg total) by mouth 3 (three) times daily as needed for muscle spasms.    Dispense:  30 tablet    Refill:  0    Order Specific Question:  Supervising Provider    Answer:  Merlyn AlbertLUKING, WILLIAM S [[id number]]     Prednisone as previously directed. Chlorzoxazone as directed for muscle spasms. Drowsiness precautions. Ice/heat  applications. Given copy of back exercises. Also given prescription for a tens unit. Patient to try this regimen for 7-10 days and if no improvement, to call back to our office. Call back sooner if symptoms worsen.

## 2015-03-19 ENCOUNTER — Encounter: Payer: Self-pay | Admitting: *Deleted

## 2015-03-19 ENCOUNTER — Ambulatory Visit (INDEPENDENT_AMBULATORY_CARE_PROVIDER_SITE_OTHER): Payer: Medicaid Other | Admitting: *Deleted

## 2015-03-19 ENCOUNTER — Telehealth: Payer: Self-pay | Admitting: *Deleted

## 2015-03-19 ENCOUNTER — Other Ambulatory Visit: Payer: Self-pay | Admitting: Advanced Practice Midwife

## 2015-03-19 DIAGNOSIS — Z3202 Encounter for pregnancy test, result negative: Secondary | ICD-10-CM

## 2015-03-19 DIAGNOSIS — Z3042 Encounter for surveillance of injectable contraceptive: Secondary | ICD-10-CM | POA: Diagnosis not present

## 2015-03-19 LAB — POCT URINE PREGNANCY: PREG TEST UR: NEGATIVE

## 2015-03-19 MED ORDER — MEDROXYPROGESTERONE ACETATE 150 MG/ML IM SUSP
150.0000 mg | Freq: Once | INTRAMUSCULAR | Status: AC
  Administered 2015-03-19: 150 mg via INTRAMUSCULAR

## 2015-03-19 MED ORDER — MEDROXYPROGESTERONE ACETATE 150 MG/ML IM SUSP: 150.0000 mg | INTRAMUSCULAR | Status: DC

## 2015-03-19 NOTE — Progress Notes (Signed)
Pt here for Depo. Reports no problems at this time. Return in 12 weeks for next shot. JSY 

## 2015-03-19 NOTE — Telephone Encounter (Signed)
Depo Provera 150 mg e-scribed.

## 2015-03-19 NOTE — Telephone Encounter (Signed)
done

## 2015-03-26 ENCOUNTER — Telehealth: Payer: Self-pay | Admitting: Family Medicine

## 2015-03-26 NOTE — Telephone Encounter (Signed)
Patient was seen recently for back pain by Melanie Gonzales.  Melanie Gonzales says that the medication prescribed is not helping her and wants to know if there is something else that she can have prescribed that she can use when needed.

## 2015-03-27 ENCOUNTER — Other Ambulatory Visit: Payer: Self-pay | Admitting: Nurse Practitioner

## 2015-03-27 MED ORDER — TRAMADOL HCL 50 MG PO TABS: 50.0000 mg | ORAL_TABLET | Freq: Three times a day (TID) | ORAL | Status: DC | PRN

## 2015-03-27 NOTE — Telephone Encounter (Signed)
Did muscle relaxant help? Is she applying ice or heat? Anti inflammatories?

## 2015-03-27 NOTE — Telephone Encounter (Signed)
Patient stated that the muscle relaxant didn't not help and neither did prednisone or antiinflammatories. Patient trying heat and ice and tens unit but they only help in the moment. Patient  Interested in pain meds.

## 2015-06-11 ENCOUNTER — Ambulatory Visit (INDEPENDENT_AMBULATORY_CARE_PROVIDER_SITE_OTHER): Payer: Medicaid Other | Admitting: *Deleted

## 2015-06-11 ENCOUNTER — Encounter: Payer: Self-pay | Admitting: *Deleted

## 2015-06-11 DIAGNOSIS — Z3200 Encounter for pregnancy test, result unknown: Secondary | ICD-10-CM

## 2015-06-11 DIAGNOSIS — Z3042 Encounter for surveillance of injectable contraceptive: Secondary | ICD-10-CM | POA: Diagnosis not present

## 2015-06-11 DIAGNOSIS — Z3202 Encounter for pregnancy test, result negative: Secondary | ICD-10-CM | POA: Diagnosis not present

## 2015-06-11 LAB — POCT URINE PREGNANCY: PREG TEST UR: NEGATIVE

## 2015-06-11 MED ORDER — MEDROXYPROGESTERONE ACETATE 150 MG/ML IM SUSP
150.0000 mg | Freq: Once | INTRAMUSCULAR | Status: AC
  Administered 2015-06-11: 150 mg via INTRAMUSCULAR

## 2015-06-11 NOTE — Progress Notes (Signed)
Patient ID: Melanie GosselinKrishawna Gonzales, female   DOB: Oct 28, 1990, 25 y.o.   MRN: [id number] Pt here today for DEPO injection. Pt had negative UPT today. Pt tolerated well.

## 2015-09-03 ENCOUNTER — Encounter: Payer: Self-pay | Admitting: *Deleted

## 2015-09-03 ENCOUNTER — Ambulatory Visit (INDEPENDENT_AMBULATORY_CARE_PROVIDER_SITE_OTHER): Payer: Medicaid Other | Admitting: *Deleted

## 2015-09-03 DIAGNOSIS — Z3042 Encounter for surveillance of injectable contraceptive: Secondary | ICD-10-CM | POA: Diagnosis not present

## 2015-09-03 DIAGNOSIS — Z3202 Encounter for pregnancy test, result negative: Secondary | ICD-10-CM

## 2015-09-03 LAB — POCT URINE PREGNANCY: PREG TEST UR: NEGATIVE

## 2015-09-03 MED ORDER — MEDROXYPROGESTERONE ACETATE 150 MG/ML IM SUSP
150.0000 mg | Freq: Once | INTRAMUSCULAR | Status: AC
  Administered 2015-09-03: 150 mg via INTRAMUSCULAR

## 2015-09-03 NOTE — Progress Notes (Signed)
Pt here for Depo. Pt tolerated shot well. Return in 12 weeks for next shot. JSY 

## 2015-11-26 ENCOUNTER — Encounter: Payer: Self-pay | Admitting: *Deleted

## 2015-11-26 ENCOUNTER — Ambulatory Visit (INDEPENDENT_AMBULATORY_CARE_PROVIDER_SITE_OTHER): Payer: Medicaid Other | Admitting: *Deleted

## 2015-11-26 DIAGNOSIS — Z3200 Encounter for pregnancy test, result unknown: Secondary | ICD-10-CM

## 2015-11-26 DIAGNOSIS — Z3202 Encounter for pregnancy test, result negative: Secondary | ICD-10-CM

## 2015-11-26 DIAGNOSIS — Z3042 Encounter for surveillance of injectable contraceptive: Secondary | ICD-10-CM | POA: Diagnosis not present

## 2015-11-26 LAB — POCT URINE PREGNANCY: Preg Test, Ur: NEGATIVE

## 2015-11-26 MED ORDER — MEDROXYPROGESTERONE ACETATE 150 MG/ML IM SUSP
150.0000 mg | Freq: Once | INTRAMUSCULAR | Status: AC
  Administered 2015-11-26: 150 mg via INTRAMUSCULAR

## 2015-11-26 NOTE — Progress Notes (Signed)
Patient ID: Melanie GosselinKrishawna Montminy, female   DOB: [birth year], 25 y.o.   MRN: [id number] Pt here today for DEPO injection . Pt tolerated well.

## 2015-12-10 ENCOUNTER — Other Ambulatory Visit (HOSPITAL_COMMUNITY)
Admission: RE | Admit: 2015-12-10 | Discharge: 2015-12-10 | Disposition: A | Discharge status: Home / Self Care | Payer: Medicaid Other | Source: Ambulatory Visit | Attending: Adult Health | Admitting: Adult Health

## 2015-12-10 ENCOUNTER — Ambulatory Visit (INDEPENDENT_AMBULATORY_CARE_PROVIDER_SITE_OTHER): Payer: Medicaid Other | Admitting: Adult Health

## 2015-12-10 ENCOUNTER — Encounter: Payer: Self-pay | Admitting: Adult Health

## 2015-12-10 VITALS — BP 108/80 | HR 92 | Ht 64.0 in | Wt 130.0 lb

## 2015-12-10 DIAGNOSIS — Z01419 Encounter for gynecological examination (general) (routine) without abnormal findings: Secondary | ICD-10-CM | POA: Diagnosis not present

## 2015-12-10 DIAGNOSIS — Z1151 Encounter for screening for human papillomavirus (HPV): Secondary | ICD-10-CM | POA: Insufficient documentation

## 2015-12-10 DIAGNOSIS — Z01411 Encounter for gynecological examination (general) (routine) with abnormal findings: Secondary | ICD-10-CM | POA: Diagnosis present

## 2015-12-10 DIAGNOSIS — Z0000 Encounter for general adult medical examination without abnormal findings: Secondary | ICD-10-CM

## 2015-12-10 DIAGNOSIS — Z113 Encounter for screening for infections with a predominantly sexual mode of transmission: Secondary | ICD-10-CM | POA: Insufficient documentation

## 2015-12-10 DIAGNOSIS — Z87898 Personal history of other specified conditions: Secondary | ICD-10-CM

## 2015-12-10 DIAGNOSIS — Z3042 Encounter for surveillance of injectable contraceptive: Secondary | ICD-10-CM

## 2015-12-10 DIAGNOSIS — IMO0002 Reserved for concepts with insufficient information to code with codable children: Secondary | ICD-10-CM

## 2015-12-10 DIAGNOSIS — Z124 Encounter for screening for malignant neoplasm of cervix: Secondary | ICD-10-CM

## 2015-12-10 HISTORY — DX: Reserved for concepts with insufficient information to code with codable children: IMO0002

## 2015-12-10 HISTORY — DX: Personal history of other specified conditions: Z87.898

## 2015-12-10 MED ORDER — MEDROXYPROGESTERONE ACETATE 150 MG/ML IM SUSP: 150.0000 mg | INTRAMUSCULAR | Status: DC

## 2015-12-10 NOTE — Progress Notes (Signed)
Patient ID: Melanie Gonzales, female   DOB: 03/09/1991, 25 y.o.   MRN: [id number] History of Present Illness: Melanie Gonzales is a 25 year old black female in for a well woman gyn exam and pap,her pap last year was ASCUS with negative HPV.She says she has white to yellowish nipple discharge at times and breast sore at times.Had normal TSH and Prolactin last year.She is on depo and happy with it.   Current Medications, Allergies, Past Medical History, Past Surgical History, Family History and Social History were reviewed in [facility] electronic medical record.     Review of Systems: Patient denies any headaches, hearing loss, fatigue, blurred vision, shortness of breath, chest pain, abdominal pain, problems with bowel movements, urination, or intercourse. No joint pain or mood swings.See HPI for positives.    Physical Exam:BP 108/80 mmHg  Pulse 92  Ht 5\' 4"  (1.626 m)  Wt 130 lb (58.968 kg)  BMI 22.30 kg/m2 General:  Well developed, well nourished, no acute distress Skin:  Warm and dry Neck:  Midline trachea, normal thyroid, good ROM, no lymphadenopathy Lungs; Clear to auscultation bilaterally Breast:  No dominant palpable mass, retraction, or nipple discharge Cardiovascular: Regular rate and rhythm Abdomen:  Soft, non tender, no hepatosplenomegaly Pelvic:  External genitalia is normal in appearance, no lesions.  The vagina is normal in appearance. Urethra has no lesions or masses. The cervix is bulbous.Pap with GC/CHL and HPV performed.  Uterus is felt to be normal size, shape, and contour.  No adnexal masses or tenderness noted.Bladder is non tender, no masses felt. Extremities/musculoskeletal:  No swelling or varicosities noted, no clubbing or cyanosis Psych:  No mood changes, alert and cooperative,seems happy Decrease caffeine and if nipple discharge increases call me, can recheck TSH and Prolactin.   Impression: Well woman gyn exam and pap Contraceptive management-depo Hx ASCUS  pap -HPV Hx nipple discharge     Plan: Physical in 1 year, pap in 3 if normal  Rx Depo provera 150 mg, disp.# 1 vial for IM injection every 3 months in office with 3 refills Decrease caffeine and do not over stimulate breasts

## 2015-12-10 NOTE — Patient Instructions (Signed)
Physical in 1 year,pap in 3 if normal 

## 2015-12-14 LAB — CYTOLOGY - PAP

## 2015-12-19 ENCOUNTER — Telehealth: Payer: Self-pay | Admitting: Adult Health

## 2015-12-19 NOTE — Telephone Encounter (Signed)
Left message pap wasw ASCUS but negative HPV just repeat in 1 year and GC/CHL was negative

## 2016-02-18 ENCOUNTER — Ambulatory Visit (INDEPENDENT_AMBULATORY_CARE_PROVIDER_SITE_OTHER): Payer: Medicaid Other | Admitting: *Deleted

## 2016-02-18 ENCOUNTER — Encounter: Payer: Self-pay | Admitting: *Deleted

## 2016-02-18 DIAGNOSIS — Z3202 Encounter for pregnancy test, result negative: Secondary | ICD-10-CM

## 2016-02-18 DIAGNOSIS — Z3042 Encounter for surveillance of injectable contraceptive: Secondary | ICD-10-CM

## 2016-02-18 LAB — POCT URINE PREGNANCY: PREG TEST UR: NEGATIVE

## 2016-02-18 MED ORDER — MEDROXYPROGESTERONE ACETATE 150 MG/ML IM SUSP
150.0000 mg | Freq: Once | INTRAMUSCULAR | Status: AC
  Administered 2016-02-18: 150 mg via INTRAMUSCULAR

## 2016-02-18 NOTE — Progress Notes (Signed)
Pt here for Depo. Pt tolerated shot well. Return in 12 weeks for next shot. JSY 

## 2016-03-03 ENCOUNTER — Telehealth: Payer: Self-pay | Admitting: *Deleted

## 2016-03-03 NOTE — Telephone Encounter (Signed)
Pt called today having eye pain and swelling. No eye drainage, no trouble with vision. Offered appt today. Pt declined. Wants appt tomorrow. Scheduled appt. Pt advised if worse tonight needs to go directly to emergency dept.

## 2016-03-04 ENCOUNTER — Ambulatory Visit (INDEPENDENT_AMBULATORY_CARE_PROVIDER_SITE_OTHER): Payer: Medicaid Other | Admitting: Nurse Practitioner

## 2016-03-04 ENCOUNTER — Encounter: Payer: Self-pay | Admitting: Nurse Practitioner

## 2016-03-04 VITALS — BP 110/70 | Temp 98.4°F | Ht 66.0 in | Wt 131.2 lb

## 2016-03-04 DIAGNOSIS — G43009 Migraine without aura, not intractable, without status migrainosus: Secondary | ICD-10-CM

## 2016-03-04 DIAGNOSIS — H0014 Chalazion left upper eyelid: Secondary | ICD-10-CM | POA: Diagnosis not present

## 2016-03-04 MED ORDER — RIZATRIPTAN BENZOATE 10 MG PO TBDP: 10.0000 mg | ORAL_TABLET | ORAL | 2 refills | Status: DC | PRN

## 2016-03-04 MED ORDER — CLINDAMYCIN HCL 300 MG PO CAPS: 300.0000 mg | ORAL_CAPSULE | Freq: Three times a day (TID) | ORAL | 0 refills | Status: DC

## 2016-03-04 MED ORDER — TOPIRAMATE 50 MG PO TABS: 50.0000 mg | ORAL_TABLET | Freq: Every day | ORAL | 2 refills | Status: DC

## 2016-03-04 MED ORDER — SULFACETAMIDE SODIUM 10 % OP SOLN: 1.0000 [drp] | OPHTHALMIC | 0 refills | Status: DC

## 2016-03-04 MED ORDER — NAPROXEN 500 MG PO TABS: 500.0000 mg | ORAL_TABLET | Freq: Two times a day (BID) | ORAL | 0 refills | Status: DC

## 2016-03-05 ENCOUNTER — Encounter: Payer: Self-pay | Admitting: Nurse Practitioner

## 2016-03-05 DIAGNOSIS — G43009 Migraine without aura, not intractable, without status migrainosus: Secondary | ICD-10-CM | POA: Insufficient documentation

## 2016-03-05 NOTE — Progress Notes (Signed)
Subjective:  Presents for complaints of swelling in the left upper eyelid that began 2 days ago. Slight crusting/drainage in the mornings. Mild itching. Mildly tender. No eye pain or visual changes. No fever. No history of injury. Also complaints of a flareup of her migraines for the past 3 months. Having about 3-4 migraines per week. No change in symptomatology. Last seen for this in 2015. Denies any excessive use of OTC analgesics, has had a problem with this in the past. Would like to restart medications. 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 Has not identified any specific triggers for migraines increasing over the past 3 months.  Objective:   BP 110/70   Temp 98.4 F (36.9 C) (Oral)   Ht 5\' 6"  (1.676 m)   Wt 131 lb 4 oz (59.5 kg)   BMI 21.18 kg/m  NAD. Alert, oriented. TMs minimal clear effusion, no erythema. Pharynx clear. Neck supple with mild soft anterior adenopathy. Lungs clear. Heart regular rate rhythm. Conjunctiva clear bile at. Edema noted in the left upper mid outer eyelid. A small nonraised lesion with erythema noted on the inner left upper eyelid. No preauricular adenopathy. No tenderness noted around the eye.  Assessment:  Problem List Items Addressed This Visit      Cardiovascular and Mediastinum   Migraine without aura and without status migrainosus, not intractable   Relevant Medications   rizatriptan (MAXALT-MLT) 10 MG disintegrating tablet   topiramate (TOPAMAX) 50 MG tablet   naproxen (NAPROSYN) 500 MG tablet    Other Visit Diagnoses    Chalazion left upper eyelid    -  Primary     Plan:  Meds ordered this encounter  Medications  . clindamycin (CLEOCIN) 300 MG capsule    Sig: Take 1 capsule (300 mg total) by mouth 3 (three) times daily.    Dispense:  21 capsule    Refill:  0    Order Specific Question:   Supervising Provider    Answer:   Merlyn Albert [[id number]]  . sulfacetamide  (BLEPH-10) 10 % ophthalmic solution    Sig: Place 1 drop into the left eye every 2 (two) hours while awake. Then QID starting tomorrow    Dispense:  10 mL    Refill:  0    Order Specific Question:   Supervising Provider    Answer:   Merlyn Albert [[id number]]  . rizatriptan (MAXALT-MLT) 10 MG disintegrating tablet    Sig: Take 1 tablet (10 mg total) by mouth as needed for migraine. May repeat in 2 hours if needed; max 2 per 24 hours    Dispense:  10 tablet    Refill:  2    Order Specific Question:   Supervising Provider    Answer:   Merlyn Albert [[id number]]  . topiramate (TOPAMAX) 50 MG tablet    Sig: Take 1 tablet (50 mg total) by mouth daily. At bedtime    Dispense:  30 tablet    Refill:  2    Order Specific Question:   Supervising Provider    Answer:   Merlyn Albert [[id number]]  . naproxen (NAPROSYN) 500 MG tablet    Sig: Take 1 tablet (500 mg total) by mouth 2 (two) times daily with a meal. Prn migraine    Dispense:  30 tablet    Refill:  0    Order Specific Question:   Supervising Provider  Answer:   Merlyn AlbertLUKING, WILLIAM S [[id number]]   Warm compresses to left. Warning signs reviewed. Call back in 72 hours if no improvement, call or go to ED sooner if worse. Restart daily Topamax as directed. Recommend headache diary, call back if no improvement in the number of her headaches or symptoms change. Take Maxalt with naproxen at onset of migraine. Recheck in 2-3 months, call back sooner if needed.

## 2016-04-01 ENCOUNTER — Telehealth: Payer: Self-pay | Admitting: *Deleted

## 2016-04-01 NOTE — Telephone Encounter (Signed)
Pt states she has a yeast infection and wants a medication sent to her pharmacy.  Advised pt to try Monistat 7 OTC, pt states she can not afford to buy it.  Informed pt would need OV before RX could be sent.  Pt states she will call back for OV.

## 2016-05-12 ENCOUNTER — Ambulatory Visit (INDEPENDENT_AMBULATORY_CARE_PROVIDER_SITE_OTHER): Payer: Medicaid Other | Admitting: *Deleted

## 2016-05-12 ENCOUNTER — Encounter: Payer: Self-pay | Admitting: *Deleted

## 2016-05-12 DIAGNOSIS — Z308 Encounter for other contraceptive management: Secondary | ICD-10-CM

## 2016-05-12 DIAGNOSIS — Z3202 Encounter for pregnancy test, result negative: Secondary | ICD-10-CM

## 2016-05-12 LAB — POCT URINE PREGNANCY: PREG TEST UR: NEGATIVE

## 2016-05-12 MED ORDER — MEDROXYPROGESTERONE ACETATE 150 MG/ML IM SUSP
150.0000 mg | Freq: Once | INTRAMUSCULAR | Status: AC
  Administered 2016-05-12: 150 mg via INTRAMUSCULAR

## 2016-05-12 NOTE — Progress Notes (Signed)
Pt here for Depo. Pt tolerated shot well. Return in 12 weeks for next shot. JSY 

## 2016-06-07 ENCOUNTER — Ambulatory Visit (INDEPENDENT_AMBULATORY_CARE_PROVIDER_SITE_OTHER): Payer: Medicaid Other | Admitting: Adult Health

## 2016-06-07 ENCOUNTER — Encounter: Payer: Self-pay | Admitting: Adult Health

## 2016-06-07 VITALS — BP 133/75 | HR 81 | Ht 66.0 in | Wt 147.5 lb

## 2016-06-07 DIAGNOSIS — B379 Candidiasis, unspecified: Secondary | ICD-10-CM | POA: Diagnosis not present

## 2016-06-07 DIAGNOSIS — N898 Other specified noninflammatory disorders of vagina: Secondary | ICD-10-CM

## 2016-06-07 DIAGNOSIS — L298 Other pruritus: Secondary | ICD-10-CM | POA: Diagnosis not present

## 2016-06-07 LAB — POCT WET PREP (WET MOUNT)
CLUE CELLS WET PREP WHIFF POC: NEGATIVE
TRICHOMONAS WET PREP HPF POC: ABSENT
WBC WET PREP: POSITIVE

## 2016-06-07 MED ORDER — FLUCONAZOLE 150 MG PO TABS: ORAL_TABLET | ORAL | 1 refills | Status: DC

## 2016-06-07 NOTE — Patient Instructions (Addendum)
Take diflucan No sex for now, use condoms  Follow up prn

## 2016-06-07 NOTE — Progress Notes (Signed)
Subjective:     Patient ID: Melanie Gonzales, female   DOB: 1990/09/17, 26 y.o.   MRN: [id number]  HPI Melanie Gonzales is a 26 year old black female in complaining of vaginal itching and irritation and had white discharge Saturday.No new products or sex partners.   Review of Systems +vaginal itching +Vaginal discharge +Vaginal irritation  Reviewed past medical,surgical, social and family history. Reviewed medications and allergies.     Objective:   Physical Exam BP 133/75 (BP Location: Left Arm, Patient Position: Sitting, Cuff Size: Normal)   Pulse 81   Ht 5\' 6"  (1.676 m)   Wt 147 lb 8 oz (66.9 kg)   BMI 23.81 kg/m   PHQ 2 score 1. Skin warm and dry.Pelvic: external genitalia is normal in appearance no lesions, vagina: white discharge without odor,side walls red,urethra has no lesions or masses noted, cervix:smooth, uterus: normal size, shape and contour, non tender, no masses felt, adnexa: no masses or tenderness noted. Bladder is non tender and no masses felt. Wet prep: + for yeast and +WBCs. GC/CHL obtained.    Discussed will give diflucan because monistat burns, and no sex for now and use condoms regularly. Face time 15 minutes.  Assessment:     1. Vaginal discharge   2. Vaginal itching   3. Vaginal irritation   4. Yeast infection       Plan:     GC/CHLsent Rx diflucan 150 mg #2 take 1 now and 1 in 3 days, with 1 refill No sex for now  Use condoms Follow up prn

## 2016-06-09 LAB — GC/CHLAMYDIA PROBE AMP
CHLAMYDIA, DNA PROBE: NEGATIVE
Neisseria gonorrhoeae by PCR: NEGATIVE

## 2016-08-04 ENCOUNTER — Ambulatory Visit (INDEPENDENT_AMBULATORY_CARE_PROVIDER_SITE_OTHER): Payer: Medicaid Other | Admitting: *Deleted

## 2016-08-04 ENCOUNTER — Encounter: Payer: Self-pay | Admitting: *Deleted

## 2016-08-04 DIAGNOSIS — Z3202 Encounter for pregnancy test, result negative: Secondary | ICD-10-CM | POA: Diagnosis not present

## 2016-08-04 DIAGNOSIS — Z3042 Encounter for surveillance of injectable contraceptive: Secondary | ICD-10-CM | POA: Diagnosis not present

## 2016-08-04 LAB — POCT URINE PREGNANCY: PREG TEST UR: NEGATIVE

## 2016-08-04 MED ORDER — MEDROXYPROGESTERONE ACETATE 150 MG/ML IM SUSP
150.0000 mg | Freq: Once | INTRAMUSCULAR | Status: AC
  Administered 2016-08-04: 150 mg via INTRAMUSCULAR

## 2016-08-04 NOTE — Progress Notes (Signed)
Depo Provera 15o mg IM given left ventrogluteal without complications. Instructed pt to return in 12 weeks for next injection. Pt verbalized understanding.

## 2016-08-17 ENCOUNTER — Other Ambulatory Visit: Payer: Self-pay | Admitting: Nurse Practitioner

## 2016-08-17 MED ORDER — NAPROXEN 500 MG PO TABS: 500.0000 mg | ORAL_TABLET | Freq: Two times a day (BID) | ORAL | 0 refills | Status: DC

## 2016-10-27 ENCOUNTER — Ambulatory Visit (INDEPENDENT_AMBULATORY_CARE_PROVIDER_SITE_OTHER): Payer: Medicaid Other

## 2016-10-27 VITALS — Wt 145.4 lb

## 2016-10-27 DIAGNOSIS — Z3042 Encounter for surveillance of injectable contraceptive: Secondary | ICD-10-CM

## 2016-10-27 DIAGNOSIS — Z3202 Encounter for pregnancy test, result negative: Secondary | ICD-10-CM

## 2016-10-27 LAB — POCT URINE PREGNANCY: Preg Test, Ur: NEGATIVE

## 2016-10-27 MED ORDER — MEDROXYPROGESTERONE ACETATE 150 MG/ML IM SUSP
150.0000 mg | Freq: Once | INTRAMUSCULAR | Status: AC
  Administered 2016-10-27: 150 mg via INTRAMUSCULAR

## 2016-10-27 NOTE — Progress Notes (Signed)
PT here for depo shot 150 mg IM given RT Ventrogluteal. Tolerated well. Return 12 weeks for next shot.pad CMA

## 2016-12-12 ENCOUNTER — Other Ambulatory Visit (HOSPITAL_COMMUNITY)
Admission: RE | Admit: 2016-12-12 | Discharge: 2016-12-12 | Disposition: A | Discharge status: Home / Self Care | Payer: Medicaid Other | Source: Ambulatory Visit | Attending: Adult Health | Admitting: Adult Health

## 2016-12-12 ENCOUNTER — Encounter: Payer: Self-pay | Admitting: Adult Health

## 2016-12-12 ENCOUNTER — Ambulatory Visit (INDEPENDENT_AMBULATORY_CARE_PROVIDER_SITE_OTHER): Payer: Medicaid Other | Admitting: Adult Health

## 2016-12-12 VITALS — BP 100/70 | HR 76 | Ht 64.4 in | Wt 144.0 lb

## 2016-12-12 DIAGNOSIS — R87610 Atypical squamous cells of undetermined significance on cytologic smear of cervix (ASC-US): Secondary | ICD-10-CM | POA: Diagnosis not present

## 2016-12-12 DIAGNOSIS — Z01419 Encounter for gynecological examination (general) (routine) without abnormal findings: Secondary | ICD-10-CM | POA: Insufficient documentation

## 2016-12-12 DIAGNOSIS — Z3042 Encounter for surveillance of injectable contraceptive: Secondary | ICD-10-CM

## 2016-12-12 DIAGNOSIS — Z0000 Encounter for general adult medical examination without abnormal findings: Secondary | ICD-10-CM

## 2016-12-12 MED ORDER — MEDROXYPROGESTERONE ACETATE 150 MG/ML IM SUSP: 150.0000 mg | INTRAMUSCULAR | 4 refills | Status: DC

## 2016-12-12 NOTE — Progress Notes (Signed)
Patient ID: Melanie Melanie Gonzales, female   DOB: July 10, 1990, 26 y.o.   MRN: [id number] History of Present Illness: Melanie Melanie Gonzales is a 26 year old black female in for well woman gyn exam and pap.Her last pap was ASCUS with negative HPV 12/10/15.   Current Medications, Allergies, Past Medical History, Past Surgical History, Family History and Social History were reviewed in Melanie Melanie Gonzales.     Review of Systems: Patient denies any headaches, hearing loss, fatigue, blurred vision, shortness of breath, chest pain, abdominal pain, problems with bowel movements, urination, or intercourse. No joint pain.Mioody at times and has decreased libido and moisture on depo.     Physical Exam:BP 100/70 (BP Location: Left Arm, Patient Position: Sitting, Cuff Size: Small)   Pulse 76   Ht 5' 4.4" (1.636 m)   Wt 144 lb (65.3 kg)   BMI 24.41 kg/m  General:  Well developed, well nourished, no acute distress Skin:  Warm and dry Neck:  Midline trachea, normal thyroid, good ROM, no lymphadenopathy Lungs; Clear to auscultation bilaterally Breast:  No dominant palpable mass, retraction, or nipple discharge Cardiovascular: Regular rate and rhythm Abdomen:  Soft, non tender, no hepatosplenomegaly Pelvic:  External genitalia is normal in appearance, no lesions.  The vagina is normal in appearance. Urethra has no lesions or masses. The cervix is bulbous.  Pap with CG/CHL and reflex HPV performed. Uterus is felt to be normal size, shape, and contour.  No adnexal masses or tenderness noted.Bladder is non tender, no masses felt. Extremities/musculoskeletal:  No swelling or varicosities noted, no clubbing or cyanosis Psych:  No mood changes, alert and cooperative,seems happy PHQ 2 score 1.  Impression: 1. Encounter for gynecological examination with Papanicolaou smear of cervix   2. Encounter for surveillance of injectable contraceptive       Plan: Meds ordered this encounter  Medications  .  medroxyPROGESTERone (DEPO-PROVERA) 150 MG/ML injection    Sig: Inject 1 mL (150 mg total) into the muscle every 3 (three) months.    Dispense:  1 mL    Refill:  4    Order Specific Question:   Supervising Provider    Answer:   Lazaro ArmsEURE, LUTHER H [[id number]]  Physical in year,pap in 3 if normal Try [place] Jelly with sex

## 2016-12-12 NOTE — Addendum Note (Signed)
Addended by: Federico FlakeNES, Jersi Mcmaster A on: 12/12/2016 05:09 PM   Modules accepted: Orders

## 2016-12-15 LAB — CYTOLOGY - PAP
Chlamydia: NEGATIVE
Diagnosis: UNDETERMINED — AB
HPV (WINDOPATH): NOT DETECTED
Neisseria Gonorrhea: NEGATIVE

## 2017-01-19 ENCOUNTER — Ambulatory Visit (INDEPENDENT_AMBULATORY_CARE_PROVIDER_SITE_OTHER): Payer: Medicaid Other | Admitting: *Deleted

## 2017-01-19 ENCOUNTER — Encounter: Payer: Self-pay | Admitting: *Deleted

## 2017-01-19 ENCOUNTER — Telehealth: Payer: Self-pay | Admitting: *Deleted

## 2017-01-19 DIAGNOSIS — Z3202 Encounter for pregnancy test, result negative: Secondary | ICD-10-CM | POA: Diagnosis not present

## 2017-01-19 DIAGNOSIS — Z3042 Encounter for surveillance of injectable contraceptive: Secondary | ICD-10-CM

## 2017-01-19 DIAGNOSIS — Z308 Encounter for other contraceptive management: Secondary | ICD-10-CM

## 2017-01-19 LAB — POCT URINE PREGNANCY: PREG TEST UR: NEGATIVE

## 2017-01-19 MED ORDER — MEDROXYPROGESTERONE ACETATE 150 MG/ML IM SUSP
150.0000 mg | Freq: Once | INTRAMUSCULAR | Status: AC
  Administered 2017-01-19: 150 mg via INTRAMUSCULAR

## 2017-01-19 NOTE — Progress Notes (Signed)
Pt here for Depo. Pt tolerated shot well. Return in 12 weeks for next shot. JSY 

## 2017-01-19 NOTE — Telephone Encounter (Signed)
Patient states pharmacy does not have any refills for Depo. Called pharmacy who states prescription is ready for pickup. Pt informed.

## 2017-02-01 ENCOUNTER — Encounter: Payer: Self-pay | Admitting: Nurse Practitioner

## 2017-02-01 ENCOUNTER — Ambulatory Visit (INDEPENDENT_AMBULATORY_CARE_PROVIDER_SITE_OTHER): Payer: Medicaid Other | Admitting: Nurse Practitioner

## 2017-02-01 VITALS — BP 130/84 | Ht 66.0 in | Wt 139.2 lb

## 2017-02-01 DIAGNOSIS — G43009 Migraine without aura, not intractable, without status migrainosus: Secondary | ICD-10-CM | POA: Diagnosis not present

## 2017-02-01 DIAGNOSIS — G8929 Other chronic pain: Secondary | ICD-10-CM | POA: Diagnosis not present

## 2017-02-01 DIAGNOSIS — M545 Low back pain, unspecified: Secondary | ICD-10-CM

## 2017-02-01 MED ORDER — TIZANIDINE HCL 4 MG PO TABS
4.0000 mg | ORAL_TABLET | Freq: Three times a day (TID) | ORAL | 0 refills | Status: DC | PRN
Start: 2017-02-01 — End: 2017-07-07

## 2017-02-01 MED ORDER — RIZATRIPTAN BENZOATE 10 MG PO TBDP: 10.0000 mg | ORAL_TABLET | ORAL | 2 refills | Status: DC | PRN

## 2017-02-01 MED ORDER — TOPIRAMATE 50 MG PO TABS: 50.0000 mg | ORAL_TABLET | Freq: Every day | ORAL | 5 refills | Status: DC

## 2017-02-01 MED ORDER — DICLOFENAC SODIUM 75 MG PO TBEC: 75.0000 mg | DELAYED_RELEASE_TABLET | Freq: Two times a day (BID) | ORAL | 0 refills | Status: DC

## 2017-02-01 NOTE — Patient Instructions (Signed)
Lidocaine patches Biofreeze

## 2017-02-02 ENCOUNTER — Telehealth: Payer: Self-pay | Admitting: Nurse Practitioner

## 2017-02-02 ENCOUNTER — Encounter: Payer: Self-pay | Admitting: Nurse Practitioner

## 2017-02-02 DIAGNOSIS — G8929 Other chronic pain: Secondary | ICD-10-CM | POA: Insufficient documentation

## 2017-02-02 DIAGNOSIS — M545 Low back pain, unspecified: Secondary | ICD-10-CM | POA: Insufficient documentation

## 2017-02-02 MED ORDER — MELOXICAM 15 MG PO TABS: 15.0000 mg | ORAL_TABLET | Freq: Every day | ORAL | 2 refills | Status: DC

## 2017-02-02 NOTE — Progress Notes (Signed)
Subjective:  Presents for recheck on her migraines. Much better. Only occurring every 2-3 months. Ran out of Topamax. Would like to restart. Takes rare Maxalt which helps her migraines. Has chronic low back pain with flare up x 2 weeks. Has a disabled daughter weighing about 51 lbs that she has to lift. Is working on getting some help in the home. Has a TENS unit at home which she has used. Due to her life situation, is limited on what she can do to help her back pain. On Depo Provera for birth control.   Objective:   BP 130/84   Ht 5\' 6"  (1.676 m)   Wt 139 lb 4 oz (63.2 kg)   BMI 22.48 kg/m  NAD. Alert, oriented. Lungs clear. Heart RRR. Mild generalized tenderness lumbar area. SLR neg bilat. Patellar reflexes normal. Gait normal.   Assessment:   Problem List Items Addressed This Visit      Cardiovascular and Mediastinum   Migraine without aura and without status migrainosus, not intractable - Primary   Relevant Medications   rizatriptan (MAXALT-MLT) 10 MG disintegrating tablet   topiramate (TOPAMAX) 50 MG tablet   tiZANidine (ZANAFLEX) 4 MG tablet   meloxicam (MOBIC) 15 MG tablet     Other   Chronic bilateral low back pain without sciatica   Relevant Medications   tiZANidine (ZANAFLEX) 4 MG tablet   meloxicam (MOBIC) 15 MG tablet   Other Relevant Orders   DG Lumbar Spine Complete        Plan:   Meds ordered this encounter  Medications  . rizatriptan (MAXALT-MLT) 10 MG disintegrating tablet    Sig: Take 1 tablet (10 mg total) by mouth as needed for migraine. May repeat in 2 hours if needed; max 2 per 24 hours    Dispense:  10 tablet    Refill:  2    Order Specific Question:   Supervising Provider    Answer:   Merlyn AlbertLUKING, WILLIAM S [[id number]]  . topiramate (TOPAMAX) 50 MG tablet    Sig: Take 1 tablet (50 mg total) by mouth daily. At bedtime    Dispense:  30 tablet    Refill:  5    Order Specific Question:   Supervising Provider    Answer:   Merlyn AlbertLUKING, WILLIAM S [[id number]]  . DISCONTD:  diclofenac (VOLTAREN) 75 MG EC tablet    Sig: Take 1 tablet (75 mg total) by mouth 2 (two) times daily. Prn pain    Dispense:  30 tablet    Refill:  0    Order Specific Question:   Supervising Provider    Answer:   Merlyn AlbertLUKING, WILLIAM S [[id number]]  . tiZANidine (ZANAFLEX) 4 MG tablet    Sig: Take 1 tablet (4 mg total) by mouth every 8 (eight) hours as needed for muscle spasms.    Dispense:  30 tablet    Refill:  0    Order Specific Question:   Supervising Provider    Answer:   Merlyn AlbertLUKING, WILLIAM S [[id number]]  . meloxicam (MOBIC) 15 MG tablet    Sig: Take 1 tablet (15 mg total) by mouth daily. Prn pain    Dispense:  30 tablet    Refill:  2    Order Specific Question:   Supervising Provider    Answer:   Merlyn AlbertLUKING, WILLIAM S [[id number]]   Restart TENS unit; ice/heat applications; back exercises. Defers PT or xray at this time. Call back if worsens or persists.  Return in about 6 months (around 08/01/2017)  for migraines.  25 minutes was spent with the patient. Greater than half the time was spent in discussion and answering questions and counseling regarding the issues that the patient came in for today.

## 2017-02-02 NOTE — Telephone Encounter (Signed)
Spoke with patient and informed her that mloxicam was sent into the pharmacy. Patient verbalized understanding,

## 2017-02-02 NOTE — Telephone Encounter (Signed)
Switched to Meloxicam. Thanks.

## 2017-02-02 NOTE — Telephone Encounter (Signed)
Patient's Dic[place]of[place]ac [place] [facility] due to being a non preferred medications. Medications that are preferred are Meloxicam , Ketorolac, and Naproxen. Please advise?

## 2017-04-13 ENCOUNTER — Other Ambulatory Visit: Payer: Self-pay

## 2017-04-13 ENCOUNTER — Encounter: Payer: Self-pay | Admitting: *Deleted

## 2017-04-13 ENCOUNTER — Other Ambulatory Visit: Payer: Self-pay | Admitting: Adult Health

## 2017-04-13 ENCOUNTER — Ambulatory Visit (INDEPENDENT_AMBULATORY_CARE_PROVIDER_SITE_OTHER): Payer: Medicaid Other | Admitting: *Deleted

## 2017-04-13 DIAGNOSIS — Z3042 Encounter for surveillance of injectable contraceptive: Secondary | ICD-10-CM

## 2017-04-13 DIAGNOSIS — Z3202 Encounter for pregnancy test, result negative: Secondary | ICD-10-CM

## 2017-04-13 LAB — POCT URINE PREGNANCY: PREG TEST UR: NEGATIVE

## 2017-04-13 MED ORDER — MEDROXYPROGESTERONE ACETATE 150 MG/ML IM SUSP
150.0000 mg | Freq: Once | INTRAMUSCULAR | Status: AC
  Administered 2017-04-13: 150 mg via INTRAMUSCULAR

## 2017-04-13 NOTE — Progress Notes (Signed)
Pt given DepoProvera 150mg IM right deltoid without complications. Advised pt to return in 12 weeks for next injection.  

## 2017-07-06 ENCOUNTER — Encounter: Payer: Self-pay | Admitting: *Deleted

## 2017-07-06 ENCOUNTER — Ambulatory Visit (INDEPENDENT_AMBULATORY_CARE_PROVIDER_SITE_OTHER): Payer: Medicaid Other | Admitting: *Deleted

## 2017-07-06 DIAGNOSIS — Z3202 Encounter for pregnancy test, result negative: Secondary | ICD-10-CM | POA: Diagnosis not present

## 2017-07-06 DIAGNOSIS — Z3042 Encounter for surveillance of injectable contraceptive: Secondary | ICD-10-CM | POA: Diagnosis not present

## 2017-07-06 DIAGNOSIS — Z308 Encounter for other contraceptive management: Secondary | ICD-10-CM

## 2017-07-06 LAB — POCT URINE PREGNANCY: PREG TEST UR: NEGATIVE

## 2017-07-06 MED ORDER — MEDROXYPROGESTERONE ACETATE 150 MG/ML IM SUSP
150.0000 mg | Freq: Once | INTRAMUSCULAR | Status: AC
  Administered 2017-07-06: 150 mg via INTRAMUSCULAR

## 2017-07-06 NOTE — Progress Notes (Signed)
Pt here for Depo. Pt tolerated shot well. Return in 12 weeks for next shot. JSY 

## 2017-07-07 ENCOUNTER — Other Ambulatory Visit: Payer: Self-pay | Admitting: Nurse Practitioner

## 2017-07-07 MED ORDER — TIZANIDINE HCL 4 MG PO TABS: 4.0000 mg | ORAL_TABLET | Freq: Three times a day (TID) | ORAL | 0 refills | Status: DC | PRN

## 2017-08-23 ENCOUNTER — Encounter: Payer: Self-pay | Admitting: Nurse Practitioner

## 2017-08-23 ENCOUNTER — Ambulatory Visit (INDEPENDENT_AMBULATORY_CARE_PROVIDER_SITE_OTHER): Payer: Medicaid Other | Admitting: Nurse Practitioner

## 2017-08-23 ENCOUNTER — Ambulatory Visit (HOSPITAL_COMMUNITY)
Admission: RE | Admit: 2017-08-23 | Discharge: 2017-08-23 | Disposition: A | Discharge status: Home / Self Care | Payer: Medicaid Other | Source: Ambulatory Visit | Attending: Nurse Practitioner | Admitting: Nurse Practitioner

## 2017-08-23 VITALS — BP 128/84 | Ht 66.0 in | Wt 139.6 lb

## 2017-08-23 DIAGNOSIS — G43009 Migraine without aura, not intractable, without status migrainosus: Secondary | ICD-10-CM | POA: Diagnosis not present

## 2017-08-23 DIAGNOSIS — G8929 Other chronic pain: Secondary | ICD-10-CM | POA: Diagnosis present

## 2017-08-23 DIAGNOSIS — M545 Low back pain, unspecified: Secondary | ICD-10-CM

## 2017-08-23 MED ORDER — KETOROLAC TROMETHAMINE 10 MG PO TABS: 10.0000 mg | ORAL_TABLET | Freq: Four times a day (QID) | ORAL | 0 refills | Status: DC | PRN

## 2017-08-23 MED ORDER — ONDANSETRON 4 MG PO TBDP: 4.0000 mg | ORAL_TABLET | Freq: Three times a day (TID) | ORAL | 0 refills | Status: DC | PRN

## 2017-08-23 NOTE — Patient Instructions (Addendum)
Take 2 Topiramate 50 mg per night (100 mg) [facility] [facility] [place] [facility] [facility]

## 2017-08-23 NOTE — Progress Notes (Signed)
Subjective: Presents for recheck on her migraine headaches and recurrent low back pain.  Migraines have greatly improved, still has about 2/week but not as bad.  Currently on Topamax 50 mg daily.  Is going to school.  Taking care of her disabled daughter although she has started receiving some help.  Maxalt does not help her migraines at all.  Also causes some neck pain when she takes it.  Occasional nausea.  No change in migraine symptomatology.  Has a history of recurrent back pain.  Some relief with TENS unit and heat but only while it is being applied.  Has also tried topical meds and Mobic with minimal improvement.  Also takes Zanaflex which helps some.  Pain is mainly along the generalized low back area.  Does not usually go down the legs.  No numbness or weakness of the legs.  Occurs about every week and may last for several days.  No specific triggers.  Does lift her daughter some but not as much as she used to.  Worse with prolonged sitting or standing.  Did not get x-ray previously ordered because her daughter was scheduled for surgery and she did not have time.  Objective:   BP 128/84   Ht 5\' 6"  (1.676 m)   Wt 139 lb 9.6 oz (63.3 kg)   BMI 22.53 kg/m  NAD.  Alert, oriented.  Calm cheerful affect.  Lungs clear.  Heart regular rate and rhythm.  Generalized tenderness in the sacroiliac area bilaterally.  SLR negative.  Reflexes normal limit lower extremities.  Gait normal limit.  Assessment:   Problem List Items Addressed This Visit      Cardiovascular and Mediastinum   Migraine without aura and without status migrainosus, not intractable - Primary   Relevant Medications   ketorolac (TORADOL) 10 MG tablet     Other   Chronic bilateral low back pain without sciatica   Relevant Medications   ketorolac (TORADOL) 10 MG tablet   Other Relevant Orders   DG Lumbar Spine Complete       Plan:   Meds ordered this encounter  Medications  . ketorolac (TORADOL) 10 MG tablet    Sig: Take 1  tablet (10 mg total) by mouth every 6 (six) hours as needed. For migraine    Dispense:  20 tablet    Refill:  0    Order Specific Question:   Supervising Provider    Answer:   Merlyn AlbertLUKING, WILLIAM S [[id number]]  . ondansetron (ZOFRAN ODT) 4 MG disintegrating tablet    Sig: Take 1 tablet (4 mg total) by mouth every 8 (eight) hours as needed for nausea or vomiting.    Dispense:  20 tablet    Refill:  0    Order Specific Question:   Supervising Provider    Answer:   Merlyn AlbertLUKING, WILLIAM S [[id number]]   Trial of Toradol as directed for migraines do not take more than 5 days at a time.  Zofran as directed for nausea.  Increase Topamax to 100 mg daily, may take at bedtime or split to twice daily dosing.  Patient will check into chiropractic care for her back, if her insurance will not cover this to call back to the office to look at physical therapy.  X-ray pending. Return in about 3 months (around 11/23/2017) for migraine check up. Call back sooner if any problems.   25 minutes was spent with the patient.  This statement verifies that 25 minutes was indeed spent with the patient. Greater  than half the time was spent in discussion, counseling and answering questions  regarding the issues that the patient came in for today as reflected in the diagnosis (s) please refer to documentation for further details.

## 2017-09-28 ENCOUNTER — Ambulatory Visit: Payer: Medicaid Other

## 2017-09-29 ENCOUNTER — Ambulatory Visit (INDEPENDENT_AMBULATORY_CARE_PROVIDER_SITE_OTHER): Payer: Medicaid Other

## 2017-09-29 VITALS — HR 97 | Ht 66.0 in | Wt 134.0 lb

## 2017-09-29 DIAGNOSIS — Z3202 Encounter for pregnancy test, result negative: Secondary | ICD-10-CM | POA: Diagnosis not present

## 2017-09-29 DIAGNOSIS — Z3042 Encounter for surveillance of injectable contraceptive: Secondary | ICD-10-CM | POA: Diagnosis not present

## 2017-09-29 LAB — POCT URINE PREGNANCY: PREG TEST UR: NEGATIVE

## 2017-09-29 MED ORDER — MEDROXYPROGESTERONE ACETATE 150 MG/ML IM SUSP
150.0000 mg | Freq: Once | INTRAMUSCULAR | Status: AC
  Administered 2017-09-29: 150 mg via INTRAMUSCULAR

## 2017-09-29 NOTE — Progress Notes (Signed)
Pt here for depo injection 150 mg IM given  Rt deltoid. Tolerated well. Return 12 weeks for next injection. Pad CMA 

## 2017-10-24 ENCOUNTER — Other Ambulatory Visit: Payer: Self-pay | Admitting: Nurse Practitioner

## 2017-10-25 MED ORDER — TIZANIDINE HCL 4 MG PO TABS: 4.0000 mg | ORAL_TABLET | Freq: Three times a day (TID) | ORAL | 0 refills | Status: DC | PRN

## 2017-10-25 MED ORDER — KETOROLAC TROMETHAMINE 10 MG PO TABS: 10.0000 mg | ORAL_TABLET | Freq: Four times a day (QID) | ORAL | 0 refills | Status: DC | PRN

## 2017-11-20 ENCOUNTER — Ambulatory Visit: Payer: Medicaid Other | Admitting: Nurse Practitioner

## 2017-12-20 ENCOUNTER — Other Ambulatory Visit: Payer: Self-pay | Admitting: Adult Health

## 2017-12-20 ENCOUNTER — Ambulatory Visit: Payer: Medicaid Other

## 2017-12-21 ENCOUNTER — Encounter: Payer: Self-pay | Admitting: *Deleted

## 2017-12-21 ENCOUNTER — Ambulatory Visit (INDEPENDENT_AMBULATORY_CARE_PROVIDER_SITE_OTHER): Payer: Medicaid Other | Admitting: *Deleted

## 2017-12-21 DIAGNOSIS — Z3042 Encounter for surveillance of injectable contraceptive: Secondary | ICD-10-CM

## 2017-12-21 DIAGNOSIS — Z308 Encounter for other contraceptive management: Secondary | ICD-10-CM

## 2017-12-21 DIAGNOSIS — Z3202 Encounter for pregnancy test, result negative: Secondary | ICD-10-CM

## 2017-12-21 LAB — POCT URINE PREGNANCY: Preg Test, Ur: NEGATIVE

## 2017-12-21 MED ORDER — MEDROXYPROGESTERONE ACETATE 150 MG/ML IM SUSP
150.0000 mg | Freq: Once | INTRAMUSCULAR | Status: AC
  Administered 2017-12-21: 150 mg via INTRAMUSCULAR

## 2017-12-21 NOTE — Progress Notes (Signed)
Pt here for depo. Pt received shot in left deltoid. Pt tolerated shot well. Return in 12 weeks for next shot. Pt reports having hair loss, no desire for sex, mood swings. I advised to schedule appt to discuss. Pt voiced understanding. JSY

## 2017-12-22 ENCOUNTER — Ambulatory Visit: Payer: Medicaid Other

## 2017-12-26 ENCOUNTER — Ambulatory Visit: Payer: Medicaid Other | Admitting: Adult Health

## 2017-12-26 ENCOUNTER — Encounter: Payer: Self-pay | Admitting: Adult Health

## 2017-12-26 VITALS — BP 128/81 | HR 83 | Ht 66.0 in | Wt 130.0 lb

## 2017-12-26 DIAGNOSIS — F419 Anxiety disorder, unspecified: Secondary | ICD-10-CM | POA: Diagnosis not present

## 2017-12-26 DIAGNOSIS — R6882 Decreased libido: Secondary | ICD-10-CM | POA: Insufficient documentation

## 2017-12-26 DIAGNOSIS — L659 Nonscarring hair loss, unspecified: Secondary | ICD-10-CM | POA: Diagnosis not present

## 2017-12-26 MED ORDER — BUSPIRONE HCL 5 MG PO TABS: 5.0000 mg | ORAL_TABLET | Freq: Three times a day (TID) | ORAL | 1 refills | Status: DC

## 2017-12-26 NOTE — Progress Notes (Signed)
  Subjective:     Patient ID: Melanie Gonzales, female   DOB: July 03, 1990, 27 y.o.   MRN: [id number]  HPI Melanie Gonzales is a 27 year old black female, single, G1P1 on depo, in complaining of decreased sex dive, hair loss and anxiety.She got last depo 12/21/17.She is going to school and has child with trach on ventilator at home. She is in 6 1`/2 year relationship.   Review of Systems +decreased sex drive +hair loss +anxiety, feels overwhelmed and over thinks things Has migraines without auras  Reviewed past medical,surgical, social and family history. Reviewed medications and allergies.     Objective:   Physical Exam BP 128/81 (BP Location: Right Arm, Patient Position: Sitting, Cuff Size: Normal)   Pulse 83   Ht 5\' 6"  (1.676 m)   Wt 130 lb (59 kg)   BMI 20.98 kg/m  Skin warm and dry. Neck: mid line trachea, normal thyroid, good ROM, no lymphadenopathy noted. Lungs: clear to ausculation bilaterally. Cardiovascular: regular rate and rhythm. PHQ 2 score 0. Discussed getting labs, trying buspar for anxiety and changing birth control when depo due again.     Assessment:     1. Anxiety   2. Decreased libido   3. Hair loss       Plan:    Check CBC,CMP,TSH  Meds ordered this encounter  Medications  . busPIRone (BUSPAR) 5 MG tablet    Sig: Take 1 tablet (5 mg total) by mouth 3 (three) times daily.    Dispense:  90 tablet    Refill:  1    Order Specific Question:   Supervising Provider    Answer:   Duane LopeEURE, LUTHER H [[id number]]  Return in 4 weeks for pap and physical    Review handout on nexplanon

## 2017-12-27 LAB — CBC
HEMOGLOBIN: 13.8 g/dL (ref 11.1–15.9)
Hematocrit: 41.8 % (ref 34.0–46.6)
MCH: 31 pg (ref 26.6–33.0)
MCHC: 33 g/dL (ref 31.5–35.7)
MCV: 94 fL (ref 79–97)
PLATELETS: 369 10*3/uL (ref 150–450)
RBC: 4.45 x10E6/uL (ref 3.77–5.28)
RDW: 11.7 % — ABNORMAL LOW (ref 12.3–15.4)
WBC: 4.8 10*3/uL (ref 3.4–10.8)

## 2017-12-27 LAB — COMPREHENSIVE METABOLIC PANEL
A/G RATIO: 1.3 (ref 1.2–2.2)
ALT: 10 IU/L (ref 0–32)
AST: 15 IU/L (ref 0–40)
Albumin: 4.6 g/dL (ref 3.5–5.5)
Alkaline Phosphatase: 80 IU/L (ref 39–117)
BILIRUBIN TOTAL: 0.6 mg/dL (ref 0.0–1.2)
BUN/Creatinine Ratio: 9 (ref 9–23)
BUN: 8 mg/dL (ref 6–20)
CALCIUM: 9.5 mg/dL (ref 8.7–10.2)
CHLORIDE: 106 mmol/L (ref 96–106)
CO2: 19 mmol/L — ABNORMAL LOW (ref 20–29)
Creatinine, Ser: 0.9 mg/dL (ref 0.57–1.00)
GFR, EST AFRICAN AMERICAN: 103 mL/min/{1.73_m2} (ref >59)
GFR, EST NON AFRICAN AMERICAN: 89 mL/min/{1.73_m2} (ref >59)
GLOBULIN, TOTAL: 3.5 g/dL (ref 1.5–4.5)
Glucose: 88 mg/dL (ref 65–99)
POTASSIUM: 4.1 mmol/L (ref 3.5–5.2)
SODIUM: 140 mmol/L (ref 134–144)
TOTAL PROTEIN: 8.1 g/dL (ref 6.0–8.5)

## 2017-12-27 LAB — TSH: TSH: 0.604 u[IU]/mL (ref 0.450–4.500)

## 2018-01-09 ENCOUNTER — Other Ambulatory Visit: Payer: Self-pay

## 2018-01-23 MED ORDER — KETOROLAC TROMETHAMINE 10 MG PO TABS: 10.0000 mg | ORAL_TABLET | Freq: Four times a day (QID) | ORAL | 0 refills | Status: DC | PRN

## 2018-01-24 ENCOUNTER — Ambulatory Visit: Payer: Medicaid Other | Admitting: Adult Health

## 2018-01-24 ENCOUNTER — Other Ambulatory Visit: Payer: Medicaid Other | Admitting: Adult Health

## 2018-01-24 ENCOUNTER — Encounter: Payer: Self-pay | Admitting: Adult Health

## 2018-01-24 ENCOUNTER — Other Ambulatory Visit (HOSPITAL_COMMUNITY)
Admission: RE | Admit: 2018-01-24 | Discharge: 2018-01-24 | Disposition: A | Discharge status: Home / Self Care | Payer: Medicaid Other | Source: Ambulatory Visit | Attending: Adult Health | Admitting: Adult Health

## 2018-01-24 VITALS — BP 117/75 | HR 83 | Ht 64.25 in | Wt 129.0 lb

## 2018-01-24 DIAGNOSIS — F419 Anxiety disorder, unspecified: Secondary | ICD-10-CM

## 2018-01-24 DIAGNOSIS — Z124 Encounter for screening for malignant neoplasm of cervix: Secondary | ICD-10-CM | POA: Insufficient documentation

## 2018-01-24 DIAGNOSIS — Z01419 Encounter for gynecological examination (general) (routine) without abnormal findings: Secondary | ICD-10-CM | POA: Diagnosis not present

## 2018-01-24 DIAGNOSIS — R6882 Decreased libido: Secondary | ICD-10-CM

## 2018-01-24 DIAGNOSIS — Z0000 Encounter for general adult medical examination without abnormal findings: Secondary | ICD-10-CM | POA: Diagnosis not present

## 2018-01-24 DIAGNOSIS — L659 Nonscarring hair loss, unspecified: Secondary | ICD-10-CM

## 2018-01-24 NOTE — Progress Notes (Signed)
Patient ID: Melanie Gonzales, female   DOB: 1990-06-22, 27 y.o.   MRN: [id number] History of Present Illness: Melanie Gonzales is a 27 year old black female in for well woman gyn exam and pap, she last pap was ASCUS with negative HPV 12/12/16. PCP is Melanie Gonzales.   Current Medications, Allergies, Past Medical History, Past Surgical History, Family History and Social History were reviewed in [facility].     Review of Systems: Patient denies any headaches, hearing loss, fatigue, blurred vision, shortness of breath, chest pain, abdominal pain, problems with bowel movements, urination, or intercourse. No joint pain or mood swings. +hair loss Decreased libido Anxiety is better, on buspar    Physical Exam:BP 117/75 (BP Location: Left Arm, Patient Position: Sitting, Cuff Size: Normal)   Pulse 83   Ht 5' 4.25" (1.632 m)   Wt 129 lb (58.5 kg)   BMI 21.97 kg/m  General:  Well developed, well nourished, no acute distress Skin:  Warm and dry Neck:  Midline trachea, normal thyroid, good ROM, no lymphadenopathy Lungs; Clear to auscultation bilaterally Breast:  No dominant palpable mass, retraction, or nipple discharge Cardiovascular: Regular rate and rhythm Abdomen:  Soft, non tender, no hepatosplenomegaly Pelvic:  External genitalia is normal in appearance, no lesions.  The vagina is normal in appearance. Urethra has no lesions or masses. The cervix is bulbous. Pap with GC/CHL performed. Uterus is felt to be normal size, shape, and contour.  No adnexal masses or tenderness noted.Bladder is non tender, no masses felt. Extremities/musculoskeletal:  No swelling or varicosities noted, no clubbing or cyanosis Psych:  No mood changes, alert and cooperative,seems happy PHQ 2 score 2, is on buspar. Discussed trying OCs, when next depo due and she agrees  Impression: 1. Encounter for gynecological examination with Papanicolaou smear of cervix   2. Routine cervical smear   3.  Anxiety   4. Decreased libido   5. Hair loss       Plan: Physical in 1 year Pap in 3 if normal Return endo f September to discuss starting OCs again  Continue Buspar has refills

## 2018-01-26 ENCOUNTER — Other Ambulatory Visit: Payer: Self-pay | Admitting: *Deleted

## 2018-01-26 LAB — CYTOLOGY - PAP
Adequacy: ABSENT
Chlamydia: NEGATIVE
Diagnosis: NEGATIVE
HPV (WINDOPATH): NOT DETECTED
Neisseria Gonorrhea: NEGATIVE

## 2018-01-26 MED ORDER — KETOROLAC TROMETHAMINE 10 MG PO TABS: 10.0000 mg | ORAL_TABLET | Freq: Four times a day (QID) | ORAL | 1 refills | Status: DC | PRN

## 2018-01-26 NOTE — Progress Notes (Signed)
Ok times one with one ref use sparfingly

## 2018-01-26 NOTE — Progress Notes (Signed)
Refill sent electronically to pharmacy. 

## 2018-02-26 ENCOUNTER — Other Ambulatory Visit: Payer: Self-pay

## 2018-02-26 ENCOUNTER — Encounter: Payer: Self-pay | Admitting: Adult Health

## 2018-02-26 ENCOUNTER — Ambulatory Visit: Payer: Medicaid Other | Admitting: Adult Health

## 2018-02-26 VITALS — BP 134/89 | HR 86 | Ht 64.0 in | Wt 129.0 lb

## 2018-02-26 DIAGNOSIS — Z30011 Encounter for initial prescription of contraceptive pills: Secondary | ICD-10-CM

## 2018-02-26 DIAGNOSIS — Z3202 Encounter for pregnancy test, result negative: Secondary | ICD-10-CM | POA: Diagnosis not present

## 2018-02-26 LAB — POCT URINE PREGNANCY: Preg Test, Ur: NEGATIVE

## 2018-02-26 MED ORDER — NORETHIN-ETH ESTRAD-FE BIPHAS 1 MG-10 MCG / 10 MCG PO TABS: 1.0000 | ORAL_TABLET | Freq: Every day | ORAL | 11 refills | Status: DC

## 2018-02-26 NOTE — Progress Notes (Signed)
  Subjective:     Patient ID: Melanie Gonzales, female   DOB: 12/05/1990, 27 y.o.   MRN: [id number]  HPI Melanie Gonzales is a 27 year old black female in wanting to switch from depo to OCs.  Review of Systems Has occasional headache and nausea in last week +hair loss and decrease libido on depo  Reviewed past medical,surgical, social and family history. Reviewed medications and allergies.      Objective:   Physical Exam BP 134/89 (BP Location: Left Arm, Patient Position: Sitting, Cuff Size: Normal)   Pulse 86   Ht 5\' 4"  (1.626 m)   Wt 129 lb (58.5 kg)   BMI 22.14 kg/m   UPT negative.Skin warm and dry.  Lungs: clear to ausculation bilaterally. Cardiovascular: regular rate and rhythm.   Start lo loestrin today and use condoms.  Assessment:     1. Encounter for initial prescription of contraceptive pills       Plan:     Meds ordered this encounter  Medications  . Norethindrone-Ethinyl Estradiol-Fe Biphas (LO LOESTRIN FE) 1 MG-10 MCG / 10 MCG tablet    Sig: Take 1 tablet by mouth daily. Take 1 daily by mouth    Dispense:  1 Package    Refill:  11    BIN [id number], PCN [id number], GRP [id number]    Order Specific Question:   Supervising Provider    Answer:   Melanie Gonzales [[id number]]  F/U in 3 months or sooner if needed

## 2018-02-26 NOTE — Patient Instructions (Signed)
Oral Contraception Use Oral contraceptive pills (OCPs) are medicines taken to prevent pregnancy. OCPs work by preventing the ovaries from releasing eggs. The hormones in OCPs also cause the cervical mucus to thicken, preventing the sperm from entering the uterus. The hormones also cause the uterine lining to become thin, not allowing a fertilized egg to attach to the inside of the uterus. OCPs are highly effective when taken exactly as prescribed. However, OCPs do not prevent sexually transmitted diseases (STDs). Safe sex practices, such as using condoms along with an OCP, can help prevent STDs. Before taking OCPs, you may have a physical exam and Pap test. Your health care provider may also order blood tests if necessary. Your health care provider will make sure you are a good candidate for oral contraception. Discuss with your health care provider the possible side effects of the OCP you may be prescribed. When starting an OCP, it can take 2 to 3 months for the body to adjust to the changes in hormone levels in your body. How to take oral contraceptive pills Your health care provider may advise you on how to start taking the first cycle of OCPs. Otherwise, you can:  Start on day 1 of your menstrual period. You will not need any backup contraceptive protection with this start time.  Start on the first Sunday after your menstrual period or the day you get your prescription. In these cases, you will need to use backup contraceptive protection for the first week.  Start the pill at any time of your cycle. If you take the pill within 5 days of the start of your period, you are protected against pregnancy right away. In this case, you will not need a backup form of birth control. If you start at any other time of your menstrual cycle, you will need to use another form of birth control for 7 days. If your OCP is the type called a minipill, it will protect you from pregnancy after taking it for 2 days (48  hours).  After you have started taking OCPs:  If you forget to take 1 pill, take it as soon as you remember. Take the next pill at the regular time.  If you miss 2 or more pills, call your health care provider because different pills have different instructions for missed doses. Use backup birth control until your next menstrual period starts.  If you use a 28-day pack that contains inactive pills and you miss 1 of the last 7 pills (pills with no hormones), it will not matter. Throw away the rest of the non-hormone pills and start a new pill pack.  No matter which day you start the OCP, you will always start a new pack on that same day of the week. Have an extra pack of OCPs and a backup contraceptive method available in case you miss some pills or lose your OCP pack. Follow these instructions at home:  Do not smoke.  Always use a condom to protect against STDs. OCPs do not protect against STDs.  Use a calendar to mark your menstrual period days.  Read the information and directions that came with your OCP. Talk to your health care provider if you have questions. Contact a health care provider if:  You develop nausea and vomiting.  You have abnormal vaginal discharge or bleeding.  You develop a rash.  You miss your menstrual period.  You are losing your hair.  You need treatment for mood swings or depression.  You   get dizzy when taking the OCP.  You develop acne from taking the OCP.  You become pregnant. Get help right away if:  You develop chest pain.  You develop shortness of breath.  You have an uncontrolled or severe headache.  You develop numbness or slurred speech.  You develop visual problems.  You develop pain, redness, and swelling in the legs. This information is not intended to replace advice given to you by your health care provider. Make sure you discuss any questions you have with your health care provider. Document Released: 05/05/2011 Document  Revised: 10/22/2015 Document Reviewed: 11/04/2012 Elsevier Interactive Patient Education  2017 Elsevier Inc.  

## 2018-03-15 ENCOUNTER — Ambulatory Visit: Payer: Medicaid Other

## 2018-03-24 ENCOUNTER — Other Ambulatory Visit: Payer: Self-pay

## 2018-03-24 ENCOUNTER — Encounter (HOSPITAL_COMMUNITY): Payer: Self-pay | Admitting: Emergency Medicine

## 2018-03-24 ENCOUNTER — Emergency Department (HOSPITAL_COMMUNITY)
Admission: EM | Admit: 2018-03-24 | Discharge: 2018-03-24 | Disposition: A | Discharge status: Home / Self Care | Payer: Medicaid Other | Attending: Emergency Medicine | Admitting: Emergency Medicine

## 2018-03-24 DIAGNOSIS — Z79899 Other long term (current) drug therapy: Secondary | ICD-10-CM | POA: Insufficient documentation

## 2018-03-24 DIAGNOSIS — R05 Cough: Secondary | ICD-10-CM | POA: Diagnosis not present

## 2018-03-24 DIAGNOSIS — M791 Myalgia, unspecified site: Secondary | ICD-10-CM | POA: Insufficient documentation

## 2018-03-24 DIAGNOSIS — Z87891 Personal history of nicotine dependence: Secondary | ICD-10-CM | POA: Diagnosis not present

## 2018-03-24 DIAGNOSIS — J029 Acute pharyngitis, unspecified: Secondary | ICD-10-CM | POA: Diagnosis not present

## 2018-03-24 LAB — GROUP A STREP BY PCR: Group A Strep by PCR: NOT DETECTED

## 2018-03-24 MED ORDER — MAGIC MOUTHWASH W/LIDOCAINE: 5.0000 mL | Freq: Three times a day (TID) | ORAL | 0 refills | Status: DC | PRN

## 2018-03-24 MED ORDER — IBUPROFEN 600 MG PO TABS
600.0000 mg | ORAL_TABLET | Freq: Four times a day (QID) | ORAL | 0 refills | Status: DC | PRN
Start: 2018-03-24 — End: 2018-05-28

## 2018-03-24 NOTE — ED Triage Notes (Signed)
Pt reports sore throat and not feeling well since Wednesday.

## 2018-03-24 NOTE — Discharge Instructions (Addendum)
Drink plenty of fluids.  Take the medication as directed.  Follow-up with your primary doctor for recheck.  Return to the ER for any worsening symptoms.

## 2018-03-24 NOTE — ED Provider Notes (Signed)
[facility] EMERGENCY DEPARTMENT Provider Note   CSN: [id number] Arrival date & time: 03/24/18  0908     History   Chief Complaint Chief Complaint  Patient presents with  . Sore Throat    HPI Megin Consalvo is a 27 y.o. female.  HPI   Semya Klinke is a 27 y.o. female who presents to the Emergency Department complaining of sore throat, cough and generalized body aches.  Symptoms have been present for 3 days.  Cough is described as intermittent and nonproductive.  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  Has been taking over-the-counter cough and cold medications without relief.  She denies known fever, shortness of breath, chest pain, abdominal pain vomiting or diarrhea.   Past Medical History:  Diagnosis Date  . GERD (gastroesophageal reflux disease)   . History of nipple discharge 12/10/2015   Call if increases  . 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 Hx of migraines 08/13/2012  . Migraines   . Nipple discharge in female 07/15/2014  . Vaginal Pap smear, abnormal     Patient Active Problem List   Diagnosis Date Noted  . Routine cervical smear 01/24/2018  . Encounter for gynecological examination with Papanicolaou smear of cervix 01/24/2018  . Anxiety 12/26/2017  . Decreased libido 12/26/2017  . Hair loss 12/26/2017  . Chronic bilateral low back pain without sciatica 02/02/2017  . Migraine without aura and without status migrainosus, not intractable 03/05/2016  . ASCUS favor benign 12/10/2015  . History of nipple discharge 12/10/2015  . Constipation 09/08/2014  . Nipple discharge in female 07/15/2014  . Abdominal pain, chronic, epigastric 03/10/2014  . GERD (gastroesophageal reflux disease) 10/11/2013  . Irritable bowel syndrome 10/11/2013  . 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 Molluscum contagiosum 12/18/2012  . Hx of migraines 08/13/2012    Past Surgical History:  Procedure Laterality Date  . NO PAST SURGERIES        OB History    Gravida  1   Para  1   Term  1   Preterm      AB      Living  1     SAB      TAB      Ectopic      Multiple      Live Births  1            Home Medications    Prior to Admission medications   Medication Sig Start Date End Date Taking? Authorizing Provider  busPIRone (BUSPAR) 5 MG tablet Take 1 tablet (5 mg total) by mouth 3 (three) times daily. 12/26/17   Adline Potter, NP  ketorolac (TORADOL) 10 MG tablet Take 1 tablet (10 mg total) by mouth every 6 (six) hours as needed. For migraine. Use sparingly 01/26/18   Merlyn Albert, MD  Norethindrone-Ethinyl Estradiol-Fe Biphas (LO LOESTRIN FE) 1 MG-10 MCG / 10 MCG tablet Take 1 tablet by mouth daily. Take 1 daily by mouth 02/26/18   Cyril Mourning A, NP  ondansetron (ZOFRAN ODT) 4 MG disintegrating tablet Take 1 tablet (4 mg total) by mouth every 8 (eight) hours as needed for nausea or vomiting. Patient not taking: Reported on 02/26/2018 08/23/17   Campbell Riches, NP  tiZANidine (ZANAFLEX) 4 MG tablet Take 1 tablet (4 mg total) by mouth every 8 (eight) hours as needed for muscle spasms. 10/25/17   Campbell Riches, NP  topiramate (TOPAMAX) 50 MG tablet Take 1 tablet (  50 mg total) by mouth daily. At bedtime 02/01/17   Campbell Riches, NP    Family History Family History  Problem Relation Age of Onset  . Deep vein thrombosis Father   . Hypertension Paternal Grandmother   . Thyroid disease Paternal Grandmother        has had thyroid removed  . Ulcers Mother   . GER disease Mother   . Seizures Daughter   . Hyperthyroidism Daughter   . Other Daughter        meningitis 2 weeks after birth  . Gallbladder disease Neg Hx   . Colon cancer Neg Hx   . Crohn's disease Neg Hx     Social History Social History   Tobacco Use  . Smoking status: Former Smoker    Types: Cigars  . Smokeless tobacco: Never Used  Substance Use Topics  . Alcohol use: No  . Drug use: No     Allergies    Penicillins   Review of Systems Review of Systems  Constitutional: Positive for chills. Negative for appetite change and fever.  HENT: Positive for congestion, rhinorrhea, sore throat and trouble swallowing.   Respiratory: Positive for cough. Negative for chest tightness, shortness of breath and wheezing.   Cardiovascular: Negative for chest pain.  Gastrointestinal: Negative for abdominal pain, nausea and vomiting.  Genitourinary: Negative for dysuria.  Musculoskeletal: Positive for myalgias. Negative for arthralgias.  Skin: Negative for rash.  Neurological: Negative for dizziness, weakness and numbness.  Hematological: Negative for adenopathy.     Physical Exam Updated Vital Signs BP (!) 143/96 (BP Location: Left Arm)   Pulse 91   Temp 98.4 F (36.9 C) (Oral)   Resp 18   Ht 5\' 5"  (1.651 m)   Wt 59 kg   LMP 01/22/2018   SpO2 100%   BMI 21.63 kg/m   Physical Exam  Constitutional: She appears well-developed. No distress.  HENT:  Right Ear: Tympanic membrane and ear canal normal.  Left Ear: Tympanic membrane and ear canal normal.  Mouth/Throat: Uvula is midline and mucous membranes are normal. No oral lesions. Posterior oropharyngeal erythema present. No oropharyngeal exudate, posterior oropharyngeal edema or tonsillar abscesses.  Mild erythema of the oropharynx.  No edema or exudates.  Uvula is midline and nonedematous.  Neck: Normal range of motion. Neck supple.  Cardiovascular: Normal rate, regular rhythm and normal heart sounds.  No murmur heard. Pulmonary/Chest: Effort normal and breath sounds normal. No respiratory distress.  Abdominal: Soft. She exhibits no distension. There is no tenderness.  Musculoskeletal: Normal range of motion.  Lymphadenopathy:    She has no cervical adenopathy.  Neurological: She is alert.  Skin: Skin is warm. Capillary refill takes less than 2 seconds. No rash noted.  Psychiatric: She has a normal mood and affect.  Nursing note and  vitals reviewed.    ED Treatments / Results  Labs (all labs ordered are listed, but only abnormal results are displayed) Labs Reviewed  GROUP A STREP BY PCR    EKG None  Radiology No results found.  Procedures Procedures (including critical care time)  Medications Ordered in ED Medications - No data to display   Initial Impression / Assessment and Plan / ED Course  I have reviewed the triage vital signs and the nursing notes.  Pertinent labs & imaging results that were available during my care of the patient were reviewed by me and considered in my medical decision making (see chart for details).     Patient well-appearing.  Vital signs stable.  Nontoxic.  Airway is patent without edema.  Strep screen negative.  Symptoms likely viral, agrees to symptomatic treatment and PCP follow-up if needed.  Final Clinical Impressions(s) / ED Diagnoses   Final diagnoses:  Pharyngitis, unspecified etiology    ED Discharge Orders    None       Pauline Aus, PA-C 03/24/18 1031    Vanetta Mulders, MD 03/25/18 1609

## 2018-03-28 ENCOUNTER — Encounter: Payer: Self-pay | Admitting: Family Medicine

## 2018-03-28 ENCOUNTER — Ambulatory Visit: Payer: Medicaid Other | Admitting: Family Medicine

## 2018-03-28 VITALS — BP 108/70 | Temp 99.3°F | Ht 66.0 in | Wt 131.0 lb

## 2018-03-28 DIAGNOSIS — J329 Chronic sinusitis, unspecified: Secondary | ICD-10-CM

## 2018-03-28 MED ORDER — HYDROCODONE-HOMATROPINE 5-1.5 MG/5ML PO SYRP: 5.0000 mL | ORAL_SOLUTION | Freq: Every evening | ORAL | 0 refills | Status: DC | PRN

## 2018-03-28 MED ORDER — TIZANIDINE HCL 4 MG PO TABS: 4.0000 mg | ORAL_TABLET | Freq: Three times a day (TID) | ORAL | 5 refills | Status: DC | PRN

## 2018-03-28 MED ORDER — TOPIRAMATE 50 MG PO TABS: 50.0000 mg | ORAL_TABLET | Freq: Every day | ORAL | 5 refills | Status: DC

## 2018-03-28 MED ORDER — CLARITHROMYCIN 500 MG PO TABS: 500.0000 mg | ORAL_TABLET | Freq: Two times a day (BID) | ORAL | 0 refills | Status: DC

## 2018-03-28 MED ORDER — KETOROLAC TROMETHAMINE 10 MG PO TABS: 10.0000 mg | ORAL_TABLET | Freq: Four times a day (QID) | ORAL | 5 refills | Status: DC | PRN

## 2018-03-28 NOTE — Progress Notes (Signed)
   Subjective:    Patient ID: Melanie Gonzales, female    DOB: Jul 02, 1990, 27 y.o.   MRN: [id number]  HPI  Patient is here today to follow up a recent ed visit for cough productive yellow in color and thick,congestion,headache,sinus drainage, sore throat. She states she can not sleep due to the coughing at night.   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 She state she has taken Mucinex and thera flu and neither have helped.Wants refills on toradol and zanaflex and topriamate. Review of Systems No headache, no major weight loss or weight gain, no chest pain no back pain abdominal pain no change in bowel habits complete ROS otherwise negative     Objective:   Physical Exam  Alert, mild malaise. Hydration good Vitals stable. frontal/ maxillary tenderness evident positive nasal congestion. pharynx normal neck supple  lungs clear/no crackles or wheezes. heart regular in rhythm       Assessment & Plan:  Impression rhinosinusitis likely post viral, discussed with patient. plan antibiotics prescribed. Questions answered. Symptomatic care discussed. warning signs discussed. WSL

## 2018-04-11 ENCOUNTER — Telehealth: Payer: Self-pay | Admitting: Family Medicine

## 2018-04-11 MED ORDER — DOXYCYCLINE HYCLATE 100 MG PO TABS: 100.0000 mg | ORAL_TABLET | Freq: Two times a day (BID) | ORAL | 0 refills | Status: DC

## 2018-04-11 NOTE — Telephone Encounter (Signed)
Pt was seen 03/28/18, got better but pt's head cold has returned and it's worse  Stuffy nose, sore throat, HA    No fever, No V & D   Wonders if we can call something in?  Please advise & call pt    [facility]

## 2018-04-11 NOTE — Telephone Encounter (Signed)
Doxy 100 bid ten d 

## 2018-04-11 NOTE — Telephone Encounter (Signed)
Prescription sent electronically to pharmacy. Patient notified. 

## 2018-04-18 ENCOUNTER — Ambulatory Visit: Payer: Medicaid Other | Admitting: Family Medicine

## 2018-04-18 ENCOUNTER — Encounter: Payer: Self-pay | Admitting: Family Medicine

## 2018-04-18 VITALS — BP 128/76 | Temp 98.6°F | Ht 66.0 in | Wt 131.0 lb

## 2018-04-18 DIAGNOSIS — J329 Chronic sinusitis, unspecified: Secondary | ICD-10-CM | POA: Diagnosis not present

## 2018-04-18 DIAGNOSIS — Z23 Encounter for immunization: Secondary | ICD-10-CM

## 2018-04-18 MED ORDER — HYDROCODONE-HOMATROPINE 5-1.5 MG/5ML PO SYRP: ORAL_SOLUTION | ORAL | 0 refills | Status: DC

## 2018-04-18 MED ORDER — CIPROFLOXACIN HCL 500 MG PO TABS: 500.0000 mg | ORAL_TABLET | Freq: Two times a day (BID) | ORAL | 0 refills | Status: DC

## 2018-04-18 NOTE — Progress Notes (Signed)
   Subjective:    Patient ID: Melanie Gonzales, female    DOB: January 30, 1991, 27 y.o.   MRN: [id number]  Sinusitis  This is a new problem. Episode onset: nov 13th. Associated symptoms include congestion, coughing, ear pain, headaches and a sore throat. (Abdominal pain, vomiting, fever) Treatments tried: biaxin, doxy, mucinex, benadryl, ibuprofen, hydrocodone.   Wants to know if she can get flu vaccine today.   Pos substantial  Headache frontal.  Worse with cough.  Sharp at times achy at times.   Review of Systems  HENT: Positive for congestion, ear pain and sore throat.   Respiratory: Positive for cough.   Neurological: Positive for headaches.       Objective:   Physical Exam  Alert, mild malaise. Hydration good Vitals stable. frontal/ maxillary tenderness evident positive nasal congestion. pharynx normal neck supple  lungs clear/no crackles or wheezes. heart regular in rhythm       Assessment & Plan:  Impression rhinosinusitis likely post viral, discussed with patient. plan antibiotics prescribed. Questions answered. Symptomatic care discussed. warning signs discussed. WSL

## 2018-05-28 ENCOUNTER — Encounter: Payer: Self-pay | Admitting: Adult Health

## 2018-05-28 ENCOUNTER — Other Ambulatory Visit: Payer: Self-pay

## 2018-05-28 ENCOUNTER — Ambulatory Visit (INDEPENDENT_AMBULATORY_CARE_PROVIDER_SITE_OTHER): Payer: Medicaid Other | Admitting: Adult Health

## 2018-05-28 VITALS — BP 132/90 | HR 76 | Ht 66.0 in | Wt 131.0 lb

## 2018-05-28 DIAGNOSIS — R030 Elevated blood-pressure reading, without diagnosis of hypertension: Secondary | ICD-10-CM | POA: Diagnosis not present

## 2018-05-28 DIAGNOSIS — Z3041 Encounter for surveillance of contraceptive pills: Secondary | ICD-10-CM | POA: Insufficient documentation

## 2018-05-28 MED ORDER — HYDROCHLOROTHIAZIDE 12.5 MG PO CAPS: 12.5000 mg | ORAL_CAPSULE | Freq: Every day | ORAL | 3 refills | Status: DC

## 2018-05-28 NOTE — Patient Instructions (Signed)
DASH Eating Plan  DASH stands for "Dietary Approaches to Stop Hypertension." The DASH eating plan is a healthy eating plan that has been shown to reduce high blood pressure (hypertension). It may also reduce your risk for type 2 diabetes, heart disease, and stroke. The DASH eating plan may also help with weight loss.  What are tips for following this plan?    General guidelines   Avoid eating more than 2,300 mg (milligrams) of salt (sodium) a day. If you have hypertension, you may need to reduce your sodium intake to 1,500 mg a day.   Limit alcohol intake to no more than 1 drink a day for nonpregnant women and 2 drinks a day for men. One drink equals 12 oz of beer, 5 oz of wine, or 1 oz of hard liquor.   Work with your health care provider to maintain a healthy body weight or to lose weight. Ask what an ideal weight is for you.   Get at least 30 minutes of exercise that causes your heart to beat faster (aerobic exercise) most days of the week. Activities may include walking, swimming, or biking.   Work with your health care provider or diet and nutrition specialist (dietitian) to adjust your eating plan to your individual calorie needs.  Reading food labels     Check food labels for the amount of sodium per serving. Choose foods with less than 5 percent of the Daily Value of sodium. Generally, foods with less than 300 mg of sodium per serving fit into this eating plan.   To find whole grains, look for the word "whole" as the first word in the ingredient list.  Shopping   Buy products labeled as "low-sodium" or "no salt added."   Buy fresh foods. Avoid canned foods and premade or frozen meals.  Cooking   Avoid adding salt when cooking. Use salt-free seasonings or herbs instead of table salt or sea salt. Check with your health care provider or pharmacist before using salt substitutes.   Do not fry foods. Cook foods using healthy methods such as baking, boiling, grilling, and broiling instead.   Cook with  heart-healthy oils, such as olive, canola, soybean, or sunflower oil.  Meal planning   Eat a balanced diet that includes:  ? 5 or more servings of fruits and vegetables each day. At each meal, try to fill half of your plate with fruits and vegetables.  ? Up to 6-8 servings of whole grains each day.  ? Less than 6 oz of lean meat, poultry, or fish each day. A 3-oz serving of meat is about the same size as a deck of cards. One egg equals 1 oz.  ? 2 servings of low-fat dairy each day.  ? A serving of nuts, seeds, or beans 5 times each week.  ? Heart-healthy fats. Healthy fats called Omega-3 fatty acids are found in foods such as flaxseeds and coldwater fish, like sardines, salmon, and mackerel.   Limit how much you eat of the following:  ? Canned or prepackaged foods.  ? Food that is high in trans fat, such as fried foods.  ? Food that is high in saturated fat, such as fatty meat.  ? Sweets, desserts, sugary drinks, and other foods with added sugar.  ? Full-fat dairy products.   Do not salt foods before eating.   Try to eat at least 2 vegetarian meals each week.   Eat more home-cooked food and less restaurant, buffet, and fast food.     When eating at a restaurant, ask that your food be prepared with less salt or no salt, if possible.  What foods are recommended?  The items listed may not be a complete list. Talk with your dietitian about what dietary choices are best for you.  Grains  Whole-grain or whole-wheat bread. Whole-grain or whole-wheat pasta. Brown rice. Oatmeal. Quinoa. Bulgur. Whole-grain and low-sodium cereals. Pita bread. Low-fat, low-sodium crackers. Whole-wheat flour tortillas.  Vegetables  Fresh or frozen vegetables (raw, steamed, roasted, or grilled). Low-sodium or reduced-sodium tomato and vegetable juice. Low-sodium or reduced-sodium tomato sauce and tomato paste. Low-sodium or reduced-sodium canned vegetables.  Fruits  All fresh, dried, or frozen fruit. Canned fruit in natural juice (without  added sugar).  Meat and other protein foods  Skinless chicken or turkey. Ground chicken or turkey. Pork with fat trimmed off. Fish and seafood. Egg whites. Dried beans, peas, or lentils. Unsalted nuts, nut butters, and seeds. Unsalted canned beans. Lean cuts of beef with fat trimmed off. Low-sodium, lean deli meat.  Dairy  Low-fat (1%) or fat-free (skim) milk. Fat-free, low-fat, or reduced-fat cheeses. Nonfat, low-sodium ricotta or cottage cheese. Low-fat or nonfat yogurt. Low-fat, low-sodium cheese.  Fats and oils  Soft margarine without trans fats. Vegetable oil. Low-fat, reduced-fat, or light mayonnaise and salad dressings (reduced-sodium). Canola, safflower, olive, soybean, and sunflower oils. Avocado.  Seasoning and other foods  Herbs. Spices. Seasoning mixes without salt. Unsalted popcorn and pretzels. Fat-free sweets.  What foods are not recommended?  The items listed may not be a complete list. Talk with your dietitian about what dietary choices are best for you.  Grains  Baked goods made with fat, such as croissants, muffins, or some breads. Dry pasta or rice meal packs.  Vegetables  Creamed or fried vegetables. Vegetables in a cheese sauce. Regular canned vegetables (not low-sodium or reduced-sodium). Regular canned tomato sauce and paste (not low-sodium or reduced-sodium). Regular tomato and vegetable juice (not low-sodium or reduced-sodium). Pickles. Olives.  Fruits  Canned fruit in a light or heavy syrup. Fried fruit. Fruit in cream or butter sauce.  Meat and other protein foods  Fatty cuts of meat. Ribs. Fried meat. Bacon. Sausage. Bologna and other processed lunch meats. Salami. Fatback. Hotdogs. Bratwurst. Salted nuts and seeds. Canned beans with added salt. Canned or smoked fish. Whole eggs or egg yolks. Chicken or turkey with skin.  Dairy  Whole or 2% milk, cream, and half-and-half. Whole or full-fat cream cheese. Whole-fat or sweetened yogurt. Full-fat cheese. Nondairy creamers. Whipped toppings.  Processed cheese and cheese spreads.  Fats and oils  Butter. Stick margarine. Lard. Shortening. Ghee. Bacon fat. Tropical oils, such as coconut, palm kernel, or palm oil.  Seasoning and other foods  Salted popcorn and pretzels. Onion salt, garlic salt, seasoned salt, table salt, and sea salt. Worcestershire sauce. Tartar sauce. Barbecue sauce. Teriyaki sauce. Soy sauce, including reduced-sodium. Steak sauce. Canned and packaged gravies. Fish sauce. Oyster sauce. Cocktail sauce. Horseradish that you find on the shelf. Ketchup. Mustard. Meat flavorings and tenderizers. Bouillon cubes. Hot sauce and Tabasco sauce. Premade or packaged marinades. Premade or packaged taco seasonings. Relishes. Regular salad dressings.  Where to find more information:   National Heart, Lung, and Blood Institute: www.nhlbi.nih.gov   American Heart Association: www.heart.org  Summary   The DASH eating plan is a healthy eating plan that has been shown to reduce high blood pressure (hypertension). It may also reduce your risk for type 2 diabetes, heart disease, and stroke.   With the   DASH eating plan, you should limit salt (sodium) intake to 2,300 mg a day. If you have hypertension, you may need to reduce your sodium intake to 1,500 mg a day.   When on the DASH eating plan, aim to eat more fresh fruits and vegetables, whole grains, lean proteins, low-fat dairy, and heart-healthy fats.   Work with your health care provider or diet and nutrition specialist (dietitian) to adjust your eating plan to your individual calorie needs.  This information is not intended to replace advice given to you by your health care provider. Make sure you discuss any questions you have with your health care provider.  Document Released: 05/05/2011 Document Revised: 05/09/2016 Document Reviewed: 05/09/2016  Elsevier Interactive Patient Education  2019 Elsevier Inc.

## 2018-05-28 NOTE — Progress Notes (Addendum)
Patient ID: Melanie Gonzales, female   DOB: [birth year], 27 y.o.   MRN: [id number] History of Present Illness:  Melanie Gonzales is a 27 year old black female, back in follow up on taking lo loestrin and she fells better off the depo and periods last about 3 days and no hair loss but has cramps for about a week.  Current Medications, Allergies, Past Medical History, Past Surgical History, Family History and Social History were reviewed in [facility] electronic medical record.     Review of Systems: +cramps with period Periods last about 3 days No hair loss Taking buspar just once a day and doing well since out of school for now   Physical Exam:BP 132/90 (BP Location: Left Arm, Cuff Size: Normal)   Pulse 76   Ht 5\' 6"  (1.676 m)   Wt 131 lb (59.4 kg)   LMP 05/21/2018   BMI 21.14 kg/m BP was 145/88 on arrival and was elevated on recent ER visit. She says hypertension is bad on her dad's side of the family. General:  Well developed, well nourished, no acute distress Skin:  Warm and dry Lungs; Clear to auscultation bilaterally Cardiovascular: Regular rate and rhythm Psych:  No mood changes, alert and cooperative,seems happy Fall risk is low. She want to continue lo loestrin.  Will add microzide and try DASH diet.  Impression:  1. Encounter for surveillance of contraceptive pills   2. Elevated BP without diagnosis of hypertension      Plan: Continue lo loestrin Meds ordered this encounter  Medications  . hydrochlorothiazide (MICROZIDE) 12.5 MG capsule    Sig: Take 1 capsule (12.5 mg total) by mouth daily.    Dispense:  30 capsule    Refill:  3    Order Specific Question:   Supervising Provider    Answer:   Duane LopeEURE, LUTHER H [[id number]  Review handout on DASH diet F/U in 6 weeks for BP check

## 2018-06-08 ENCOUNTER — Encounter: Payer: Self-pay | Admitting: Family Medicine

## 2018-06-08 ENCOUNTER — Ambulatory Visit: Payer: Medicaid Other | Admitting: Family Medicine

## 2018-06-08 VITALS — BP 138/90 | Temp 99.0°F | Wt 124.4 lb

## 2018-06-08 DIAGNOSIS — R000 Tachycardia, unspecified: Secondary | ICD-10-CM | POA: Diagnosis not present

## 2018-06-08 DIAGNOSIS — R42 Dizziness and giddiness: Secondary | ICD-10-CM | POA: Diagnosis not present

## 2018-06-08 DIAGNOSIS — I10 Essential (primary) hypertension: Secondary | ICD-10-CM

## 2018-06-08 DIAGNOSIS — R5383 Other fatigue: Secondary | ICD-10-CM | POA: Diagnosis not present

## 2018-06-08 NOTE — Progress Notes (Signed)
Subjective:    Patient ID: Melanie Gonzales, female    DOB: 1991/04/06, 28 y.o.   MRN: [id number] Patient arrives office with numerous concerns Dizziness  This is a new problem. The current episode started in the past 7 days. Associated symptoms include fatigue. Associated symptoms comments: Rapid heart beat, numbness/soreness in left leg. .   Started new BP med (HCTZ 12.5 mg )  on May 28 2018 by Derrek Monaco at [facility]. Pt states she has also lost weight. Pt states when she gets dizzy she has to st   Pt states she has been taking BP at home and has had varied readings such as 120s/80s  To 140/90s,   Pt noting dizziness at times, random, comes out of the blue, a randsom wave Lasts few minute  Hit unsteady , last for a few monents, gets a bit warm momentrily , heart rate icreases with the dizziness   Overall anxiety is better, still feels some , but better, stress level has calmed down. 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  Patient does admit she is out of shape.  She has not been exercising as much as usual.    Pain and numbness at times.  Left leg.  Describes it primarily on the upper anterior mid shin region.  Patient also concerned about her weight loss.  Has lost 7 pounds in the last few weeks.  Patient is also been diagnosed with hypertensive by her OB/GYN.  They have started her on a diuretic.  No recommendation for follow-up potassium or renal function assessment  T time heart rate shoots up, Review of Systems  Constitutional: Positive for fatigue.  Neurological: Positive for dizziness.       Objective:   Physical Exam  Alert and oriented, vitals reviewed and stable, NAD ENT-TM's and ext canals WNL bilat via otoscopic exam Soft palate, tonsils and post pharynx WNL via oropharyngeal  exam Neck-symmetric, no masses; thyroid nonpalpable and nontender Pulmonary-no tachypnea or accessory muscle use; Clear without wheezes via auscultation Card--no abnrml murmurs, rhythm reg and rate WNL Carotid pulses symmetric, without bruits Neurological exam within normal limits no cerebellar findings.  Finger-to-nose within normal limits.  Fine coordination present strength reflexes all intact diffusely   EKG normal sinus rhythm no significant ST-T changes.      Assessment & Plan:  Impression intermittent tachycardia.  Etiology unclear.  Not present today.  Occurs during periods of anxiety.  2.  Dizziness nonspecific.  No warning signs which should lead to full neurology work-up at this time.  Occurs sporadically.  3.  Weight loss concerns and patient not enough to warrant major work-up at this time.  4.  Hypertension diagnosis per OB/GYN review of blood pressures over the last year does show the numbers.  Strong family history.  Will check met 7 a potassium rationale discussed  5.  Anxiety.  Patient certainly living in stressful life and stays with a severely compromised daughter and full-time work expectations.  Discussed.  Patient feels not a big component but I am not sure  Appropriate blood work  Follow-up in 6 weeks  Exercise encouraged  No change in meds.25 Greater than 50% of this 40 minute face to face visit was spent in counseling and discussion and coordination of care regarding the  above diagnosis/diagnosies

## 2018-06-11 ENCOUNTER — Encounter: Payer: Self-pay | Admitting: Family Medicine

## 2018-06-12 DIAGNOSIS — R5383 Other fatigue: Secondary | ICD-10-CM | POA: Diagnosis not present

## 2018-06-12 DIAGNOSIS — R42 Dizziness and giddiness: Secondary | ICD-10-CM | POA: Diagnosis not present

## 2018-06-13 LAB — TSH: TSH: 1.03 u[IU]/mL (ref 0.450–4.500)

## 2018-06-13 LAB — CBC WITH DIFFERENTIAL/PLATELET
BASOS: 1 %
Basophils Absolute: 0.1 10*3/uL (ref 0.0–0.2)
EOS (ABSOLUTE): 0.1 10*3/uL (ref 0.0–0.4)
EOS: 2 %
HEMATOCRIT: 39.8 % (ref 34.0–46.6)
HEMOGLOBIN: 13.4 g/dL (ref 11.1–15.9)
Immature Grans (Abs): 0 10*3/uL (ref 0.0–0.1)
Immature Granulocytes: 0 %
LYMPHS ABS: 2.3 10*3/uL (ref 0.7–3.1)
Lymphs: 64 %
MCH: 30.4 pg (ref 26.6–33.0)
MCHC: 33.7 g/dL (ref 31.5–35.7)
MCV: 90 fL (ref 79–97)
Monocytes Absolute: 0.4 10*3/uL (ref 0.1–0.9)
Monocytes: 10 %
NEUTROS PCT: 23 %
Neutrophils Absolute: 0.8 10*3/uL — ABNORMAL LOW (ref 1.4–7.0)
Platelets: 350 10*3/uL (ref 150–450)
RBC: 4.41 x10E6/uL (ref 3.77–5.28)
RDW: 12 % (ref 11.7–15.4)
WBC: 3.6 10*3/uL (ref 3.4–10.8)

## 2018-06-13 LAB — LIPID PANEL
CHOL/HDL RATIO: 4.7 ratio — AB (ref 0.0–4.4)
Cholesterol, Total: 216 mg/dL — ABNORMAL HIGH (ref 100–199)
HDL: 46 mg/dL (ref >39)
LDL Calculated: 148 mg/dL — ABNORMAL HIGH (ref 0–99)
TRIGLYCERIDES: 109 mg/dL (ref 0–149)
VLDL Cholesterol Cal: 22 mg/dL (ref 5–40)

## 2018-06-13 LAB — HEPATIC FUNCTION PANEL
ALK PHOS: 63 IU/L (ref 39–117)
ALT: 12 IU/L (ref 0–32)
AST: 16 IU/L (ref 0–40)
Albumin: 4.4 g/dL (ref 3.5–5.5)
Bilirubin Total: 0.4 mg/dL (ref 0.0–1.2)
Bilirubin, Direct: 0.09 mg/dL (ref 0.00–0.40)
TOTAL PROTEIN: 7.7 g/dL (ref 6.0–8.5)

## 2018-06-13 LAB — BASIC METABOLIC PANEL
BUN/Creatinine Ratio: 9 (ref 9–23)
BUN: 8 mg/dL (ref 6–20)
CALCIUM: 9.5 mg/dL (ref 8.7–10.2)
CO2: 24 mmol/L (ref 20–29)
Chloride: 99 mmol/L (ref 96–106)
Creatinine, Ser: 0.87 mg/dL (ref 0.57–1.00)
GFR, EST AFRICAN AMERICAN: 106 mL/min/{1.73_m2} (ref >59)
GFR, EST NON AFRICAN AMERICAN: 92 mL/min/{1.73_m2} (ref >59)
Glucose: 88 mg/dL (ref 65–99)
Potassium: 3.3 mmol/L — ABNORMAL LOW (ref 3.5–5.2)
Sodium: 139 mmol/L (ref 134–144)

## 2018-06-13 MED ORDER — POTASSIUM CHLORIDE CRYS ER 20 MEQ PO TBCR: 20.0000 meq | EXTENDED_RELEASE_TABLET | Freq: Every day | ORAL | 6 refills | Status: DC

## 2018-06-13 NOTE — Addendum Note (Signed)
Addended by: Meredith Leeds on: 06/13/2018 03:32 PM   Modules accepted: Orders

## 2018-07-12 ENCOUNTER — Encounter: Payer: Self-pay | Admitting: Adult Health

## 2018-07-12 ENCOUNTER — Ambulatory Visit: Payer: Medicaid Other | Admitting: Adult Health

## 2018-07-12 VITALS — BP 129/85 | HR 79 | Ht 66.0 in | Wt 125.5 lb

## 2018-07-12 DIAGNOSIS — I10 Essential (primary) hypertension: Secondary | ICD-10-CM | POA: Diagnosis not present

## 2018-07-12 MED ORDER — AMLODIPINE BESYLATE 2.5 MG PO TABS: 2.5000 mg | ORAL_TABLET | Freq: Every day | ORAL | 3 refills | Status: DC

## 2018-07-12 NOTE — Progress Notes (Signed)
Patient ID: Melanie Gonzales, female   DOB: 1990/08/26, 28 y.o.   MRN: [id number] History of Present Illness: Melanie Gonzales is a 28 year old black female in for BP check. PCP is Melanie Gonzales.    Current Medications, Allergies, Past Medical History, Past Surgical History, Family History and Social History were reviewed in [name] record.     Review of Systems: Feels better now,had episode of elevated heart rate and felt dizzy and saw Dr Melanie Gonzales and K+ was low, started K Dur     Physical Exam:BP 129/85 (BP Location: Right Arm, Patient Position: Sitting, Cuff Size: Normal)   Pulse 79   Ht 5\' 6"  (1.676 m)   Wt 125 lb 8 oz (56.9 kg)   BMI 20.26 kg/m  General:  Well developed, well nourished, no acute distress Skin:  Warm and dry Lungs; Clear to auscultation bilaterally Cardiovascular: Regular rate and rhythm  Psych:  No mood changes, alert and cooperative,seems happy   Impression: 1. Essential hypertension       Plan: Will start Norvasc 2.5 mg and stop Microzide in about 3-4 days after starting Norvasc Meds ordered this encounter  Medications  . amLODipine (NORVASC) 2.5 MG tablet    Sig: Take 1 tablet (2.5 mg total) by mouth daily.    Dispense:  30 tablet    Refill:  3    Order Specific Question:   Supervising Provider    Answer:   Melanie Gonzales [[id number]]  F/U in 6 weeks with me and PCP as scheduled

## 2018-07-20 NOTE — Progress Notes (Signed)
REVIEWED-NO ADDITIONAL RECOMMENDATIONS. 

## 2018-07-30 ENCOUNTER — Encounter: Payer: Self-pay | Admitting: Family Medicine

## 2018-07-31 ENCOUNTER — Ambulatory Visit: Payer: Medicaid Other | Admitting: Family Medicine

## 2018-07-31 ENCOUNTER — Encounter: Payer: Self-pay | Admitting: Family Medicine

## 2018-07-31 VITALS — Temp 98.3°F | Wt 125.2 lb

## 2018-07-31 DIAGNOSIS — J111 Influenza due to unidentified influenza virus with other respiratory manifestations: Secondary | ICD-10-CM

## 2018-07-31 MED ORDER — HYDROCODONE-HOMATROPINE 5-1.5 MG/5ML PO SYRP: ORAL_SOLUTION | ORAL | 0 refills | Status: DC

## 2018-07-31 NOTE — Progress Notes (Signed)
   Subjective:    Patient ID: Melanie Gonzales, female    DOB: 12-16-1990, 28 y.o.   MRN: [id number]  Cough  This is a new problem. The current episode started in the past 7 days. The cough is productive of sputum (yellowish-greem). Associated symptoms include headaches, a sore throat and sweats. Associated symptoms comments: Sneezing, chest tightness, aches. Treatments tried: Mucinex, theraflu. The treatment provided no relief.   Pt daughter is being treated for pseudomonas currently   Sat eve  Got a flu shot sore throat   Sluggish tired   Quite a bit of coughing   Trouble cathin gyur breath    soe headache  Chest and back somewhat achey    No fever   Appetite  Pos prod yellowish green      Review of Systems  HENT: Positive for sore throat.   Respiratory: Positive for cough.   Neurological: Positive for headaches.       Objective:   Physical Exam  Alert vitals reviewed, moderate malaise. Hydration good. Positive nasal congestion lungs no crackles or wheezes, no tachypnea, intermittent bronchial cough during exam heart regular rate and rhythm.       Assessment & Plan:  Impression influenza discussed at length. Ashby Dawes of illness and potential sequela discussed. Plan Tamiflu prescribed if indicated and timing appropriate. Symptom care discussed. Warning signs discussed. WSL Of note too late to use Tamiflu discussed.  Will prescribe Hycodan

## 2018-08-23 ENCOUNTER — Ambulatory Visit: Payer: Self-pay | Admitting: Adult Health

## 2018-09-12 ENCOUNTER — Other Ambulatory Visit: Payer: Self-pay

## 2018-09-12 ENCOUNTER — Ambulatory Visit (INDEPENDENT_AMBULATORY_CARE_PROVIDER_SITE_OTHER): Payer: Medicaid Other | Admitting: Adult Health

## 2018-09-12 ENCOUNTER — Encounter: Payer: Self-pay | Admitting: Adult Health

## 2018-09-12 VITALS — BP 113/71 | HR 100 | Ht 66.0 in | Wt 125.0 lb

## 2018-09-12 DIAGNOSIS — Z8639 Personal history of other endocrine, nutritional and metabolic disease: Secondary | ICD-10-CM

## 2018-09-12 DIAGNOSIS — I10 Essential (primary) hypertension: Secondary | ICD-10-CM | POA: Diagnosis not present

## 2018-09-12 MED ORDER — AMLODIPINE BESYLATE 2.5 MG PO TABS: 2.5000 mg | ORAL_TABLET | Freq: Every day | ORAL | 3 refills | Status: DC

## 2018-09-12 NOTE — Progress Notes (Signed)
Patient ID: Melanie Gonzales, female   DOB: [birth year], 28 y.o.   MRN: [id number] History of Present Illness: Melanie Gonzales is a 28 year old black female, G1P1 in for a BP check after starting norvasc 2.5 mg in February. PCP is [name].    Current Medications, Allergies, Past Medical History, Past Surgical History, Family History and Social History were reviewed in [name] record.     Review of Systems: Feels good, denies any headaches, cough, chest pain, or shortness of breath or swelling Does get leg cramps and is on K+     Physical Exam:BP 113/71 (BP Location: Left Arm, Patient Position: Sitting, Cuff Size: Normal)   Pulse 100   Ht 5\' 6"  (1.676 m)   Wt 125 lb (56.7 kg)   BMI 20.18 kg/m  General:  Well developed, well nourished, no acute distress Skin:  Warm and dry Lungs; Clear to auscultation bilaterally Cardiovascular: Regular rate and rhythm  Extremities; no swelling Psych:  No mood changes, alert and cooperative,seems happy Fall risk is low PHQ 2 score 0.    Impression: 1. Essential hypertension - Comprehensive metabolic panel Meds ordered this encounter  Medications  . amLODipine (NORVASC) 2.5 MG tablet    Sig: Take 1 tablet (2.5 mg total) by mouth daily.    Dispense:  90 tablet    Refill:  3    Order Specific Question:   Supervising Provider    Answer:   Despina Hidden, LUTHER H [[id number]]    2. History of low potassium - Comprehensive metabolic panel    Plan: Check CMP today, will talk when results back  Continue Norvasc F/U in 6 months for BP check

## 2018-09-13 LAB — COMPREHENSIVE METABOLIC PANEL
ALT: 17 IU/L (ref 0–32)
AST: 19 IU/L (ref 0–40)
Albumin/Globulin Ratio: 1.2 (ref 1.2–2.2)
Albumin: 4.2 g/dL (ref 3.9–5.0)
Alkaline Phosphatase: 68 IU/L (ref 39–117)
BUN/Creatinine Ratio: 16 (ref 9–23)
BUN: 14 mg/dL (ref 6–20)
Bilirubin Total: 0.4 mg/dL (ref 0.0–1.2)
CO2: 22 mmol/L (ref 20–29)
Calcium: 9.7 mg/dL (ref 8.7–10.2)
Chloride: 101 mmol/L (ref 96–106)
Creatinine, Ser: 0.89 mg/dL (ref 0.57–1.00)
GFR calc Af Amer: 103 mL/min/{1.73_m2} (ref >59)
GFR calc non Af Amer: 90 mL/min/{1.73_m2} (ref >59)
Globulin, Total: 3.6 g/dL (ref 1.5–4.5)
Glucose: 109 mg/dL — ABNORMAL HIGH (ref 65–99)
Potassium: 3.9 mmol/L (ref 3.5–5.2)
Sodium: 139 mmol/L (ref 134–144)
Total Protein: 7.8 g/dL (ref 6.0–8.5)

## 2018-09-14 ENCOUNTER — Other Ambulatory Visit: Payer: Self-pay | Admitting: Adult Health

## 2018-09-14 MED ORDER — FLUCONAZOLE 150 MG PO TABS: ORAL_TABLET | ORAL | 1 refills | Status: DC

## 2018-09-14 NOTE — Progress Notes (Signed)
rx diflucan for yeast

## 2018-11-19 ENCOUNTER — Other Ambulatory Visit: Payer: Self-pay | Admitting: Adult Health

## 2018-12-26 ENCOUNTER — Other Ambulatory Visit: Payer: Medicaid Other

## 2018-12-26 ENCOUNTER — Other Ambulatory Visit: Payer: Self-pay

## 2018-12-26 DIAGNOSIS — Z20822 Contact with and (suspected) exposure to covid-19: Secondary | ICD-10-CM

## 2018-12-26 DIAGNOSIS — R6889 Other general symptoms and signs: Secondary | ICD-10-CM | POA: Diagnosis not present

## 2018-12-28 LAB — NOVEL CORONAVIRUS, NAA: SARS-CoV-2, NAA: NOT DETECTED

## 2019-01-07 ENCOUNTER — Encounter: Payer: Self-pay | Admitting: Family Medicine

## 2019-01-07 ENCOUNTER — Other Ambulatory Visit: Payer: Self-pay

## 2019-01-07 ENCOUNTER — Ambulatory Visit (INDEPENDENT_AMBULATORY_CARE_PROVIDER_SITE_OTHER): Payer: Medicaid Other | Admitting: Family Medicine

## 2019-01-07 DIAGNOSIS — R42 Dizziness and giddiness: Secondary | ICD-10-CM

## 2019-01-07 DIAGNOSIS — R000 Tachycardia, unspecified: Secondary | ICD-10-CM | POA: Diagnosis not present

## 2019-01-07 NOTE — Addendum Note (Signed)
Addended by: Carmelina Noun on: 01/07/2019 02:02 PM   Modules accepted: Orders

## 2019-01-07 NOTE — Progress Notes (Signed)
   Subjective:    Patient ID: Melanie Gonzales, female    DOB: February 29, 1991, 28 y.o.   MRN: [id number] Audio plus video HPI pt took bp about one hour before call and it was 125/91. Pulse 96.  dizziness, elevated heart rate. Started 2 days ago. No fever, has had moments where legs tingle. Pt states she has noticed this happens when she is standing.   Virtual Visit via Video Note  I connected with Melanie Gonzales on 01/07/19 at 11:00 AM EDT by a video enabled telemedicine application and verified that I am speaking with the correct person using two identifiers.  Location: Patient: home Provider: office   I discussed the limitations of evaluation and management by telemedicine and the availability of in person appointments. The patient expressed understanding and agreed to proceed.  History of Present Illness:    Observations/Objective:   Assessment and Plan:   Follow Up Instructions:    I discussed the assessment and treatment plan with the patient. The patient was provided an opportunity to ask questions and all were answered. The patient agreed with the plan and demonstrated an understanding of the instructions.   The patient was advised to call back or seek an in-person evaluation if the symptoms worsen or if the condition fails to improve as anticipated.  I provided 25 minutes of non-face-to-face time during this encounter.   Patient presents with ongoing concerns.  Many aspects very similar to 6 months ago.  Very concerned about her rapid heart rate.  At x110 or 120.  Usually occurs when at work.  Sometimes associated with shortness of breath sometimes associated with tingling in the legs.  Patient on Norvasc for hypertension  Patient underlines which she is very concerned something serious is going on.  No loss of consciousness.  Patient claims no excess anxiety.  We did pretty thorough blood work this winter looking for causes of fatigue no clear etiology  Does  have history of anxiety has been prescribed BuSpar by her GYN  Patient also very concerned about her dizziness.  Unsteady at times.  Worried that it could be something very serious.  Affecting her ability to go to work at times   Review of Systems No headache, no major weight loss or weight gain, no chest pain no back pain abdominal pain no change in bowel habits complete ROS otherwise negative     Objective:   Physical Exam  Virtual      Assessment & Plan:  Impression 1 tachyarrhythmias with high concern regarding her heart.  I think likely not serious from a cardiac standpoint.  Patient adamant about potential for heart troubles.  We will do referral  2.  Dizziness.  Unsteadiness.  Very high concern about some underlying neurological process.  Discussed once again.  We will do referral  3.  Anxiety.  Could explain patient's symptoms

## 2019-01-09 ENCOUNTER — Encounter: Payer: Self-pay | Admitting: Family Medicine

## 2019-01-09 ENCOUNTER — Telehealth: Payer: Self-pay | Admitting: Family Medicine

## 2019-01-09 NOTE — Telephone Encounter (Signed)
Please advise. Thank you

## 2019-01-09 NOTE — Telephone Encounter (Signed)
Sure , wis

## 2019-01-09 NOTE — Telephone Encounter (Signed)
Work note is up front for pickup 

## 2019-01-09 NOTE — Telephone Encounter (Signed)
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

## 2019-01-09 NOTE — Telephone Encounter (Signed)
Please give work note, thank you

## 2019-01-10 ENCOUNTER — Other Ambulatory Visit: Payer: Self-pay

## 2019-01-10 ENCOUNTER — Ambulatory Visit (INDEPENDENT_AMBULATORY_CARE_PROVIDER_SITE_OTHER): Payer: Medicaid Other | Admitting: Cardiovascular Disease

## 2019-01-10 ENCOUNTER — Encounter (INDEPENDENT_AMBULATORY_CARE_PROVIDER_SITE_OTHER): Payer: Medicaid Other

## 2019-01-10 ENCOUNTER — Encounter: Payer: Self-pay | Admitting: Cardiovascular Disease

## 2019-01-10 ENCOUNTER — Ambulatory Visit (HOSPITAL_COMMUNITY)
Admission: RE | Admit: 2019-01-10 | Discharge: 2019-01-10 | Disposition: A | Discharge status: Home / Self Care | Payer: Medicaid Other | Source: Ambulatory Visit | Attending: Cardiovascular Disease | Admitting: Cardiovascular Disease

## 2019-01-10 VITALS — BP 125/87 | HR 83 | Temp 97.5°F | Ht 66.0 in | Wt 126.0 lb

## 2019-01-10 DIAGNOSIS — R002 Palpitations: Secondary | ICD-10-CM | POA: Insufficient documentation

## 2019-01-10 DIAGNOSIS — I10 Essential (primary) hypertension: Secondary | ICD-10-CM | POA: Diagnosis not present

## 2019-01-10 DIAGNOSIS — F419 Anxiety disorder, unspecified: Secondary | ICD-10-CM

## 2019-01-10 LAB — ECHOCARDIOGRAM COMPLETE
Height: 66 in
Weight: 2016 oz

## 2019-01-10 NOTE — Patient Instructions (Signed)
Medication Instructions: Your physician recommends that you continue on your current medications as directed. Please refer to the Current Medication list given to you today.   Labwork: None  Procedures/Testing: Your physician has requested that you have an echocardiogram TODAY. Echocardiography is a painless test that uses sound waves to create images of your heart. It provides your doctor with information about the size and shape of your heart and how well your heart's chambers and valves are working. This procedure takes approximately one hour. There are no restrictions for this procedure.   Your physician has recommended that you wear an event monitor. Event monitors are medical devices that record the heart's electrical activity. Doctors most often [place] these monitors to diagnose arrhythmias. Arrhythmias are problems with the speed or rhythm of the heartbeat. The monitor is a small, portable device. You can wear one while you do your normal daily activities. This is usually used to diagnose what is causing palpitations/syncope (passing out).   Follow-Up: 3 months with Dr.Koneswaran  Any Additional Special Instructions Will Be Listed Below (If Applicable).     If you need a refill on your cardiac medications before your next appointment, please call your pharmacy.

## 2019-01-10 NOTE — Progress Notes (Signed)
*  PRELIMINARY RESULTS* Echocardiogram 2D Echocardiogram has been performed.  Melanie Gonzales 01/10/2019, 12:39 PM

## 2019-01-10 NOTE — Progress Notes (Signed)
CARDIOLOGY CONSULT NOTE  Patient ID: Melanie Gonzales MRN: [id number] DOB/AGE: 07-12-1990 28 y.o.  Admit date: (Not on file) Primary Physician: Mikey Kirschner, MD  Reason for Consultation: Palpitations  HPI: Melanie Gonzales is a 28 y.o. female who is being seen today for the evaluation of palpitations at the request of Mikey Kirschner, MD.   This past Saturday at about 3 AM, she got up to check on her disabled 89-year-old daughter.  When she got out of bed she felt lightheaded and dizzy and felt like her heart was racing.  Since then she has felt it sporadically racing more frequently.  On 2 occasions she felt a hard pounding sensation.  She has some mild shortness of breath associated with elevated heart rates.  She has checked her heart rate at home and it is gone as high as 125 bpm.  She denies exertional chest pain and dyspnea.  She denies leg swelling, orthopnea, PND, and syncope.  She describes associated tingling and numbness in her legs.  She drinks Gatorade, water, and very seldom has an energy drink.  She takes BuSpar as needed for anxiety and takes it once a month at the most.  She does not feel anxious at present.  I personally reviewed the ECG performed on 06/11/2018 which demonstrated sinus rhythm with baseline artifact.  Comprehensive metabolic panel from April 2020 was unremarkable as were her CBC and TSH from January 2020.   Allergies  Allergen Reactions  . 5-Alpha Reductase Inhibitors   . Penicillins Hives    Current Outpatient Medications  Medication Sig Dispense Refill  . amLODipine (NORVASC) 2.5 MG tablet Take 1 tablet (2.5 mg total) by mouth daily. 90 tablet 3  . busPIRone (BUSPAR) 5 MG tablet Take 1 tablet (5 mg total) by mouth 3 (three) times daily. (Patient taking differently: Take 5 mg by mouth as needed. ) 90 tablet 1  . ketorolac (TORADOL) 10 MG tablet Take 1 tablet (10 mg total) by mouth every 6 (six) hours as needed. For migraine. Use  sparingly 20 tablet 5  . Norethindrone-Ethinyl Estradiol-Fe Biphas (LO LOESTRIN FE) 1 MG-10 MCG / 10 MCG tablet Take 1 tablet by mouth daily. Take 1 daily by mouth 1 Package 11  . tiZANidine (ZANAFLEX) 4 MG tablet Take 1 tablet (4 mg total) by mouth every 8 (eight) hours as needed for muscle spasms. 30 tablet 5  . topiramate (TOPAMAX) 50 MG tablet Take 1 tablet (50 mg total) by mouth daily. At bedtime 30 tablet 5  . potassium chloride SA (K-DUR,KLOR-CON) 20 MEQ tablet Take 1 tablet (20 mEq total) by mouth daily. (Patient not taking: Reported on 01/07/2019) 30 tablet 6   No current facility-administered medications for this visit.     Past Medical History:  Diagnosis Date  . GERD (gastroesophageal reflux disease)   . History of nipple discharge 12/10/2015   Call if increases  . 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 Hx of migraines 08/13/2012  . Migraines   . Nipple discharge in female 07/15/2014  . Vaginal Pap smear, abnormal     Past Surgical History:  Procedure Laterality Date  . NO PAST SURGERIES      Social History   Socioeconomic History  . Marital status: Single    Spouse name: Not on file  . Number of children: 1  . Years of education: Not on file  . Highest education level: Not on file  Occupational History  . 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 Financial resource strain: Not on file  . Food insecurity    Worry: Not on file    Inability: Not on file  . Transportation needs    Medical: Not on file    Non-medical: Not on file  Tobacco Use  . Smoking status: Former Smoker    Types: Cigars  . Smokeless tobacco: Never Used  Substance and Sexual Activity  . Alcohol use: No  . Drug use: No  . Sexual activity: Yes    Birth control/protection: Pill  Lifestyle  . Physical activity    Days per week: Not on file    Minutes per session: Not on file  . Stress: Not on file  Relationships  . Social Musicianconnections    Talks on phone: Not on file    Gets  together: Not on file    Attends religious service: Not on file    Active member of club or organization: Not on file    Attends meetings of clubs or organizations: Not on file    Relationship status: Not on file  . Intimate partner violence    Fear of current or ex partner: Not on file    Emotionally abused: Not on file    Physically abused: Not on file    Forced sexual activity: Not on file  Other Topics Concern  . Not on file  Social History Narrative  . Not on file     No family history of premature CAD in 1st degree relatives.  Current Meds  Medication Sig  . amLODipine (NORVASC) 2.5 MG tablet Take 1 tablet (2.5 mg total) by mouth daily.  . busPIRone (BUSPAR) 5 MG tablet Take 1 tablet (5 mg total) by mouth 3 (three) times daily. (Patient taking differently: Take 5 mg by mouth as needed. )  . ketorolac (TORADOL) 10 MG tablet Take 1 tablet (10 mg total) by mouth every 6 (six) hours as needed. For migraine. Use sparingly  . Norethindrone-Ethinyl Estradiol-Fe Biphas (LO LOESTRIN FE) 1 MG-10 MCG / 10 MCG tablet Take 1 tablet by mouth daily. Take 1 daily by mouth  . tiZANidine (ZANAFLEX) 4 MG tablet Take 1 tablet (4 mg total) by mouth every 8 (eight) hours as needed for muscle spasms.  Marland Kitchen. topiramate (TOPAMAX) 50 MG tablet Take 1 tablet (50 mg total) by mouth daily. At bedtime      Review of systems complete and found to be negative unless listed above in HPI    Physical exam Blood pressure 125/87, pulse 83, temperature (!) 97.5 F (36.4 C), temperature source Temporal, height 5\' 6"  (1.676 m), weight 126 lb (57.2 kg), SpO2 99 %. General: NAD Neck: No JVD, no thyromegaly or thyroid nodule.  Lungs: Clear to auscultation bilaterally with normal respiratory effort. CV: Nondisplaced PMI. Regular rate and rhythm, normal S1/S2, no S3/S4, no murmur.  No peripheral edema.  No carotid bruit.    Abdomen: Soft, nontender, no distention.  Skin: Intact without lesions or rashes.   Neurologic: Alert and oriented x 3.  Psych: Normal affect. Extremities: No clubbing or cyanosis.  HEENT: Normal.   ECG: Most recent ECG reviewed.   Labs: Lab Results  Component Value Date/Time   K 3.9 09/12/2018 02:12 PM   BUN 14 09/12/2018 02:12 PM   CREATININE 0.89 09/12/2018 02:12 PM   CREATININE 0.66 11/18/2013 12:59 PM   ALT 17 09/12/2018 02:12 PM   TSH 1.030 06/12/2018 10:01 AM  HGB 13.4 06/12/2018 10:01 AM   HGB 11.8 08/06/2012     Lipids: Lab Results  Component Value Date/Time   LDLCALC 148 (H) 06/12/2018 10:01 AM   CHOL 216 (H) 06/12/2018 10:01 AM   TRIG 109 06/12/2018 10:01 AM   HDL 46 06/12/2018 10:01 AM        ASSESSMENT AND PLAN:  1.  Palpitations: She may be experiencing a normal accelerating heart rate with probable sinus tachycardia.  She denies any current anxiety.  I will obtain a 48-hour Holter monitor. I will order a 2-D echocardiogram with Doppler to evaluate cardiac structure, function, and regional wall motion.  2.  Hypertension: Blood pressure is normal.  Currently on amlodipine 2.5 mg daily.  3.  Anxiety: She takes buspirone 5 mg as needed and takes it once a month at the most.  She denies a history of panic attacks.     Disposition: Follow up in 3 months  Signed: Prentice DockerSuresh Koneswaran, M.D., F.A.C.C.  01/10/2019, 10:51 AM

## 2019-01-17 ENCOUNTER — Other Ambulatory Visit: Payer: Self-pay | Admitting: Adult Health

## 2019-01-23 ENCOUNTER — Telehealth: Payer: Self-pay

## 2019-01-23 MED ORDER — METOPROLOL TARTRATE 25 MG PO TABS: 12.5000 mg | ORAL_TABLET | Freq: Two times a day (BID) | ORAL | 3 refills | Status: DC

## 2019-01-23 NOTE — Telephone Encounter (Signed)
-----   Message from Herminio Commons, MD sent at 01/23/2019  3:44 PM EDT ----- No worrisome arrhythmias seen.  She is likely experiencing a normal accelerated heart rate (sinus tachycardia).  If her symptoms are bothersome she can try taking Lopressor 12.5 mg twice daily to see if this helps.

## 2019-01-23 NOTE — Telephone Encounter (Signed)
Pt made aware of results. She would like to try the medication. Sent RX to pharmacy.

## 2019-02-07 ENCOUNTER — Ambulatory Visit: Payer: Medicaid Other | Admitting: Neurology

## 2019-04-04 ENCOUNTER — Other Ambulatory Visit: Payer: Self-pay | Admitting: Family Medicine

## 2019-04-09 ENCOUNTER — Other Ambulatory Visit: Payer: Self-pay | Admitting: Adult Health

## 2019-04-09 MED ORDER — LO LOESTRIN FE 1 MG-10 MCG / 10 MCG PO TABS: 1.0000 | ORAL_TABLET | Freq: Every day | ORAL | 0 refills | Status: DC

## 2019-04-09 NOTE — Progress Notes (Signed)
Refilled lo loestrin °

## 2019-05-10 ENCOUNTER — Telehealth: Payer: Self-pay | Admitting: Cardiovascular Disease

## 2019-05-10 NOTE — Telephone Encounter (Signed)

## 2019-05-14 ENCOUNTER — Telehealth (INDEPENDENT_AMBULATORY_CARE_PROVIDER_SITE_OTHER): Payer: Medicaid Other | Admitting: Cardiovascular Disease

## 2019-05-14 ENCOUNTER — Encounter: Payer: Self-pay | Admitting: Cardiovascular Disease

## 2019-05-14 VITALS — BP 138/93 | HR 97 | Ht 65.0 in | Wt 126.0 lb

## 2019-05-14 DIAGNOSIS — Z7189 Other specified counseling: Secondary | ICD-10-CM | POA: Diagnosis not present

## 2019-05-14 DIAGNOSIS — F419 Anxiety disorder, unspecified: Secondary | ICD-10-CM | POA: Diagnosis not present

## 2019-05-14 DIAGNOSIS — R002 Palpitations: Secondary | ICD-10-CM | POA: Diagnosis not present

## 2019-05-14 DIAGNOSIS — I10 Essential (primary) hypertension: Secondary | ICD-10-CM | POA: Diagnosis not present

## 2019-05-14 NOTE — Progress Notes (Signed)
Virtual Visit via Telephone Note   This visit type was conducted due to national recommendations for restrictions regarding the COVID-19 Pandemic (e.g. social distancing) in an effort to limit this patient's exposure and mitigate transmission in our community.  Due to her co-morbid illnesses, this patient is at least at moderate risk for complications without adequate follow up.  This format is felt to be most appropriate for this patient at this time.  The patient did not have access to video technology/had technical difficulties with video requiring transitioning to audio format only (telephone).  All issues noted in this document were discussed and addressed.  No physical exam could be performed with this format.  Please refer to the patient's chart for her  consent to telehealth for [facility].   Date:  05/14/2019   ID:  Melanie Gonzales, DOB [birth year], MRN [id number]  Patient Location: Home Provider Location: Office  PCP:  Mikey Kirschner, MD  Cardiologist:  No primary care provider on file.  Electrophysiologist:  None   Evaluation Performed:  Follow-Up Visit  Chief Complaint:  Palpitations  History of Present Illness:    Melanie Gonzales is a 28 y.o. female with palpitations.   She denies dizziness. She feels like metoprolol helped alleviate her symptoms. She has been struggling with anxiety over the last few months.  She really feels like anxiety is the primary issue which needs treatment.    Past Medical History:  Diagnosis Date  . GERD (gastroesophageal reflux disease)   . History of nipple discharge 12/10/2015   Call if increases  . 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 Hx of migraines 08/13/2012  . Migraines   . Nipple discharge in female 07/15/2014  . Vaginal Pap smear, abnormal    Past Surgical History:  Procedure Laterality Date  . NO PAST SURGERIES       Current Meds  Medication Sig  . amLODipine (NORVASC) 2.5 MG tablet Take 1 tablet (2.5 mg total) by  mouth daily.  . busPIRone (BUSPAR) 5 MG tablet TAKE 1 TABLET(5 MG) BY MOUTH THREE TIMES DAILY  . ketorolac (TORADOL) 10 MG tablet TAKE 1 TABLET BY MOUTH EVERY 6 HOURS AS NEEDED FOR MIGRAINE USE SPARINGLY  . Norethindrone-Ethinyl Estradiol-Fe Biphas (LO LOESTRIN FE) 1 MG-10 MCG / 10 MCG tablet Take 1 tablet by mouth daily.  Marland Kitchen tiZANidine (ZANAFLEX) 4 MG tablet Take 1 tablet (4 mg total) by mouth every 8 (eight) hours as needed for muscle spasms.  Marland Kitchen topiramate (TOPAMAX) 50 MG tablet Take 1 tablet (50 mg total) by mouth daily. At bedtime     Allergies:   5-alpha reductase inhibitors and Penicillins   Social History   Tobacco Use  . Smoking status: Former Smoker    Types: Cigars  . Smokeless tobacco: Never Used  Substance Use Topics  . Alcohol use: No  . Drug use: No     Family Hx: The patient's family history includes Deep vein thrombosis in her father; GER disease in her mother; Hypertension in her paternal grandmother; Hyperthyroidism in her daughter; Other in her daughter; Seizures in her daughter; Thyroid disease in her paternal grandmother; Ulcers in her mother. There is no history of Gallbladder disease, Colon cancer, or Crohn's disease.  ROS:   Please see the history of present illness.     All other systems reviewed and are negative.   Prior CV studies:   The following studies were reviewed today:  Echo 01/10/19:   1. The left ventricle has normal systolic  function with an ejection fraction of 60-65%. The cavity size was normal. Left ventricular diastolic parameters were normal. No evidence of left ventricular regional wall motion abnormalities.  2. The right ventricle has normal systolic function. The cavity was normal. There is no increase in right ventricular wall thickness.  3. The aortic valve is tricuspid.  4. The aorta is normal unless otherwise noted.   Event monitor (August 2020):   Sinus rhythm and occasional sinus tachycardia. Sinus arrhythmia seen. Isolated  PVC.  No concerning arrhythmias.  Labs/Other Tests and Data Reviewed:    EKG:  No ECG reviewed.  Recent Labs: 06/12/2018: Hemoglobin 13.4; Platelets 350; TSH 1.030 09/12/2018: ALT 17; BUN 14; Creatinine, Ser 0.89; Potassium 3.9; Sodium 139   Recent Lipid Panel Lab Results  Component Value Date/Time   CHOL 216 (H) 06/12/2018 10:01 AM   TRIG 109 06/12/2018 10:01 AM   HDL 46 06/12/2018 10:01 AM   CHOLHDL 4.7 (H) 06/12/2018 10:01 AM   LDLCALC 148 (H) 06/12/2018 10:01 AM    Wt Readings from Last 3 Encounters:  05/14/19 126 lb (57.2 kg)  01/10/19 126 lb (57.2 kg)  09/12/18 125 lb (56.7 kg)     Objective:    Vital Signs:  BP (!) 138/93   Pulse 97   Ht 5\' 5"  (1.651 m)   Wt 126 lb (57.2 kg)   BMI 20.97 kg/m    VITAL SIGNS:  reviewed  ASSESSMENT & PLAN:    1.  Palpitations: Symptoms are driven by anxiety leading to sinus tachycardia.  While she did experience relief with metoprolol 12.5 mg twice daily, she feels that the driving issue is her anxiety which needs to be treated.  She will follow up with her PCP regarding this.  Echocardiogram and event monitor reviewed above, both of which were normal.  2.  Hypertension: Blood pressure is mildly elevated.  This will need further monitoring by her PCP.  3.  Anxiety: She has been struggling with worsening anxiety over the last 3 months.  She will speak to her PCP regarding this.   COVID-19 Education: The signs and symptoms of COVID-19 were discussed with the patient and how to seek care for testing (follow up with PCP or arrange E-visit).  The importance of social distancing was discussed today.  Time:   Today, I have spent 10 minutes with the patient with telehealth technology discussing the above problems.     Medication Adjustments/Labs and Tests Ordered: Current medicines are reviewed at length with the patient today.  Concerns regarding medicines are outlined above.   Tests Ordered: No orders of the defined types were  placed in this encounter.   Medication Changes: No orders of the defined types were placed in this encounter.   Follow Up:  Virtual Visit  prn  Signed, , MD  05/14/2019 8:39 AM    [facility]

## 2019-05-14 NOTE — Patient Instructions (Signed)
Medication Instructions:  Your physician recommends that you continue on your current medications as directed. Please refer to the Current Medication list given to you today.  *If you need a refill on your cardiac medications before your next appointment, please call your pharmacy*  Lab Work: None today If you have labs (blood work) drawn today and your tests are completely normal, you will receive your results only by: . MyChart Message (if you have MyChart) OR . A paper copy in the mail If you have any lab test that is abnormal or we need to change your treatment, we will call you to review the results.  Testing/Procedures: None today  Follow-Up: At [facility], you and your health needs are our priority.  As part of our continuing mission to provide you with exceptional heart care, we have created designated Provider Care Teams.  These Care Teams include your primary Cardiologist (physician) and Advanced Practice Providers (APPs -  Physician Assistants and Nurse Practitioners) who all work together to provide you with the care you need, when you need it.    Follow up as needed !     Thank you for choosing [facility] !        

## 2019-06-25 ENCOUNTER — Other Ambulatory Visit: Payer: Self-pay

## 2019-06-26 ENCOUNTER — Other Ambulatory Visit: Payer: Self-pay | Admitting: *Deleted

## 2019-06-26 MED ORDER — LO LOESTRIN FE 1 MG-10 MCG / 10 MCG PO TABS: 1.0000 | ORAL_TABLET | Freq: Every day | ORAL | 0 refills | Status: DC

## 2019-06-30 ENCOUNTER — Ambulatory Visit
Admission: EM | Admit: 2019-06-30 | Discharge: 2019-06-30 | Disposition: A | Discharge status: Home / Self Care | Payer: Medicaid Other | Attending: Emergency Medicine | Admitting: Emergency Medicine

## 2019-06-30 ENCOUNTER — Other Ambulatory Visit: Payer: Self-pay

## 2019-06-30 DIAGNOSIS — Z20822 Contact with and (suspected) exposure to covid-19: Secondary | ICD-10-CM

## 2019-06-30 MED ORDER — BENZONATATE 100 MG PO CAPS: 100.0000 mg | ORAL_CAPSULE | Freq: Three times a day (TID) | ORAL | 0 refills | Status: DC

## 2019-06-30 MED ORDER — FLUTICASONE PROPIONATE 50 MCG/ACT NA SUSP: 1.0000 | Freq: Every day | NASAL | 0 refills | Status: DC

## 2019-06-30 NOTE — Discharge Instructions (Addendum)
COVID testing ordered.  It will take between 2-7 days for test results.  Someone will contact you regarding abnormal results.   ° °In the meantime: °You should remain isolated in your home for 10 days from symptom onset AND greater than 72 hours after symptoms resolution (absence of fever without the use of fever-reducing medication and improvement in respiratory symptoms), whichever is longer °Get plenty of rest and push fluids °Tessalon Perles prescribed for cough °Zyrtec-D prescribed for nasal congestion, runny nose, and/or sore throat °Flonase prescribed for nasal congestion and runny nose °Use medications daily for symptom relief °Use OTC medications like ibuprofen or tylenol as needed fever or pain °Call or go to the ED if you have any new or worsening symptoms such as fever, worsening cough, shortness of breath, chest tightness, chest pain, turning blue, changes in mental status, etc...  °

## 2019-06-30 NOTE — ED Triage Notes (Signed)
Pt presents to UC for covid test. Pt c/o slight runny nose, body aches, slight cough x2 days. Is unsure of covid exposure.

## 2019-06-30 NOTE — ED Provider Notes (Signed)
[facility]    CSN: [id number] Arrival date & time: 06/30/19  1329      History   Chief Complaint Chief Complaint  Patient presents with  . covid test/sxs    HPI Melanie Gonzales is a 29 y.o. female.   who presents to the urgent care with a complaint of body aches, runny nose and cough for the past 2 days.  Denies sick exposure to COVID, flu or strep.  Denies recent travel.  Denies aggravating or alleviating symptoms.  Denies previous COVID infection.   Denies fever, chills, fatigue, nasal congestion, sore throat,  SOB, wheezing, chest pain, nausea, vomiting, changes in bowel or bladder habits.    The history is provided by the patient. No language interpreter was used.    Past Medical History:  Diagnosis Date  . GERD (gastroesophageal reflux disease)   . History of nipple discharge 12/10/2015   Call if increases  . 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 Hx of migraines 08/13/2012  . Migraines   . Nipple discharge in female 07/15/2014  . Vaginal Pap smear, abnormal     Patient Active Problem List   Diagnosis Date Noted  . Elevated BP without diagnosis of hypertension 05/28/2018  . Encounter for surveillance of contraceptive pills 05/28/2018  . Routine cervical smear 01/24/2018  . Encounter for gynecological examination with Papanicolaou smear of cervix 01/24/2018  . Anxiety 12/26/2017  . Decreased libido 12/26/2017  . Hair loss 12/26/2017  . Chronic bilateral low back pain without sciatica 02/02/2017  . Migraine without aura and without status migrainosus, not intractable 03/05/2016  . ASCUS favor benign 12/10/2015  . History of nipple discharge 12/10/2015  . Constipation 09/08/2014  . Nipple discharge in female 07/15/2014  . Abdominal pain, chronic, epigastric 03/10/2014  . GERD (gastroesophageal reflux disease) 10/11/2013  . Irritable bowel syndrome 10/11/2013  . 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 Molluscum contagiosum 12/18/2012  . Hx of migraines 08/13/2012     Past Surgical History:  Procedure Laterality Date  . NO PAST SURGERIES      OB History    Gravida  1   Para  1   Term  1   Preterm      AB      Living  1     SAB      TAB      Ectopic      Multiple      Live Births  1            Home Medications    Prior to Admission medications   Medication Sig Start Date End Date Taking? Authorizing Provider  amLODipine (NORVASC) 2.5 MG tablet Take 1 tablet (2.5 mg total) by mouth daily. 09/12/18   Adline Potter, NP  benzonatate (TESSALON) 100 MG capsule Take 1 capsule (100 mg total) by mouth every 8 (eight) hours. 06/30/19   Jazlene Bares, Zachery Dakins, FNP  busPIRone (BUSPAR) 5 MG tablet TAKE 1 TABLET(5 MG) BY MOUTH THREE TIMES DAILY 01/18/19   Cheral Marker, CNM  fluticasone [facility]) 50 MCG/ACT nasal spray Place 1 spray into both nostrils daily for 14 days. 06/30/19 07/14/19  Martinez Boxx, Zachery Dakins, FNP  ketorolac (TORADOL) 10 MG tablet TAKE 1 TABLET BY MOUTH EVERY 6 HOURS AS NEEDED FOR MIGRAINE USE SPARINGLY 04/04/19   Merlyn Albert, MD  metoprolol tartrate (LOPRESSOR) 25 MG tablet Take 0.5 tablets (12.5 mg total) by mouth 2 (two) times daily. 01/23/19 04/23/19  Laqueta Linden, MD  Norethindrone-Ethinyl  Estradiol-Fe Biphas (LO LOESTRIN FE) 1 MG-10 MCG / 10 MCG tablet Take 1 tablet by mouth daily. 06/26/19   Estill Dooms, NP  tiZANidine (ZANAFLEX) 4 MG tablet Take 1 tablet (4 mg total) by mouth every 8 (eight) hours as needed for muscle spasms. 03/28/18   Mikey Kirschner, MD  topiramate (TOPAMAX) 50 MG tablet Take 1 tablet (50 mg total) by mouth daily. At bedtime 03/28/18   Mikey Kirschner, MD    Family History Family History  Problem Relation Age of Onset  . Deep vein thrombosis Father   . Hypertension Paternal Grandmother   . Thyroid disease Paternal Grandmother        has had thyroid removed  . Ulcers Mother   . GER disease Mother   . Seizures Daughter   . Hyperthyroidism Daughter   . Other  Daughter        meningitis 2 weeks after birth  . Gallbladder disease Neg Hx   . Colon cancer Neg Hx   . Crohn's disease Neg Hx     Social History Social History   Tobacco Use  . Smoking status: Former Smoker    Types: Cigars  . Smokeless tobacco: Never Used  Substance Use Topics  . Alcohol use: No  . Drug use: No     Allergies   5-alpha reductase inhibitors and Penicillins   Review of Systems Review of Systems  Constitutional: Negative.   HENT: Positive for rhinorrhea.   Respiratory: Positive for cough.   Cardiovascular: Negative.   Gastrointestinal: Negative.   Musculoskeletal:       Body aches  Neurological: Negative.      Physical Exam Triage Vital Signs ED Triage Vitals  Enc Vitals Group     BP      Pulse      Resp      Temp      Temp src      SpO2      Weight      Height      Head Circumference      Peak Flow      Pain Score      Pain Loc      Pain Edu?      Excl. in [place]?    No data found.  Updated Vital Signs BP (!) 135/93 (BP Location: Right Arm)   Pulse 84   Temp 99.6 F (37.6 C) (Oral)   Resp 16   SpO2 98%   Visual Acuity Right Eye Distance:   Left Eye Distance:   Bilateral Distance:    Right Eye Near:   Left Eye Near:    Bilateral Near:     Physical Exam Vitals and nursing note reviewed.  Constitutional:      General: She is not in acute distress.    Appearance: Normal appearance. She is normal weight. She is not ill-appearing or toxic-appearing.  HENT:     Head: Normocephalic.     Right Ear: Tympanic membrane, ear canal and external ear normal. There is no impacted cerumen.     Left Ear: Tympanic membrane, ear canal and external ear normal. There is no impacted cerumen.     Nose: Nose normal. No congestion.     Mouth/Throat:     Mouth: Mucous membranes are moist.     Pharynx: Oropharynx is clear. No oropharyngeal exudate or posterior oropharyngeal erythema.  Cardiovascular:     Rate and Rhythm: Normal rate and regular  rhythm.  Pulses: Normal pulses.     Heart sounds: Normal heart sounds. No murmur.  Pulmonary:     Effort: Pulmonary effort is normal. No respiratory distress.     Breath sounds: Normal breath sounds. No wheezing or rhonchi.  Chest:     Chest wall: No tenderness.  Abdominal:     General: Abdomen is flat. Bowel sounds are normal. There is no distension.     Palpations: There is no mass.     Tenderness: There is no abdominal tenderness.  Skin:    Capillary Refill: Capillary refill takes less than 2 seconds.  Neurological:     General: No focal deficit present.     Mental Status: She is alert and oriented to person, place, and time.      UC Treatments / Results  Labs (all labs ordered are listed, but only abnormal results are displayed) Labs Reviewed  NOVEL CORONAVIRUS, NAA    EKG   Radiology No results found.  Procedures Procedures (including critical care time)  Medications Ordered in UC Medications - No data to display  Initial Impression / Assessment and Plan / UC Course  I have reviewed the triage vital signs and the nursing notes.  Pertinent labs & imaging results that were available during my care of the patient were reviewed by me and considered in my medical decision making (see chart for details).    COVID-19 test was ordered Advised patient to quarantine Flonase will be prescribed Tessalon Perles be prescribed Advised to take OTC Tylenol or ibuprofen for body aches To go to ED for worsening symptoms Verbalized understanding of the plan of care  Final Clinical Impressions(s) / UC Diagnoses   Final diagnoses:  Suspected COVID-19 virus infection     Discharge Instructions     COVID testing ordered.  It will take between 2-7 days for test results.  Someone will contact you regarding abnormal results.    In the meantime: You should remain isolated in your home for 10 days from symptom onset AND greater than 72 hours after symptoms resolution  (absence of fever without the use of fever-reducing medication and improvement in respiratory symptoms), whichever is longer Get plenty of rest and push fluids Tessalon Perles prescribed for cough Zyrtec-D prescribed for nasal congestion, runny nose, and/or sore throat Flonase prescribed for nasal congestion and runny nose Use medications daily for symptom relief Use OTC medications like ibuprofen or tylenol as needed fever or pain Call or go to the ED if you have any new or worsening symptoms such as fever, worsening cough, shortness of breath, chest tightness, chest pain, turning blue, changes in mental status, etc...     ED Prescriptions    Medication Sig Dispense Auth. Provider   fluticasone (FLONASE) 50 MCG/ACT nasal spray Place 1 spray into both nostrils daily for 14 days. 16 g Layken Beg S, FNP   benzonatate (TESSALON) 100 MG capsule Take 1 capsule (100 mg total) by mouth every 8 (eight) hours. 21 capsule Jearlene Bridwell, Zachery Dakins, FNP     PDMP not reviewed this encounter.   Durward Parcel, FNP 06/30/19 1433

## 2019-07-01 ENCOUNTER — Encounter: Payer: Self-pay | Admitting: Family Medicine

## 2019-07-01 LAB — NOVEL CORONAVIRUS, NAA: SARS-CoV-2, NAA: NOT DETECTED

## 2019-07-22 ENCOUNTER — Other Ambulatory Visit: Payer: Self-pay | Admitting: *Deleted

## 2019-07-22 MED ORDER — BUSPIRONE HCL 5 MG PO TABS: ORAL_TABLET | ORAL | 1 refills | Status: DC

## 2019-08-01 DIAGNOSIS — Z23 Encounter for immunization: Secondary | ICD-10-CM | POA: Diagnosis not present

## 2019-08-02 ENCOUNTER — Ambulatory Visit
Admission: EM | Admit: 2019-08-02 | Discharge: 2019-08-02 | Disposition: A | Discharge status: Home / Self Care | Payer: Medicaid Other | Attending: Emergency Medicine | Admitting: Emergency Medicine

## 2019-08-02 ENCOUNTER — Other Ambulatory Visit: Payer: Self-pay

## 2019-08-02 ENCOUNTER — Ambulatory Visit (INDEPENDENT_AMBULATORY_CARE_PROVIDER_SITE_OTHER): Payer: Medicaid Other | Admitting: Nurse Practitioner

## 2019-08-02 DIAGNOSIS — R6889 Other general symptoms and signs: Secondary | ICD-10-CM | POA: Diagnosis not present

## 2019-08-02 DIAGNOSIS — Z20822 Contact with and (suspected) exposure to covid-19: Secondary | ICD-10-CM

## 2019-08-02 DIAGNOSIS — U071 COVID-19: Secondary | ICD-10-CM

## 2019-08-02 LAB — POC SARS CORONAVIRUS 2 AG -  ED: SARS Coronavirus 2 Ag: POSITIVE — AB

## 2019-08-02 MED ORDER — AZITHROMYCIN 250 MG PO TABS: ORAL_TABLET | ORAL | 0 refills | Status: DC

## 2019-08-02 MED ORDER — CETIRIZINE HCL 10 MG PO TABS: 10.0000 mg | ORAL_TABLET | Freq: Every day | ORAL | 0 refills | Status: DC

## 2019-08-02 MED ORDER — BENZONATATE 100 MG PO CAPS: 100.0000 mg | ORAL_CAPSULE | Freq: Three times a day (TID) | ORAL | 0 refills | Status: DC

## 2019-08-02 MED ORDER — HYDROCODONE-HOMATROPINE 5-1.5 MG/5ML PO SYRP: 5.0000 mL | ORAL_SOLUTION | ORAL | 0 refills | Status: DC | PRN

## 2019-08-02 NOTE — ED Provider Notes (Signed)
[place]   [id number] 08/02/19 Arrival Time: 1000   CC: COVID symptoms  SUBJECTIVE: History from: patient.  Melanie Gonzales is a 29 y.o. female who presents with headache, dry cough, 3 episodes of watery diarrhea, and body aches x 1 day.  Denies sick exposure to COVID, flu or strep.  Received 1st COVID vaccine yesterday.  Has tried  OTC medications without relief.   Denies previous COVID infection in the past.   Denies fever, chills, sore throat, SOB, wheezing, chest pain, nausea, vomiting, changes in bladder habits.     ROS: As per HPI.  All other pertinent ROS negative.     Past Medical History:  Diagnosis Date  . GERD (gastroesophageal reflux disease)   . History of nipple discharge 12/10/2015   Call if increases  . 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 Hx of migraines 08/13/2012  . Migraines   . Nipple discharge in female 07/15/2014  . Vaginal Pap smear, abnormal    Past Surgical History:  Procedure Laterality Date  . NO PAST SURGERIES     Allergies  Allergen Reactions  . 5-Alpha Reductase Inhibitors   . Penicillins Hives   No current facility-administered medications on file prior to encounter.   Current Outpatient Medications on File Prior to Encounter  Medication Sig Dispense Refill  . amLODipine (NORVASC) 2.5 MG tablet Take 1 tablet (2.5 mg total) by mouth daily. 90 tablet 3  . busPIRone (BUSPAR) 5 MG tablet TAKE 1 TABLET(5 MG) BY MOUTH THREE TIMES DAILY 90 tablet 1  . fluticasone (FLONASE) 50 MCG/ACT nasal spray Place 1 spray into both nostrils daily for 14 days. 16 g 0  . ketorolac (TORADOL) 10 MG tablet TAKE 1 TABLET BY MOUTH EVERY 6 HOURS AS NEEDED FOR MIGRAINE USE SPARINGLY 20 tablet 5  . metoprolol tartrate (LOPRESSOR) 25 MG tablet Take 0.5 tablets (12.5 mg total) by mouth 2 (two) times daily. 90 tablet 3  . Norethindrone-Ethinyl Estradiol-Fe Biphas (LO LOESTRIN FE) 1 MG-10 MCG / 10 MCG tablet Take 1 tablet by mouth daily. 1 Package 0  . tiZANidine  (ZANAFLEX) 4 MG tablet Take 1 tablet (4 mg total) by mouth every 8 (eight) hours as needed for muscle spasms. 30 tablet 5  . topiramate (TOPAMAX) 50 MG tablet Take 1 tablet (50 mg total) by mouth daily. At bedtime 30 tablet 5   Social History   Socioeconomic History  . Marital status: Single    Spouse name: Not on file  . Number of children: 1  . Years of education: Not on file  . Highest education level: Not on file  Occupational History  . 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 Smoking status: Former Smoker    Types: Cigars  . Smokeless tobacco: Never Used  Substance and Sexual Activity  . Alcohol use: No  . Drug use: No  . Sexual activity: Yes    Birth control/protection: Pill  Other Topics Concern  . Not on file  Social History Narrative  . Not on file   Social Determinants of Health   Financial Resource Strain:   . Difficulty of Paying Living Expenses: Not on file  Food Insecurity:   . Worried About Charity fundraiser in the Last Year: Not on file  . Ran Out of Food in the Last Year: Not on file  Transportation Needs:   . Lack of Transportation (Medical): Not on file  . Lack of Transportation (Non-Medical): Not on  file  Physical Activity:   . Days of Exercise per Week: Not on file  . Minutes of Exercise per Session: Not on file  Stress:   . Feeling of Stress : Not on file  Social Connections:   . Frequency of Communication with Friends and Family: Not on file  . Frequency of Social Gatherings with Friends and Family: Not on file  . Attends Religious Services: Not on file  . Active Member of Clubs or Organizations: Not on file  . Attends Archivist Meetings: Not on file  . Marital Status: Not on file  Intimate Partner Violence:   . Fear of Current or Ex-Partner: Not on file  . Emotionally Abused: Not on file  . Physically Abused: Not on file  . Sexually Abused: Not on file   Family History  Problem Relation Age of Onset  .  Deep vein thrombosis Father   . Hypertension Paternal Grandmother   . Thyroid disease Paternal Grandmother        has had thyroid removed  . Ulcers Mother   . GER disease Mother   . Seizures Daughter   . Hyperthyroidism Daughter   . Other Daughter        meningitis 2 weeks after birth  . Gallbladder disease Neg Hx   . Colon cancer Neg Hx   . Crohn's disease Neg Hx     OBJECTIVE:  Vitals:   08/02/19 1007  BP: 122/88  Pulse: (!) 116  Resp: 16  Temp: 99.7 F (37.6 C)  TempSrc: Oral  SpO2: 97%     General appearance: alert; appears fatigued, but nontoxic; speaking in full sentences and tolerating own secretions HEENT: NCAT; Ears: EACs clear, TMs pearly gray; Eyes: PERRL.  EOM grossly intact.  Nose: nares patent with rhinorrhea, Throat: oropharynx clear, tonsils non erythematous or enlarged, uvula midline  Neck: supple without LAD Lungs: unlabored respirations, symmetrical air entry; cough: absent; no respiratory distress; CTAB Heart:Tachycardia Skin: warm and dry Psychological: alert and cooperative; normal mood and affect  LABS:  Results for orders placed or performed during the hospital encounter of 08/02/19 (from the past 24 hour(s))  POC SARS Coronavirus 2 Ag-ED - Nasal Swab (BD Veritor Kit)     Status: Abnormal   Collection Time: 08/02/19 10:24 AM  Result Value Ref Range   SARS Coronavirus 2 Ag Positive (A) Negative     ASSESSMENT & PLAN:  1. COVID-19 virus infection   2. Flu-like symptoms   3. Suspected COVID-19 virus infection     Meds ordered this encounter  Medications  . benzonatate (TESSALON) 100 MG capsule    Sig: Take 1 capsule (100 mg total) by mouth every 8 (eight) hours.    Dispense:  21 capsule    Refill:  0    Order Specific Question:   Supervising Provider    Answer:   Raylene Everts [[id number]]  . cetirizine (ZYRTEC) 10 MG tablet    Sig: Take 1 tablet (10 mg total) by mouth daily.    Dispense:  30 tablet    Refill:  0    Order Specific  Question:   Supervising Provider    Answer:   Raylene Everts [[id number]]    COVID test was positive You should remain isolated in your home for 10 days from symptom onset AND greater than 72 hours after symptoms resolution (absence of fever without the use of fever-reducing medication and improvement in respiratory symptoms), whichever is longer Get plenty of rest  and push fluids Tessalon perles for cough zyrtec for nasal congestion, runny nose, and/or sore throat flonase for nasal congestion and runny nose Use OTC medications like ibuprofen or tylenol as needed fever or pain Follow up with PCP in 1-2 days via phone or e-visit for recheck and to ensure symptoms are improving Call or go to the ED if you have any new or worsening symptoms such as fever, worsening cough, shortness of breath, chest tightness, chest pain, turning blue, changes in mental status, etc...     Reviewed expectations re: course of current medical issues. Questions answered. Outlined signs and symptoms indicating need for more acute intervention. Patient verbalized understanding. After Visit Summary given.         Lestine Box, PA-C 08/02/19 1050

## 2019-08-02 NOTE — Discharge Instructions (Addendum)
COVID test was positive You should remain isolated in your home for 10 days from symptom onset AND greater than 72 hours after symptoms resolution (absence of fever without the use of fever-reducing medication and improvement in respiratory symptoms), whichever is longer Get plenty of rest and push fluids Tessalon perles for cough zyrtec for nasal congestion, runny nose, and/or sore throat flonase for nasal congestion and runny nose Use OTC medications like ibuprofen or tylenol as needed fever or pain Follow up with PCP in 1-2 days via phone or e-visit for recheck and to ensure symptoms are improving Call or go to the ED if you have any new or worsening symptoms such as fever, worsening cough, shortness of breath, chest tightness, chest pain, turning blue, changes in mental status, etc..Marland Kitchen

## 2019-08-02 NOTE — Progress Notes (Signed)
Phone visit Subjective:    Patient ID: Melanie Gonzales, female    DOB: Jul 31, 1990, 29 y.o.   MRN: [id number]  HPIpt had covid vaccine yesterday and then felt bad after the vaccine. Was having headache, body aches, some diarrhea, fatigue. This morning could not smell or taste anything. No sob but chest feels heavy. Had a rapid test this morning at urgent care. She did see a doctor at urgent care and was told to follow up with pcp in a few days but pt wanted to see if she could get some medicine for her symptoms.   Virtual Visit via Telephone Note  I connected with Melanie Gonzales on 08/02/19 at  2:20 PM EST by telephone and verified that I am speaking with the correct person using two identifiers.  Location: Patient: home Provider: office   I discussed the limitations, risks, security and privacy concerns of performing an evaluation and management service by telephone and the availability of in person appointments. I also discussed with the patient that there may be a patient responsible charge related to this service. The patient expressed understanding and agreed to proceed.   History of Present Illness: Presents for follow-up after positive Covid test at urgent care earlier today.  Complaints of headache nonproductive cough low-grade fever chest pressure.  States her breathing is fine, denies any shortness of breath or wheezing.  Taking fluids well.  Voiding normal limit.  States she does not have any sense of smell or taste.  Myalgias and fatigue.  Has had 3 episodes of watery diarrhea.  Had her Covid vaccine yesterday afternoon.  Calling to see if any medications can be given to help with her symptoms.  Concerned because her father recently had Covid and was diagnosed with pneumonia.   Observations/Objective: Today's visit was via telephone Physical exam was not possible for this visit Alert, oriented.  Thoughts logical coherent and relevant.  Assessment and Plan: COVID-19  Meds  ordered this encounter  Medications  . HYDROcodone-homatropine (HYCODAN) 5-1.5 MG/5ML syrup    Sig: Take 5 mLs by mouth every 4 (four) hours as needed. For cough    Dispense:  90 mL    Refill:  0    Order Specific Question:   Supervising Provider    Answer:   Sallee Lange A [[id number]]  . azithromycin (ZITHROMAX Z-PAK) 250 MG tablet    Sig: Take 2 tablets (500 mg) on  Day 1,  followed by 1 tablet (250 mg) once daily on Days 2 through 5.    Dispense:  6 each    Refill:  0    Hold unless patient calls for medication. Thanks.    Order Specific Question:   Supervising Provider    Answer:   Sallee Lange A [[id number]]    Follow Up Instructions: Lengthy discussion regarding Covid including warning signs such as worsening cough, shortness of breath chest pain vomiting.  Continue clear fluid intake.  Bland foods as tolerated. Based on patient's description  of her symptoms and the fact that her lungs were clear earlier today at urgent care, there is no evidence at this time that she has pneumonia and this was explained to patient.  We will send in a prescription for Zithromax to have on hold for the next few days in case it is needed.  Added hydrocodone cough syrup to her regimen.  Drowsiness precautions.  Her daughter has special needs but she has someone there to help. Call back early next week if no improvement,  go to urgent care or ED over the weekend if worse. I discussed the assessment and treatment plan with the patient. The patient was provided an opportunity to ask questions and all were answered. The patient agreed with the plan and demonstrated an understanding of the instructions.   The patient was advised to call back or seek an in-person evaluation if the symptoms worsen or if the condition fails to improve as anticipated.  I provided 15 minutes of non-face-to-face time during this encounter.        Review of Systems     Objective:   Physical Exam        Assessment & Plan:

## 2019-08-02 NOTE — ED Triage Notes (Signed)
Pt presents to UC w/ c/o headache, dry cough, diarrhea, body aches since yesterday. 1st covid vaccine yesterday.

## 2019-08-03 ENCOUNTER — Encounter: Payer: Self-pay | Admitting: Nurse Practitioner

## 2019-08-07 ENCOUNTER — Telehealth: Payer: Self-pay | Admitting: Family Medicine

## 2019-08-07 ENCOUNTER — Other Ambulatory Visit: Payer: Self-pay | Admitting: Adult Health

## 2019-08-07 NOTE — Telephone Encounter (Signed)
Pt tested positive on 08/02/2019. Please advise. Thank you

## 2019-08-07 NOTE — Telephone Encounter (Signed)
This is common with covid, otc med treatment with ibuprofen 600 to 800 mg tid, we do not recommedn anything strongser. Of course must try to avoid caydence getting sick with this

## 2019-08-07 NOTE — Telephone Encounter (Signed)
Pt is currently in quarantine for COVID. She is having bad lower back pain radiating to stomach. Cant sit for long time. She would like to know if this is something she should be concerned with. Offered virtual this afternoon pt wanted to wait on nurse to call.

## 2019-08-07 NOTE — Telephone Encounter (Signed)
Pt contacted and verbalized understanding.  

## 2019-08-08 ENCOUNTER — Encounter: Payer: Self-pay | Admitting: Nurse Practitioner

## 2019-08-08 ENCOUNTER — Other Ambulatory Visit: Payer: Self-pay | Admitting: Family Medicine

## 2019-08-08 MED ORDER — TRAMADOL HCL 50 MG PO TABS: ORAL_TABLET | ORAL | 0 refills | Status: DC

## 2019-08-20 ENCOUNTER — Ambulatory Visit
Admission: EM | Admit: 2019-08-20 | Discharge: 2019-08-20 | Disposition: A | Discharge status: Home / Self Care | Payer: Medicaid Other | Attending: Emergency Medicine | Admitting: Emergency Medicine

## 2019-08-20 ENCOUNTER — Ambulatory Visit (INDEPENDENT_AMBULATORY_CARE_PROVIDER_SITE_OTHER): Payer: Medicaid Other

## 2019-08-20 ENCOUNTER — Other Ambulatory Visit: Payer: Self-pay

## 2019-08-20 DIAGNOSIS — R079 Chest pain, unspecified: Secondary | ICD-10-CM

## 2019-08-20 DIAGNOSIS — Z8616 Personal history of COVID-19: Secondary | ICD-10-CM

## 2019-08-20 DIAGNOSIS — R0789 Other chest pain: Secondary | ICD-10-CM

## 2019-08-20 DIAGNOSIS — U071 COVID-19: Secondary | ICD-10-CM | POA: Diagnosis not present

## 2019-08-20 HISTORY — DX: Essential (primary) hypertension: I10

## 2019-08-20 NOTE — ED Triage Notes (Signed)
Pt presents with complaints of chest pain that started early this morning. Pain is discomfort in the middle of her chest. States she tried taking an antibiotic that she had when she was positive for COVID the beginning of March. Pt denies any other symptoms.

## 2019-08-20 NOTE — ED Provider Notes (Signed)
[facility]    CSN: [id number] Arrival date & time: 08/20/19  1637      History   Chief Complaint Chief Complaint  Patient presents with  . Chest Pain    HPI Melanie Gonzales is a 29 y.o. female.   Who presented to the urgent care with a complaint of right chest tightness for the past 1 day. Report pain does not radiate and is felt while deep breathing. Denies sick exposure to flu or strep.  Was diagnosed with Covid-19 on 08/02/2019.  Received first COVID-19 vaccine on 08/01/2019.  Has tried OTC medication without relief.  Denies previous Covid infection.  Denies fever, chills, sore throat, shortness of breath, wheezing, chest pain, nausea, vomiting, change in bowel admit  The history is provided by the patient. No language interpreter was used.    Past Medical History:  Diagnosis Date  . GERD (gastroesophageal reflux disease)   . History of nipple discharge 12/10/2015   Call if increases  . 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 Hx of migraines 08/13/2012  . Hypertension   . Migraines   . Nipple discharge in female 07/15/2014  . Vaginal Pap smear, abnormal     Patient Active Problem List   Diagnosis Date Noted  . Elevated BP without diagnosis of hypertension 05/28/2018  . Encounter for surveillance of contraceptive pills 05/28/2018  . Routine cervical smear 01/24/2018  . Encounter for gynecological examination with Papanicolaou smear of cervix 01/24/2018  . Anxiety 12/26/2017  . Decreased libido 12/26/2017  . Hair loss 12/26/2017  . Chronic bilateral low back pain without sciatica 02/02/2017  . Migraine without aura and without status migrainosus, not intractable 03/05/2016  . ASCUS favor benign 12/10/2015  . History of nipple discharge 12/10/2015  . Constipation 09/08/2014  . Nipple discharge in female 07/15/2014  . Abdominal pain, chronic, epigastric 03/10/2014  . GERD (gastroesophageal reflux disease) 10/11/2013  . Irritable bowel syndrome 10/11/2013  .  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 Molluscum contagiosum 12/18/2012  . Hx of migraines 08/13/2012    Past Surgical History:  Procedure Laterality Date  . NO PAST SURGERIES      OB History    Gravida  1   Para  1   Term  1   Preterm      AB      Living  1     SAB      TAB      Ectopic      Multiple      Live Births  1            Home Medications    Prior to Admission medications   Medication Sig Start Date End Date Taking? Authorizing Provider  amLODipine (NORVASC) 2.5 MG tablet Take 1 tablet (2.5 mg total) by mouth daily. 09/12/18  Yes Cyril Mourning A, NP  busPIRone (BUSPAR) 5 MG tablet TAKE 1 TABLET(5 MG) BY MOUTH THREE TIMES DAILY 07/22/19  Yes Cyril Mourning A, NP  LO LOESTRIN FE 1 MG-10 MCG / 10 MCG tablet TAKE 1 TABLET BY MOUTH DAILY 08/07/19  Yes Cyril Mourning A, NP  topiramate (TOPAMAX) 50 MG tablet Take 1 tablet (50 mg total) by mouth daily. At bedtime 03/28/18  Yes Merlyn Albert, MD  azithromycin (ZITHROMAX Z-PAK) 250 MG tablet Take 2 tablets (500 mg) on  Day 1,  followed by 1 tablet (250 mg) once daily on Days 2 through 5. 08/02/19   Campbell Riches, NP  benzonatate (TESSALON) 100 MG  capsule Take 1 capsule (100 mg total) by mouth every 8 (eight) hours. 08/02/19   Wurst, [place], PA-C  cetirizine (ZYRTEC) 10 MG tablet Take 1 tablet (10 mg total) by mouth daily. 08/02/19   Wurst, [place], PA-C  fluticasone (FLONASE) 50 MCG/ACT nasal spray Place 1 spray into both nostrils daily for 14 days. 06/30/19 08/02/19  Pasty Manninen, Darrelyn Hillock, FNP  HYDROcodone-homatropine (HYCODAN) 5-1.5 MG/5ML syrup Take 5 mLs by mouth every 4 (four) hours as needed. For cough 08/02/19   Nilda Simmer, NP  ketorolac (TORADOL) 10 MG tablet TAKE 1 TABLET BY MOUTH EVERY 6 HOURS AS NEEDED FOR MIGRAINE USE SPARINGLY 04/04/19   Mikey Kirschner, MD  tiZANidine (ZANAFLEX) 4 MG tablet Take 1 tablet (4 mg total) by mouth every 8 (eight) hours as needed for muscle spasms. 03/28/18    Mikey Kirschner, MD  traMADol (ULTRAM) 50 MG tablet Take one tablet po q 6-8 hrs prn pain. Caution:Drowsiness 08/08/19   Mikey Kirschner, MD    Family History Family History  Problem Relation Age of Onset  . Deep vein thrombosis Father   . Hypertension Paternal Grandmother   . Thyroid disease Paternal Grandmother        has had thyroid removed  . Ulcers Mother   . GER disease Mother   . Seizures Daughter   . Hyperthyroidism Daughter   . Other Daughter        meningitis 2 weeks after birth  . Gallbladder disease Neg Hx   . Colon cancer Neg Hx   . Crohn's disease Neg Hx     Social History Social History   Tobacco Use  . Smoking status: Former Smoker    Types: Cigars  . Smokeless tobacco: Never Used  Substance Use Topics  . Alcohol use: No  . Drug use: No     Allergies   5-alpha reductase inhibitors and Penicillins   Review of Systems Review of Systems  Constitutional: Negative.   HENT: Negative.   Respiratory: Positive for chest tightness.   Cardiovascular: Negative.   Gastrointestinal: Negative.   Neurological: Negative.   All other systems reviewed and are negative.    Physical Exam Triage Vital Signs ED Triage Vitals  Enc Vitals Group     BP 08/20/19 1649 129/80     Pulse Rate 08/20/19 1649 92     Resp 08/20/19 1649 18     Temp 08/20/19 1649 98.8 F (37.1 C)     Temp src --      SpO2 08/20/19 1649 94 %     Weight --      Height --      Head Circumference --      Peak Flow --      Pain Score 08/20/19 1647 6     Pain Loc --      Pain Edu? --      Excl. in [place]? --    No data found.  Updated Vital Signs BP 129/80   Pulse 92   Temp 98.8 F (37.1 C)   Resp 18   LMP  (LMP Unknown)   SpO2 94%   Visual Acuity Right Eye Distance:   Left Eye Distance:   Bilateral Distance:    Right Eye Near:   Left Eye Near:    Bilateral Near:     Physical Exam Vitals and nursing note reviewed.  Constitutional:      General: She is not in acute  distress.    Appearance: Normal  appearance. She is normal weight. She is not ill-appearing or toxic-appearing.  HENT:     Head: Normocephalic.     Right Ear: Tympanic membrane, ear canal and external ear normal. There is no impacted cerumen.     Left Ear: Tympanic membrane, ear canal and external ear normal. There is no impacted cerumen.  Cardiovascular:     Rate and Rhythm: Normal rate and regular rhythm.     Pulses: Normal pulses.     Heart sounds: Normal heart sounds. No murmur. No friction rub. No gallop.   Pulmonary:     Effort: Pulmonary effort is normal. No respiratory distress.     Breath sounds: Normal breath sounds. No stridor. No wheezing, rhonchi or rales.  Chest:     Chest wall: No tenderness.  Neurological:     General: No focal deficit present.     Mental Status: She is alert and oriented to person, place, and time.      UC Treatments / Results  Labs (all labs ordered are listed, but only abnormal results are displayed) Labs Reviewed - No data to display  EKG   Radiology DG Chest 2 View  Result Date: 08/20/2019 CLINICAL DATA:  Chest pain.  Recent COVID-19 positive EXAM: CHEST - 2 VIEW COMPARISON:  None. FINDINGS: Lungs are clear. Heart size and pulmonary vascularity are normal. No adenopathy. No bone lesions. IMPRESSION: No abnormality noted. Electronically Signed   By: Bretta Bang III M.D.   On: 08/20/2019 17:17    Procedures Procedures (including critical care time)  Medications Ordered in UC Medications - No data to display  Initial Impression / Assessment and Plan / UC Course  I have reviewed the triage vital signs and the nursing notes.  Pertinent labs & imaging results that were available during my care of the patient were reviewed by me and considered in my medical decision making (see chart for details).   Symptom is more likely from COVID-19 infection.  Less likely from cardiac origin.  Chest x-ray was negative for cardiopulmonary disease.   I have reviewed the x-ray myself and the radiologist interpretation.  I am in agreement with the radiologist interpretation.  Patient was advised to follow-up with PCP To return for new or worsening symptoms    Final Clinical Impressions(s) / UC Diagnoses   Final diagnoses:  History of 2019 novel coronavirus disease (COVID-19)  Chest tightness     Discharge Instructions     Chest x-ray was negative for cardiopulmonary disease Follow-up with PCP Return for new or worsening symptoms    ED Prescriptions    None     PDMP not reviewed this encounter.   Durward Parcel, FNP 08/20/19 1727

## 2019-08-20 NOTE — Discharge Instructions (Addendum)
Chest x-ray was negative for cardiopulmonary disease Follow-up with PCP Return for new or worsening symptoms

## 2019-09-12 ENCOUNTER — Other Ambulatory Visit: Payer: Self-pay | Admitting: Adult Health

## 2019-09-16 DIAGNOSIS — Z23 Encounter for immunization: Secondary | ICD-10-CM | POA: Diagnosis not present

## 2019-11-21 ENCOUNTER — Other Ambulatory Visit: Payer: Self-pay | Admitting: Adult Health

## 2020-01-31 IMAGING — DX DG LUMBAR SPINE COMPLETE 4+V
5 series · 5 of 5 positions shown · non-contrast
Comparison: None.

CLINICAL DATA: Chronic lumbago

EXAM:
LUMBAR SPINE - COMPLETE 4+ VIEW

[l-spine ap]
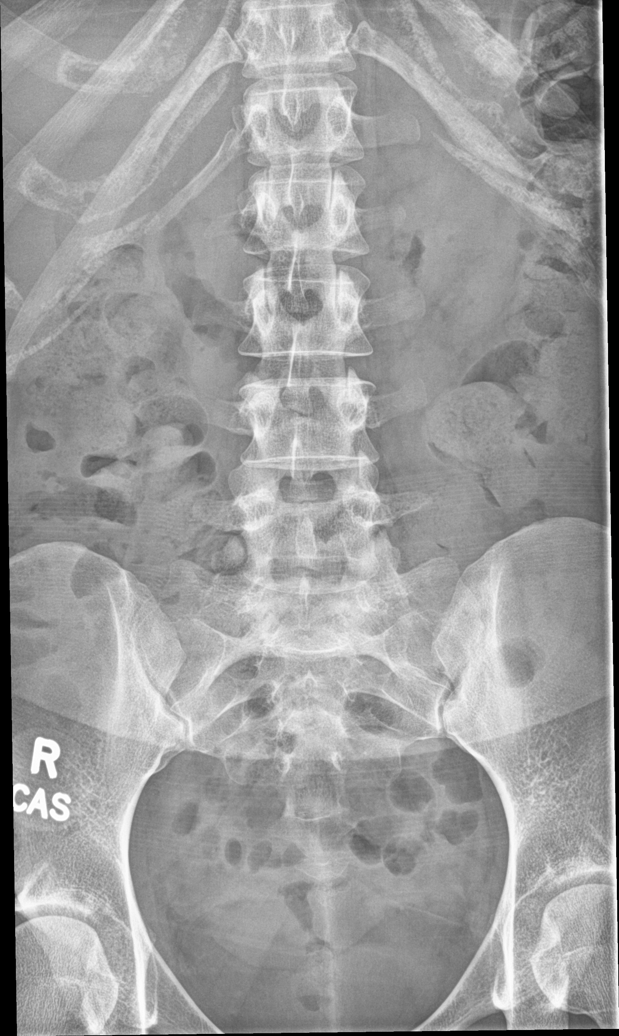

[l-spine obl (1 of 2)]
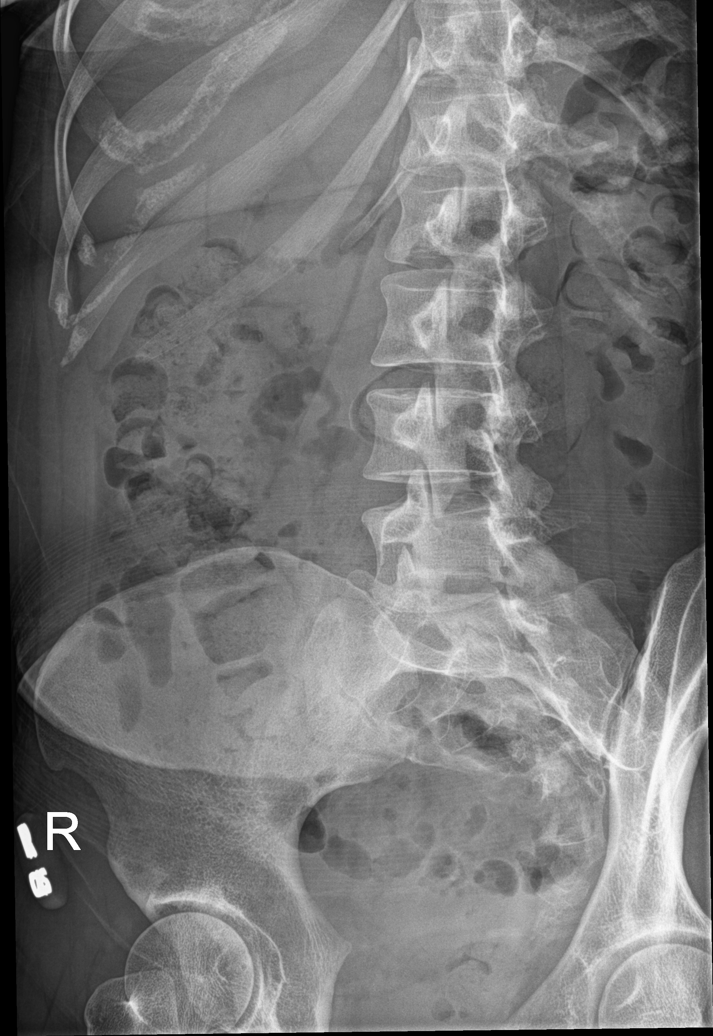

[l-spine obl (2 of 2)]
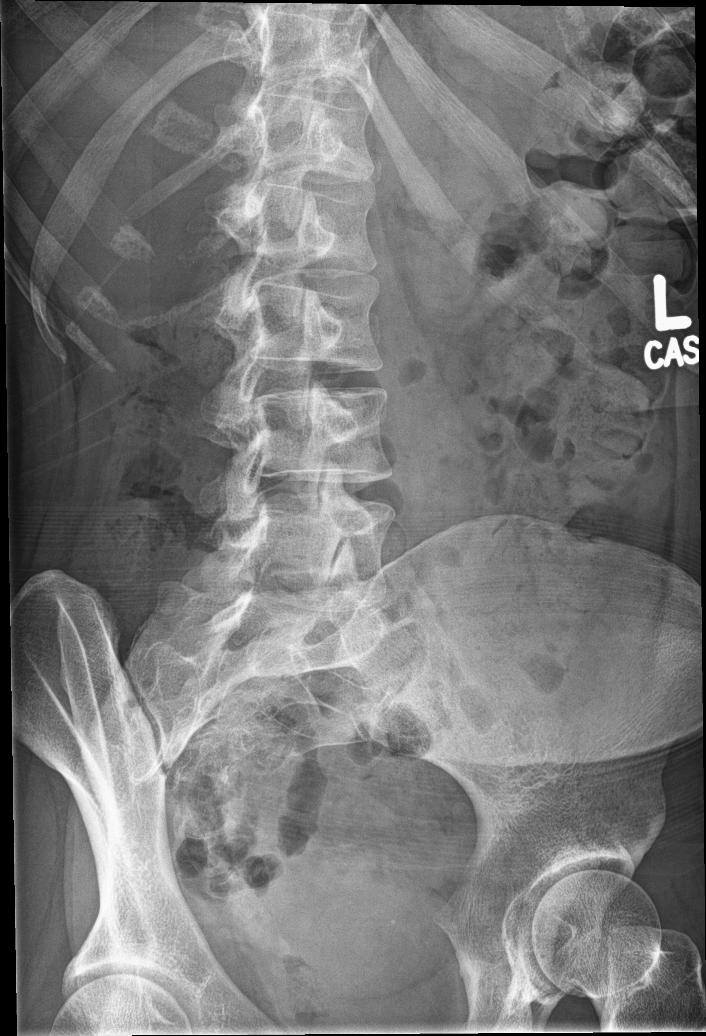

[l-spine lat]
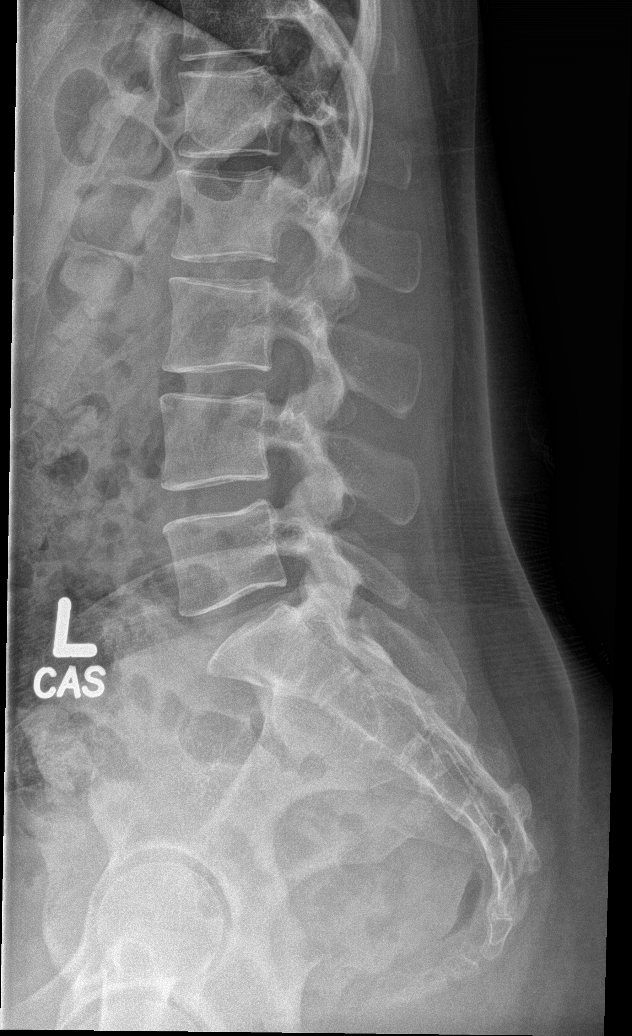

[l-spine spot]
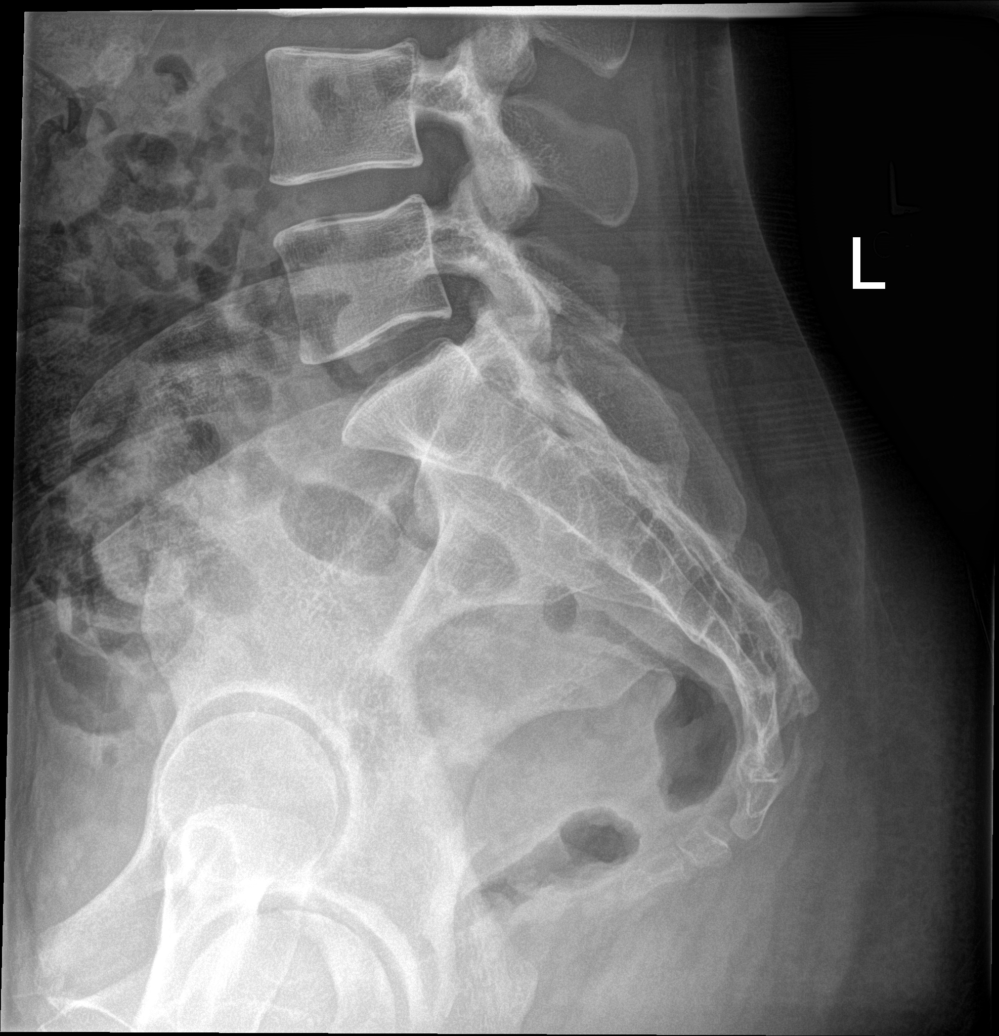

[5 of 5 positions shown; findings below may reference images not displayed]

FINDINGS: Frontal, lateral, spot lumbosacral lateral, and bilateral oblique
views were obtained. There are 4 strictly non-rib-bearing lumbar
type vertebral bodies. There is a rib on the right at L1. There is
no fracture or spondylolisthesis. The disc spaces appear normal.
There is no appreciable facet arthropathy.
IMPRESSION: No fracture or spondylolisthesis.  No evident arthropathy.

## 2020-03-04 ENCOUNTER — Encounter: Payer: Self-pay | Admitting: Emergency Medicine

## 2020-03-04 ENCOUNTER — Ambulatory Visit
Admission: EM | Admit: 2020-03-04 | Discharge: 2020-03-04 | Disposition: A | Discharge status: Home / Self Care | Payer: Medicaid Other | Attending: Emergency Medicine | Admitting: Emergency Medicine

## 2020-03-04 ENCOUNTER — Other Ambulatory Visit: Payer: Self-pay

## 2020-03-04 DIAGNOSIS — Z1152 Encounter for screening for COVID-19: Secondary | ICD-10-CM | POA: Diagnosis not present

## 2020-03-04 NOTE — ED Triage Notes (Signed)
Needs covid test

## 2020-03-05 LAB — NOVEL CORONAVIRUS, NAA: SARS-CoV-2, NAA: NOT DETECTED

## 2020-03-05 LAB — SARS-COV-2, NAA 2 DAY TAT

## 2020-03-09 ENCOUNTER — Other Ambulatory Visit: Payer: Self-pay | Admitting: *Deleted

## 2020-03-09 NOTE — Telephone Encounter (Signed)
Patient scheduled appointment for 11/15

## 2020-03-09 NOTE — Telephone Encounter (Signed)
Refill request from Ketorolac. Please contact pt to schedule appointment and send back to nurses.

## 2020-03-10 ENCOUNTER — Other Ambulatory Visit: Payer: Self-pay | Admitting: Nurse Practitioner

## 2020-03-10 MED ORDER — KETOROLAC TROMETHAMINE 10 MG PO TABS: ORAL_TABLET | ORAL | 5 refills | Status: DC

## 2020-03-15 DIAGNOSIS — H5213 Myopia, bilateral: Secondary | ICD-10-CM | POA: Diagnosis not present

## 2020-04-13 ENCOUNTER — Encounter: Payer: Self-pay | Admitting: Family Medicine

## 2020-04-13 ENCOUNTER — Ambulatory Visit (INDEPENDENT_AMBULATORY_CARE_PROVIDER_SITE_OTHER): Payer: Medicaid Other | Admitting: Family Medicine

## 2020-04-13 ENCOUNTER — Other Ambulatory Visit: Payer: Self-pay

## 2020-04-13 VITALS — BP 132/74 | Temp 98.5°F | Ht 65.0 in | Wt 127.8 lb

## 2020-04-13 DIAGNOSIS — I10 Essential (primary) hypertension: Secondary | ICD-10-CM

## 2020-04-13 DIAGNOSIS — G43009 Migraine without aura, not intractable, without status migrainosus: Secondary | ICD-10-CM

## 2020-04-13 DIAGNOSIS — F419 Anxiety disorder, unspecified: Secondary | ICD-10-CM | POA: Diagnosis not present

## 2020-04-13 DIAGNOSIS — R634 Abnormal weight loss: Secondary | ICD-10-CM | POA: Diagnosis not present

## 2020-04-13 MED ORDER — NARATRIPTAN HCL 2.5 MG PO TABS: 2.5000 mg | ORAL_TABLET | ORAL | 1 refills | Status: DC | PRN

## 2020-04-13 MED ORDER — BUSPIRONE HCL 10 MG PO TABS
10.0000 mg | ORAL_TABLET | Freq: Three times a day (TID) | ORAL | 0 refills | Status: DC
Start: 2020-04-13 — End: 2020-08-07

## 2020-04-13 NOTE — Progress Notes (Signed)
Patient ID: Melanie Gonzales, female    DOB: 1990-07-09, 29 y.o.   MRN: [id number]   Chief Complaint  Patient presents with  . Migraine   Subjective:    HPI Pt seen for f/u on migraines.  Has back pain, but not as often. Pt had covid in 3/21. Also had concern of anxiety and had holter monitor. Saw cardiology. Thought anxiety might be the cause of her chest tightness/discomfort/palpitations. Taking buspar 5mg  tid.  Has h/o panic attacks and lightheaded. Racing heartbeat. And feeling a "super' overwhelming feeling. Then not sure why.  Hasn't taken topamax for past month. Once she started the bp meds and other meds, wanted to stop it.  migraines- one sided, pain behind eyes, nausea, blurry vision, pounding, noise or light sens. Tried propanolol.  maxalt and imitrex written. 2x per week migraines.  cresendo pattern, ever really resolves, then restarts next day.  Has dec in appetite and wondering how to keep weight on.  occ noticing weighting 127-130 lbs then dec down to 120-122.  Noticing she would rather sleep than eat at times.  Not eating full meals.   Medical History Melanie Gonzales has a past medical history of GERD (gastroesophageal reflux disease), History of nipple discharge (12/10/2015), HPV test positive (12/10/2015), migraines (08/13/2012), Hypertension, Migraines, Nipple discharge in female (07/15/2014), and Vaginal Pap smear, abnormal.   Outpatient Encounter Medications as of 04/13/2020  Medication Sig  . amLODipine (NORVASC) 2.5 MG tablet TAKE 1 TABLET(2.5 MG) BY MOUTH DAILY  . busPIRone (BUSPAR) 10 MG tablet Take 1 tablet (10 mg total) by mouth 3 (three) times daily.  04/15/2020 ketorolac (TORADOL) 10 MG tablet TAKE 1 TABLET BY MOUTH EVERY 6 HOURS AS NEEDED FOR MIGRAINE USE SPARINGLY  . LO LOESTRIN FE 1 MG-10 MCG / 10 MCG tablet TAKE 1 TABLET BY MOUTH DAILY  . tiZANidine (ZANAFLEX) 4 MG tablet Take 1 tablet (4 mg total) by mouth every 8 (eight) hours as needed for muscle  spasms.  . [DISCONTINUED] busPIRone (BUSPAR) 5 MG tablet TAKE 1 TABLET(5 MG) BY MOUTH THREE TIMES DAILY  . [DISCONTINUED] topiramate (TOPAMAX) 50 MG tablet Take 1 tablet (50 mg total) by mouth daily. At bedtime  . naratriptan (AMERGE) 2.5 MG tablet Take 1 tablet (2.5 mg total) by mouth as needed for migraine. Take one (1) tablet at onset of headache; if returns or does not resolve, may repeat after 4 hours; do not exceed five (5) mg in 24 hours.  . [DISCONTINUED] azithromycin (ZITHROMAX Z-PAK) 250 MG tablet Take 2 tablets (500 mg) on  Day 1,  followed by 1 tablet (250 mg) once daily on Days 2 through 5.  . [DISCONTINUED] benzonatate (TESSALON) 100 MG capsule Take 1 capsule (100 mg total) by mouth every 8 (eight) hours.  . [DISCONTINUED] cetirizine (ZYRTEC) 10 MG tablet Take 1 tablet (10 mg total) by mouth daily.  . [DISCONTINUED] fluticasone (FLONASE) 50 MCG/ACT nasal spray Place 1 spray into both nostrils daily for 14 days.  . [DISCONTINUED] HYDROcodone-homatropine (HYCODAN) 5-1.5 MG/5ML syrup Take 5 mLs by mouth every 4 (four) hours as needed. For cough  . [DISCONTINUED] traMADol (ULTRAM) 50 MG tablet Take one tablet po q 6-8 hrs prn pain. Caution:Drowsiness   No facility-administered encounter medications on file as of 04/13/2020.     Review of Systems  Constitutional: Positive for appetite change (decreased). Negative for chills and fever.  HENT: Negative for congestion, rhinorrhea and sore throat.   Respiratory: Negative for cough, shortness of breath and wheezing.  Cardiovascular: Negative for chest pain and leg swelling.  Gastrointestinal: Negative for abdominal pain, diarrhea, nausea and vomiting.  Genitourinary: Negative for dysuria and frequency.  Musculoskeletal: Negative for arthralgias and back pain.  Skin: Negative for rash.  Neurological: Positive for headaches (migraines). Negative for dizziness and weakness.  Psychiatric/Behavioral: Negative for agitation, decreased  concentration, dysphoric mood and sleep disturbance. The patient is nervous/anxious. The patient is not hyperactive.      Vitals BP 132/74   Temp 98.5 F (36.9 C)   Ht 5\' 5"  (1.651 m)   Wt 127 lb 12.8 oz (58 kg)   BMI 21.27 kg/m   Objective:   Physical Exam Vitals and nursing note reviewed.  Constitutional:      General: She is not in acute distress.    Appearance: Normal appearance. She is not ill-appearing.  HENT:     Head: Normocephalic and atraumatic.     Nose: Nose normal.     Mouth/Throat:     Mouth: Mucous membranes are moist.     Pharynx: Oropharynx is clear.  Eyes:     Extraocular Movements: Extraocular movements intact.     Conjunctiva/sclera: Conjunctivae normal.     Pupils: Pupils are equal, round, and reactive to light.  Cardiovascular:     Rate and Rhythm: Normal rate and regular rhythm.     Pulses: Normal pulses.     Heart sounds: Normal heart sounds.  Pulmonary:     Effort: Pulmonary effort is normal.     Breath sounds: Normal breath sounds. No wheezing, rhonchi or rales.  Musculoskeletal:        General: Normal range of motion.     Right lower leg: No edema.     Left lower leg: No edema.  Skin:    General: Skin is warm and dry.     Findings: No lesion or rash.  Neurological:     General: No focal deficit present.     Mental Status: She is alert and oriented to person, place, and time.     Cranial Nerves: No cranial nerve deficit.  Psychiatric:        Mood and Affect: Mood normal.        Behavior: Behavior normal.        Thought Content: Thought content normal.        Judgment: Judgment normal.      Assessment and Plan   1. Migraine without aura and without status migrainosus, not intractable  2. Anxiety  3. Primary hypertension  4. Weight loss, unintentional   Migraine-not controlled. Trial of naratriptan and inc buspar for her anxiety from 5mg  to 10mg  tid. May need to start an SSRI if not improving with anxiety.  Cont with toradol  for headaches.  Unintentional weight loss- cont to monitor. Inc calorie intake if feeling she can't keep weight on.  htn- stable, cont meds.  F/u 72mo or prn.

## 2020-04-13 NOTE — Progress Notes (Signed)
Pt here for medication follow up. Pt states she is still having migraines. Pt would like a new script for Zanaflex. Pt feels that Topamax is not working. Pt has noticed that she will have a headache when blood pressure is elevated.     Patient ID: Melanie Gonzales, female    DOB: 1991-04-20, 29 y.o.   MRN: [id number]   Chief Complaint  Patient presents with  . Migraine   Subjective:    HPI   Medical History Melanie Gonzales has a past medical history of GERD (gastroesophageal reflux disease), History of nipple discharge (12/10/2015), HPV test positive (12/10/2015), migraines (08/13/2012), Hypertension, Migraines, Nipple discharge in female (07/15/2014), and Vaginal Pap smear, abnormal.   Outpatient Encounter Medications as of 04/13/2020  Medication Sig  . amLODipine (NORVASC) 2.5 MG tablet TAKE 1 TABLET(2.5 MG) BY MOUTH DAILY  . busPIRone (BUSPAR) 5 MG tablet TAKE 1 TABLET(5 MG) BY MOUTH THREE TIMES DAILY  . ketorolac (TORADOL) 10 MG tablet TAKE 1 TABLET BY MOUTH EVERY 6 HOURS AS NEEDED FOR MIGRAINE USE SPARINGLY  . LO LOESTRIN FE 1 MG-10 MCG / 10 MCG tablet TAKE 1 TABLET BY MOUTH DAILY  . tiZANidine (ZANAFLEX) 4 MG tablet Take 1 tablet (4 mg total) by mouth every 8 (eight) hours as needed for muscle spasms.  Marland Kitchen topiramate (TOPAMAX) 50 MG tablet Take 1 tablet (50 mg total) by mouth daily. At bedtime  . [DISCONTINUED] azithromycin (ZITHROMAX Z-PAK) 250 MG tablet Take 2 tablets (500 mg) on  Day 1,  followed by 1 tablet (250 mg) once daily on Days 2 through 5.  . [DISCONTINUED] benzonatate (TESSALON) 100 MG capsule Take 1 capsule (100 mg total) by mouth every 8 (eight) hours.  . [DISCONTINUED] cetirizine (ZYRTEC) 10 MG tablet Take 1 tablet (10 mg total) by mouth daily.  . [DISCONTINUED] fluticasone (FLONASE) 50 MCG/ACT nasal spray Place 1 spray into both nostrils daily for 14 days.  . [DISCONTINUED] HYDROcodone-homatropine (HYCODAN) 5-1.5 MG/5ML syrup Take 5 mLs by mouth every 4 (four) hours as  needed. For cough  . [DISCONTINUED] traMADol (ULTRAM) 50 MG tablet Take one tablet po q 6-8 hrs prn pain. Caution:Drowsiness   No facility-administered encounter medications on file as of 04/13/2020.     Review of Systems   Vitals There were no vitals taken for this visit.  Objective:   Physical Exam   Assessment and Plan   There are no diagnoses linked to this encounter.     Marlowe Shores, LPN [id number]

## 2020-05-12 ENCOUNTER — Other Ambulatory Visit: Payer: Self-pay | Admitting: Adult Health

## 2020-06-17 ENCOUNTER — Telehealth: Payer: Self-pay | Admitting: *Deleted

## 2020-06-17 MED ORDER — RIZATRIPTAN BENZOATE 10 MG PO TBDP: 10.0000 mg | ORAL_TABLET | ORAL | 0 refills | Status: DC | PRN

## 2020-06-17 NOTE — Telephone Encounter (Signed)
Patient states she will try the maxalt

## 2020-06-17 NOTE — Telephone Encounter (Signed)
Patient aware.

## 2020-06-17 NOTE — Telephone Encounter (Signed)
Pt stating she tried imitrex and maxalt in past and didn't work for her.  Can we do pre-auth? Thx. Dr. Ladona Ridgel

## 2020-06-17 NOTE — Telephone Encounter (Signed)
Ask pt which one she wants to try again, had both in chart in past, imitrex 50mg  or maxalt 10mg  odt.   Thx.   Dr. 

## 2020-06-17 NOTE — Telephone Encounter (Signed)
Medicaid does not cover Naratriptan 2.5 mg tabs- they cover imitrex and maxalt. Please advise

## 2020-06-17 NOTE — Telephone Encounter (Signed)
The form stated that this is not a medication that is covered by her plan and stated we would need to send in a different medication for consideration - listed imitrex and maxalt as preferred medications

## 2020-07-07 ENCOUNTER — Other Ambulatory Visit: Payer: Self-pay | Admitting: Adult Health

## 2020-08-05 ENCOUNTER — Other Ambulatory Visit: Payer: Self-pay | Admitting: Family Medicine

## 2020-10-12 ENCOUNTER — Other Ambulatory Visit: Payer: Self-pay

## 2020-10-12 ENCOUNTER — Other Ambulatory Visit (HOSPITAL_COMMUNITY)
Admission: RE | Admit: 2020-10-12 | Discharge: 2020-10-12 | Disposition: A | Discharge status: Home / Self Care | Payer: Medicaid Other | Source: Ambulatory Visit | Attending: Obstetrics & Gynecology | Admitting: Obstetrics & Gynecology

## 2020-10-12 ENCOUNTER — Other Ambulatory Visit (INDEPENDENT_AMBULATORY_CARE_PROVIDER_SITE_OTHER): Payer: Medicaid Other | Admitting: *Deleted

## 2020-10-12 ENCOUNTER — Ambulatory Visit: Payer: Medicaid Other | Admitting: Family Medicine

## 2020-10-12 DIAGNOSIS — Z113 Encounter for screening for infections with a predominantly sexual mode of transmission: Secondary | ICD-10-CM

## 2020-10-12 NOTE — Progress Notes (Signed)
   NURSE VISIT- STD  SUBJECTIVE:  Melanie Gonzales is a 30 y.o. G1P1001 GYN patientfemale here for a vaginal swab for STD screen.  She reports the following symptoms: none for 0 days. Denies abnormal vaginal bleeding, significant pelvic pain, fever, or UTI symptoms.  OBJECTIVE:  There were no vitals taken for this visit.  Appears well, in no apparent distress  ASSESSMENT: Vaginal swab for STD screen  PLAN: Self-collected vaginal probe for Gonorrhea, Chlamydia, Trichomonas, Bacterial Vaginosis, Yeast sent to lab Treatment: to be determined once results are received Follow-up as needed if symptoms persist/worsen, or new symptoms develop  [name]  10/12/2020 2:18 PM

## 2020-10-12 NOTE — Progress Notes (Signed)
Chart reviewed for nurse visit. Agree with plan of care.  Adline Potter, NP 10/12/2020 5:10 PM

## 2020-10-14 LAB — CERVICOVAGINAL ANCILLARY ONLY
Bacterial Vaginitis (gardnerella): NEGATIVE
Candida Glabrata: NEGATIVE
Candida Vaginitis: NEGATIVE
Chlamydia: NEGATIVE
Comment: NEGATIVE
Comment: NEGATIVE
Comment: NEGATIVE
Comment: NEGATIVE
Comment: NEGATIVE
Comment: NORMAL
Neisseria Gonorrhea: NEGATIVE
Trichomonas: NEGATIVE

## 2020-10-16 ENCOUNTER — Ambulatory Visit: Payer: Medicaid Other | Admitting: Family Medicine

## 2020-11-17 ENCOUNTER — Other Ambulatory Visit: Payer: Medicaid Other | Admitting: Adult Health

## 2020-11-19 ENCOUNTER — Ambulatory Visit (INDEPENDENT_AMBULATORY_CARE_PROVIDER_SITE_OTHER): Payer: Medicaid Other | Admitting: Adult Health

## 2020-11-19 ENCOUNTER — Other Ambulatory Visit (HOSPITAL_COMMUNITY)
Admission: RE | Admit: 2020-11-19 | Discharge: 2020-11-19 | Disposition: A | Discharge status: Home / Self Care | Payer: Medicaid Other | Source: Ambulatory Visit | Attending: Adult Health | Admitting: Adult Health

## 2020-11-19 ENCOUNTER — Encounter: Payer: Self-pay | Admitting: Adult Health

## 2020-11-19 ENCOUNTER — Other Ambulatory Visit: Payer: Self-pay

## 2020-11-19 VITALS — BP 136/90 | HR 86 | Ht 66.0 in | Wt 136.0 lb

## 2020-11-19 DIAGNOSIS — Z3041 Encounter for surveillance of contraceptive pills: Secondary | ICD-10-CM | POA: Diagnosis not present

## 2020-11-19 DIAGNOSIS — I10 Essential (primary) hypertension: Secondary | ICD-10-CM | POA: Insufficient documentation

## 2020-11-19 DIAGNOSIS — Z01419 Encounter for gynecological examination (general) (routine) without abnormal findings: Secondary | ICD-10-CM | POA: Insufficient documentation

## 2020-11-19 MED ORDER — AMLODIPINE BESYLATE 2.5 MG PO TABS: ORAL_TABLET | ORAL | 3 refills | Status: DC

## 2020-11-19 MED ORDER — LO LOESTRIN FE 1 MG-10 MCG / 10 MCG PO TABS: 1.0000 | ORAL_TABLET | Freq: Every day | ORAL | 12 refills | Status: DC

## 2020-11-19 NOTE — Progress Notes (Signed)
Patient ID: Melanie Gonzales, female   DOB: 02/12/1991, 30 y.o.   MRN: [id number] History of Present Illness:  Melanie Gonzales is a 30 year old black female,single, G1P1 in for well woman gyn exam and pap. She says partner has HPV, had wart.  PCP is Dr Ladona Ridgel.  Current Medications, Allergies, Past Medical History, Past Surgical History, Family History and Social History were reviewed in [name] record.     Review of Systems: Patient denies any daily headaches, hearing loss, fatigue, blurred vision, shortness of breath, chest pain, abdominal pain, problems with bowel movements, urination, or intercourse. No joint pain or mood swings.  Periods lite with lo loestrin    Physical Exam:BP 136/90 (BP Location: Right Arm, Patient Position: Sitting, Cuff Size: Normal)   Pulse 86   Ht 5\' 6"  (1.676 m)   Wt 136 lb (61.7 kg)   LMP 10/29/2020 (Approximate)   BMI 21.95 kg/m   General:  Well developed, well nourished, no acute distress Skin:  Warm and dry Neck:  Midline trachea, normal thyroid, good ROM, no lymphadenopathy Lungs; Clear to auscultation bilaterally Breast:  No dominant palpable mass, retraction, or nipple discharge Cardiovascular: Regular rate and rhythm Abdomen:  Soft, non tender, no hepatosplenomegaly Pelvic:  External genitalia is normal in appearance, no lesions.  The vagina is normal in appearance. Urethra has no lesions or masses. The cervix is bulbous.Ha several nabothian cysts, and friable with EC brush, pap with HR HPV genotyping performed.  Uterus is felt to be normal size, shape, and contour.  No adnexal masses or tenderness noted.Bladder is non tender, no masses felt. Extremities/musculoskeletal:  No swelling or varicosities noted, no clubbing or cyanosis Psych:  No mood changes, alert and cooperative,seems happy AA is 2 Fall risk is low Depression screen [facility] 2/9 11/19/2020 04/13/2020 09/12/2018  Decreased Interest 0 0 0  Down, Depressed, Hopeless 0 0  0  PHQ - 2 Score 0 0 0  Altered sleeping 0 - -  Tired, decreased energy 3 - -  Change in appetite 0 - -  Feeling bad or failure about yourself  1 - -  Trouble concentrating 0 - -  Moving slowly or fidgety/restless 0 - -  Suicidal thoughts 0 - -  PHQ-9 Score 4 - -    GAD 7 : Generalized Anxiety Score 11/19/2020  Nervous, Anxious, on Edge 1  Control/stop worrying 0  Worry too much - different things 1  Trouble relaxing 0  Restless 0  Easily annoyed or irritable 0  Afraid - awful might happen 0  Total GAD 7 Score 2    On buspar  [facility] - 11/19/20 1332       Pregnancy Intention Screening   Does the patient want to become pregnant in the next year? Yes    Does the patient's partner want to become pregnant in the next year? Yes    Would the patient like to discuss contraceptive options today? No      Contraception Wrap Up   Current Method Oral Contraceptive    End Method Oral Contraceptive    Contraception Counseling Provided No            Examination chaperoned by 11/21/20.   Impression and Plan: 1. Encounter for gynecological examination with Papanicolaou smear of cervix Pap sent Physical in 1 year Pap in 3 if normal Will check labs  - Cytology - PAP( [place]) - CBC - Comprehensive metabolic panel - TSH - Lipid panel  2. Encounter  for surveillance of contraceptive pills Continue lo loestrin  Meds ordered this encounter  Medications   amLODipine (NORVASC) 2.5 MG tablet    Sig: TAKE 1 TABLET(2.5 MG) BY MOUTH DAILY    Dispense:  90 tablet    Refill:  3    Order Specific Question:   Supervising Provider    Answer:   Duane Lope H [[id number]]   Norethindrone-Ethinyl Estradiol-Fe Biphas (LO LOESTRIN FE) 1 MG-10 MCG / 10 MCG tablet    Sig: Take 1 tablet by mouth daily.    Dispense:  28 tablet    Refill:  12    Order Specific Question:   Supervising Provider    Answer:   Despina Hidden, LUTHER H [[id number]]     3. Hypertension, unspecified type Continue norvasc

## 2020-11-20 LAB — CBC
Hematocrit: 36.6 % (ref 34.0–46.6)
Hemoglobin: 12.3 g/dL (ref 11.1–15.9)
MCH: 31.6 pg (ref 26.6–33.0)
MCHC: 33.6 g/dL (ref 31.5–35.7)
MCV: 94 fL (ref 79–97)
Platelets: 361 10*3/uL (ref 150–450)
RBC: 3.89 x10E6/uL (ref 3.77–5.28)
RDW: 11.9 % (ref 11.7–15.4)
WBC: 4.2 10*3/uL (ref 3.4–10.8)

## 2020-11-20 LAB — COMPREHENSIVE METABOLIC PANEL
ALT: 7 IU/L (ref 0–32)
AST: 13 IU/L (ref 0–40)
Albumin/Globulin Ratio: 1.5 (ref 1.2–2.2)
Albumin: 4.3 g/dL (ref 3.9–5.0)
Alkaline Phosphatase: 52 IU/L (ref 44–121)
BUN/Creatinine Ratio: 16 (ref 9–23)
BUN: 11 mg/dL (ref 6–20)
Bilirubin Total: 0.3 mg/dL (ref 0.0–1.2)
CO2: 23 mmol/L (ref 20–29)
Calcium: 9.2 mg/dL (ref 8.7–10.2)
Chloride: 102 mmol/L (ref 96–106)
Creatinine, Ser: 0.7 mg/dL (ref 0.57–1.00)
Globulin, Total: 2.9 g/dL (ref 1.5–4.5)
Glucose: 69 mg/dL (ref 65–99)
Potassium: 4.2 mmol/L (ref 3.5–5.2)
Sodium: 138 mmol/L (ref 134–144)
Total Protein: 7.2 g/dL (ref 6.0–8.5)
eGFR: 121 mL/min/{1.73_m2} (ref >59)

## 2020-11-20 LAB — LIPID PANEL
Chol/HDL Ratio: 3 ratio (ref 0.0–4.4)
Cholesterol, Total: 225 mg/dL — ABNORMAL HIGH (ref 100–199)
HDL: 74 mg/dL (ref >39)
LDL Chol Calc (NIH): 139 mg/dL — ABNORMAL HIGH (ref 0–99)
Triglycerides: 67 mg/dL (ref 0–149)
VLDL Cholesterol Cal: 12 mg/dL (ref 5–40)

## 2020-11-20 LAB — TSH: TSH: 1.4 u[IU]/mL (ref 0.450–4.500)

## 2020-11-23 LAB — CYTOLOGY - PAP
Comment: NEGATIVE
Diagnosis: NEGATIVE
High risk HPV: NEGATIVE

## 2021-01-20 ENCOUNTER — Other Ambulatory Visit: Payer: Self-pay | Admitting: Nurse Practitioner

## 2021-01-20 NOTE — Telephone Encounter (Signed)
Please contact patient to have her set up appt. Then may route back to nurses. Thank you 

## 2021-01-20 NOTE — Telephone Encounter (Signed)
Sent mychart message

## 2021-03-10 MED ORDER — KETOROLAC TROMETHAMINE 10 MG PO TABS: ORAL_TABLET | ORAL | 0 refills | Status: DC

## 2021-03-10 NOTE — Addendum Note (Signed)
Addended by: Alm Bustard R on: 03/10/2021 11:16 AM   Modules accepted: Orders

## 2021-03-10 NOTE — Telephone Encounter (Signed)
Sent second my chart 10/12

## 2021-03-17 ENCOUNTER — Encounter: Payer: Self-pay | Admitting: *Deleted

## 2021-03-17 ENCOUNTER — Encounter: Payer: Self-pay | Admitting: Adult Health

## 2021-03-17 ENCOUNTER — Ambulatory Visit (INDEPENDENT_AMBULATORY_CARE_PROVIDER_SITE_OTHER): Payer: Medicaid Other | Admitting: *Deleted

## 2021-03-17 ENCOUNTER — Other Ambulatory Visit: Payer: Self-pay

## 2021-03-17 VITALS — BP 127/86 | HR 91 | Ht 66.0 in | Wt 145.5 lb

## 2021-03-17 DIAGNOSIS — Z3201 Encounter for pregnancy test, result positive: Secondary | ICD-10-CM | POA: Diagnosis not present

## 2021-03-17 LAB — POCT URINE PREGNANCY: Preg Test, Ur: POSITIVE — AB

## 2021-03-17 NOTE — Progress Notes (Signed)
Chart reviewed for nurse visit. Agree with plan of care.  Adline Potter, NP 03/17/2021 5:03 PM

## 2021-03-17 NOTE — Progress Notes (Signed)
   NURSE VISIT- PREGNANCY CONFIRMATION   SUBJECTIVE:  Melanie Gonzales is a 30 y.o. G2P1001 female at Unknown by uncertain LMP of No LMP recorded (lmp unknown). Patient is pregnant. Here for pregnancy confirmation.  Home pregnancy test: positive x 4.   She reports  nausea and cramping .  She is taking prenatal vitamins. Pt was advised to stop Toradol and Maxalt.    OBJECTIVE:  BP 127/86 (BP Location: Left Arm, Patient Position: Sitting, Cuff Size: Normal)   Pulse 91   Ht 5\' 6"  (1.676 m)   Wt 145 lb 8 oz (66 kg)   LMP  (LMP Unknown)   BMI 23.48 kg/m   Appears well, in no apparent distress  Results for orders placed or performed in visit on 03/17/21 (from the past 24 hour(s))  POCT urine pregnancy   Collection Time: 03/17/21 10:11 AM  Result Value Ref Range   Preg Test, Ur Positive (A) Negative    ASSESSMENT: Positive pregnancy test, Unknown by LMP    PLAN: Schedule for dating ultrasound in   will wait for quant results.  Prenatal vitamins: continue   Nausea medicines: not currently needed   OB packet given: Yes  03/19/21  03/17/2021 10:24 AM

## 2021-03-18 LAB — BETA HCG QUANT (REF LAB): hCG Quant: 5973 m[IU]/mL

## 2021-04-02 ENCOUNTER — Other Ambulatory Visit: Payer: Self-pay | Admitting: Adult Health

## 2021-04-02 MED ORDER — PROMETHAZINE HCL 25 MG PO TABS: 25.0000 mg | ORAL_TABLET | Freq: Four times a day (QID) | ORAL | 1 refills | Status: DC | PRN

## 2021-04-02 NOTE — Progress Notes (Signed)
Will rx phenergan for nausea  ?

## 2021-04-06 ENCOUNTER — Other Ambulatory Visit: Payer: Self-pay

## 2021-04-06 ENCOUNTER — Other Ambulatory Visit: Payer: Self-pay | Admitting: Adult Health

## 2021-04-06 ENCOUNTER — Ambulatory Visit (INDEPENDENT_AMBULATORY_CARE_PROVIDER_SITE_OTHER): Payer: Medicaid Other

## 2021-04-06 ENCOUNTER — Encounter: Payer: Self-pay | Admitting: Women's Health

## 2021-04-06 DIAGNOSIS — O3680X0 Pregnancy with inconclusive fetal viability, not applicable or unspecified: Secondary | ICD-10-CM

## 2021-04-06 DIAGNOSIS — Z3A08 8 weeks gestation of pregnancy: Secondary | ICD-10-CM

## 2021-04-06 NOTE — Progress Notes (Signed)
[place] 8 wks,single IUP with YS,positive FHT 167 bpm,CRL 16.88 mm,normal right ovary,simple left corpus luteal cyst 3 x 2.7 x 2.6 cm

## 2021-04-12 ENCOUNTER — Other Ambulatory Visit: Payer: Medicaid Other

## 2021-04-13 ENCOUNTER — Encounter: Payer: Self-pay | Admitting: Family Medicine

## 2021-04-13 ENCOUNTER — Ambulatory Visit: Payer: Medicaid Other | Admitting: Family Medicine

## 2021-04-13 ENCOUNTER — Other Ambulatory Visit: Payer: Self-pay

## 2021-04-13 VITALS — BP 130/80 | Ht 66.0 in | Wt 142.2 lb

## 2021-04-13 DIAGNOSIS — R110 Nausea: Secondary | ICD-10-CM | POA: Diagnosis not present

## 2021-04-13 DIAGNOSIS — G43009 Migraine without aura, not intractable, without status migrainosus: Secondary | ICD-10-CM | POA: Diagnosis not present

## 2021-04-13 DIAGNOSIS — I10 Essential (primary) hypertension: Secondary | ICD-10-CM | POA: Diagnosis not present

## 2021-04-13 MED ORDER — ONDANSETRON HCL 4 MG PO TABS: 4.0000 mg | ORAL_TABLET | Freq: Three times a day (TID) | ORAL | 0 refills | Status: DC | PRN

## 2021-04-13 MED ORDER — DOXYLAMINE-PYRIDOXINE 10-10 MG PO TBEC: DELAYED_RELEASE_TABLET | ORAL | 3 refills | Status: DC

## 2021-04-13 NOTE — Patient Instructions (Addendum)
Continue your current medications.   Follow up every 6 months to 1 year.  Tylenol as needed for headache. Diclegis as directed.  Take care  Dr. Adriana Simas

## 2021-04-13 NOTE — Assessment & Plan Note (Signed)
BP is stable/well-controlled at this time.  She is on amlodipine.  I discussed this with OB/GYN NP, Cyril Mourning.  We will continue amlodipine at this time.  She has scheduled follow-up with OB/GYN.

## 2021-04-13 NOTE — Assessment & Plan Note (Signed)
Nausea in pregnancy.  Starting on Diclegis.  Zofran if needed.  Patient also has Phenergan to be used if needed, per OB/GYN.

## 2021-04-13 NOTE — Assessment & Plan Note (Signed)
Advised to stay hydrated.  Tylenol as needed.  She can use antiemetics as well which may help with migraines.  Supportive care.

## 2021-04-13 NOTE — Progress Notes (Signed)
Subjective:  Patient ID: Melanie Gonzales, female    DOB: 1990-08-21  Age: 30 y.o. MRN: [id number]  CC: Chief Complaint  Patient presents with   Migraine    Follow up Establish care Patient is [redacted] weeks pregnant and had to stop her medications    HPI:  30 year old female with a history of migraine and hypertension presents for follow-up and to establish care with me.  Patient is recently found out that she is pregnant.  She has hypertension.  She is currently on amlodipine and BP is well controlled at 130/80.  She is experiencing nausea associated with pregnancy.  Using Phenergan 5 out of 7 days of the week.  She states that she is compliant with her prenatal.  Patient is concerned about migraine headaches specially given the fact that she is pregnant.  She is currently using Tylenol only.  Last migraine was last week.  Currently doing well other than nausea.  No other complaints or concerns at this time.  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    Social Hx   Social History   Socioeconomic History   Marital status: Single    Spouse name: Not on file   Number of children: 1   Years of education: Not on file   Highest education level: Not on file  Occupational History   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    Employer: [employer]  Tobacco Use   Smoking status: Former    Types: Cigars   Smokeless tobacco: Never  Building services engineer Use: Never used  Substance and Sexual Activity    Alcohol use: Not Currently    Comment: occ   Drug use: No   Sexual activity: Yes    Birth control/protection: None  Other Topics Concern   Not on file  Social History Narrative   Not on file   Social Determinants of Health   Financial Resource Strain: Low Risk    Difficulty of Paying Living Expenses: Not very hard  Food Insecurity: No Food Insecurity   Worried About Programme researcher, broadcasting/film/video in the Last Year: Never true   Ran Out of Food in the Last Year: Never true  Transportation Needs: No Transportation Needs   Lack of Transportation (Medical): No   Lack of Transportation (Non-Medical): No  Physical Activity: Inactive   Days of Exercise per Week: 0 days   Minutes of Exercise per Session: 0 min  Stress: Stress Concern Present   Feeling of Stress : To some extent  Social Connections: Socially Isolated   Frequency of Communication with Friends and Family: Three times a week   Frequency of Social Gatherings with Friends and Family: Never   Attends Religious Services: Never   Database administrator or Organizations: No   Attends Banker Meetings: Never   Marital Status: Never married    Review of Systems  Gastrointestinal:  Positive for nausea.  Neurological:  Positive for headaches.    Objective:  BP 130/80   Ht 5\' 6"  (1.676 m)  Wt 142 lb 3.2 oz (64.5 kg)   LMP  (LMP Unknown)   BMI 22.95 kg/m   BP/Weight 04/13/2021 03/17/2021 11/19/2020  Systolic BP 130 127 136  Diastolic BP 80 86 90  Wt. (Lbs) 142.2 145.5 136  BMI 22.95 23.48 21.95    Physical Exam Vitals and nursing note reviewed.  Constitutional:      General: She is not in acute distress.    Appearance: Normal appearance.  HENT:     Head: Normocephalic and atraumatic.  Eyes:     General:        Right eye: No discharge.        Left eye: No discharge.     Conjunctiva/sclera: Conjunctivae normal.  Cardiovascular:     Rate and Rhythm: Normal rate and regular rhythm.  Pulmonary:     Effort:  Pulmonary effort is normal.     Breath sounds: Normal breath sounds.  Abdominal:     General: There is no distension.     Palpations: Abdomen is soft.     Tenderness: There is no abdominal tenderness.  Neurological:     Mental Status: She is alert.  Psychiatric:        Mood and Affect: Mood normal.        Behavior: Behavior normal.    Lab Results  Component Value Date   WBC 4.2 11/19/2020   HGB 12.3 11/19/2020   HCT 36.6 11/19/2020   PLT 361 11/19/2020   GLUCOSE 69 11/19/2020   CHOL 225 (H) 11/19/2020   TRIG 67 11/19/2020   HDL 74 11/19/2020   LDLCALC 139 (H) 11/19/2020   ALT 7 11/19/2020   AST 13 11/19/2020   NA 138 11/19/2020   K 4.2 11/19/2020   CL 102 11/19/2020   CREATININE 0.70 11/19/2020   BUN 11 11/19/2020   CO2 23 11/19/2020   TSH 1.400 11/19/2020     Assessment & Plan:   Problem List Items Addressed This Visit       Cardiovascular and Mediastinum   Migraine without aura and without status migrainosus, not intractable    Advised to stay hydrated.  Tylenol as needed.  She can use antiemetics as well which may help with migraines.  Supportive care.      Essential hypertension - Primary    BP is stable/well-controlled at this time.  She is on amlodipine.  I discussed this with OB/GYN NP, Cyril Mourning.  We will continue amlodipine at this time.  She has scheduled follow-up with OB/GYN.        Other   Nausea    Nausea in pregnancy.  Starting on Diclegis.  Zofran if needed.  Patient also has Phenergan to be used if needed, per OB/GYN.       Meds ordered this encounter  Medications   Doxylamine-Pyridoxine 10-10 MG TBEC    Sig: 2 at bedtime on day 1 & 2; if symptoms persist, take 1 tablet in am and 2 tablets at bedtime on day 3; if symptoms persist, may increase to 1 tablet in am, 1 tablet afternoon, and 2 tablets at bedtime on day 4    Dispense:  60 tablet    Refill:  3   ondansetron (ZOFRAN) 4 MG tablet    Sig: Take 1 tablet (4 mg total) by  mouth every 8 (eight) hours as needed for nausea or vomiting.    Dispense:  20 tablet    Refill:  0    Follow-up:  6 months to 1  year or sooner if needed  Everlene Other DO [facility]

## 2021-04-14 ENCOUNTER — Encounter: Payer: Self-pay | Admitting: Family Medicine

## 2021-05-04 ENCOUNTER — Encounter: Payer: Self-pay | Admitting: Women's Health

## 2021-05-04 ENCOUNTER — Other Ambulatory Visit: Payer: Self-pay | Admitting: Obstetrics & Gynecology

## 2021-05-04 DIAGNOSIS — O0990 Supervision of high risk pregnancy, unspecified, unspecified trimester: Secondary | ICD-10-CM | POA: Insufficient documentation

## 2021-05-04 DIAGNOSIS — Z3682 Encounter for antenatal screening for nuchal translucency: Secondary | ICD-10-CM

## 2021-05-05 ENCOUNTER — Encounter: Payer: Self-pay | Admitting: Women's Health

## 2021-05-05 ENCOUNTER — Ambulatory Visit: Payer: Medicaid Other | Admitting: *Deleted

## 2021-05-05 ENCOUNTER — Ambulatory Visit (INDEPENDENT_AMBULATORY_CARE_PROVIDER_SITE_OTHER): Payer: Medicaid Other | Admitting: Women's Health

## 2021-05-05 ENCOUNTER — Other Ambulatory Visit: Payer: Self-pay

## 2021-05-05 ENCOUNTER — Ambulatory Visit (INDEPENDENT_AMBULATORY_CARE_PROVIDER_SITE_OTHER): Payer: Medicaid Other

## 2021-05-05 VITALS — BP 115/80 | HR 79 | Wt 138.0 lb

## 2021-05-05 DIAGNOSIS — O0991 Supervision of high risk pregnancy, unspecified, first trimester: Secondary | ICD-10-CM | POA: Diagnosis not present

## 2021-05-05 DIAGNOSIS — I10 Essential (primary) hypertension: Secondary | ICD-10-CM

## 2021-05-05 DIAGNOSIS — O10919 Unspecified pre-existing hypertension complicating pregnancy, unspecified trimester: Secondary | ICD-10-CM | POA: Diagnosis not present

## 2021-05-05 DIAGNOSIS — Z3A12 12 weeks gestation of pregnancy: Secondary | ICD-10-CM

## 2021-05-05 DIAGNOSIS — O1490 Unspecified pre-eclampsia, unspecified trimester: Secondary | ICD-10-CM | POA: Insufficient documentation

## 2021-05-05 DIAGNOSIS — Z31430 Encounter of female for testing for genetic disease carrier status for procreative management: Secondary | ICD-10-CM | POA: Diagnosis not present

## 2021-05-05 DIAGNOSIS — Z1379 Encounter for other screening for genetic and chromosomal anomalies: Secondary | ICD-10-CM | POA: Diagnosis not present

## 2021-05-05 DIAGNOSIS — Z3682 Encounter for antenatal screening for nuchal translucency: Secondary | ICD-10-CM | POA: Diagnosis not present

## 2021-05-05 LAB — POCT URINALYSIS DIPSTICK OB
Blood, UA: NEGATIVE
Glucose, UA: NEGATIVE
Ketones, UA: NEGATIVE
Leukocytes, UA: NEGATIVE
Nitrite, UA: NEGATIVE
POC,PROTEIN,UA: NEGATIVE

## 2021-05-05 MED ORDER — ASPIRIN 81 MG PO TBEC: 162.0000 mg | DELAYED_RELEASE_TABLET | Freq: Every day | ORAL | 2 refills | Status: DC

## 2021-05-05 MED ORDER — BLOOD PRESSURE MONITOR MISC: 1.0000 | 0 refills | Status: DC

## 2021-05-05 NOTE — Patient Instructions (Signed)
Melanie Gonzales, thank you for choosing our office today! We appreciate the opportunity to meet your healthcare needs. You may receive a short survey by mail, e-mail, or through [employer]. If you are happy with your care we would appreciate if you could take just a few minutes to complete the survey questions. We read all of your comments and take your feedback very seriously. Thank you again for choosing our office.  Center for [employer] [facility] Team at [facility]  [facility] at [facility] ([address] [place], [place] [address]) Entrance C, located off of [address] Free 24/7 valet parking   Nausea & Vomiting Have saltine crackers or pretzels by your bed and eat a few bites before you raise your head out of bed in the morning Eat small frequent meals throughout the day instead of large meals Drink plenty of fluids throughout the day to stay hydrated, just don't drink a lot of fluids with your meals.  This can make your stomach fill up faster making you feel sick Do not brush your teeth right after you eat Products with real ginger are good for nausea, like ginger ale and ginger hard candy Make sure it says made with real ginger! Sucking on sour candy like lemon heads is also good for nausea If your prenatal vitamins make you nauseated, take them at night so you will sleep through the nausea Sea Bands If you feel like you need medicine for the nausea & vomiting please let us know If you are unable to keep any fluids or food down please let us know   Constipation Drink plenty of fluid, preferably water, throughout the day Eat foods high in fiber such as fruits, vegetables, and grains Exercise, such as walking, is a good way to keep your bowels regular Drink warm fluids, especially warm prune juice, or decaf coffee Eat a 1/2 cup of real oatmeal (not instant), 1/2 cup applesauce, and 1/2-1 cup warm prune juice every day If needed, you may take Colace (docusate sodium) stool  softener once or twice a day to help keep the stool soft.  If you still are having problems with constipation, you may take Miralax once daily as needed to help keep your bowels regular.   Home Blood Pressure Monitoring for Patients   Your provider has recommended that you check your blood pressure (BP) at least once a week at home. If you do not have a blood pressure cuff at home, one will be provided for you. Contact your provider if you have not received your monitor within 1 week.   Helpful Tips for Accurate Home Blood Pressure Checks  Don't smoke, exercise, or drink caffeine 30 minutes before checking your BP Use the restroom before checking your BP (a full bladder can raise your pressure) Relax in a comfortable upright chair Feet on the ground Left arm resting comfortably on a flat surface at the level of your heart Legs uncrossed Back supported Sit quietly and don't talk Place the cuff on your bare arm Adjust snuggly, so that only two fingertips can fit between your skin and the top of the cuff Check 2 readings separated by at least one minute Keep a log of your BP readings For a visual, please reference this diagram: http://ccnc.care/bpdiagram  Provider Name: [facility]     Phone: [phone]  Zone 1: ALL CLEAR  Continue to monitor your symptoms:  BP reading is less than 140 (top number) or less than 90 (bottom  number)  No right upper stomach pain No headaches or seeing spots No feeling nauseated or throwing up No swelling in face and hands  Zone 2: CAUTION Call your doctor's office for any of the following:  BP reading is greater than 140 (top number) or greater than 90 (bottom number)  Stomach pain under your ribs in the middle or right side Headaches or seeing spots Feeling nauseated or throwing up Swelling in face and hands  Zone 3: EMERGENCY  Seek immediate medical care if you have any of the following:  BP reading is greater than160 (top number) or  greater than 110 (bottom number) Severe headaches not improving with Tylenol Serious difficulty catching your breath Any worsening symptoms from Zone 2    First Trimester of Pregnancy The first trimester of pregnancy is from week 1 until the end of week 12 (months 1 through 3). A week after a sperm fertilizes an egg, the egg will implant on the wall of the uterus. This embryo will begin to develop into a baby. Genes from you and your partner are forming the baby. The female genes determine whether the baby is a boy or a girl. At 6-8 weeks, the eyes and face are formed, and the heartbeat can be seen on ultrasound. At the end of 12 weeks, all the baby's organs are formed.  Now that you are pregnant, you will want to do everything you can to have a healthy baby. Two of the most important things are to get good prenatal care and to follow your health care provider's instructions. Prenatal care is all the medical care you receive before the baby's birth. This care will help prevent, find, and treat any problems during the pregnancy and childbirth. BODY CHANGES Your body goes through many changes during pregnancy. The changes vary from woman to woman.  You may gain or lose a couple of pounds at first. You may feel sick to your stomach (nauseous) and throw up (vomit). If the vomiting is uncontrollable, call your health care provider. You may tire easily. You may develop headaches that can be relieved by medicines approved by your health care provider. You may urinate more often. Painful urination may mean you have a bladder infection. You may develop heartburn as a result of your pregnancy. You may develop constipation because certain hormones are causing the muscles that push waste through your intestines to slow down. You may develop hemorrhoids or swollen, bulging veins (varicose veins). Your breasts may begin to grow larger and become tender. Your nipples may stick out more, and the tissue that  surrounds them (areola) may become darker. Your gums may bleed and may be sensitive to brushing and flossing. Dark spots or blotches (chloasma, mask of pregnancy) may develop on your face. This will likely fade after the baby is born. Your menstrual periods will stop. You may have a loss of appetite. You may develop cravings for certain kinds of food. You may have changes in your emotions from day to day, such as being excited to be pregnant or being concerned that something may go wrong with the pregnancy and baby. You may have more vivid and strange dreams. You may have changes in your hair. These can include thickening of your hair, rapid growth, and changes in texture. Some women also have hair loss during or after pregnancy, or hair that feels dry or thin. Your hair will most likely return to normal after your baby is born. WHAT TO EXPECT AT YOUR PRENATAL  VISITS During a routine prenatal visit: You will be weighed to make sure you and the baby are growing normally. Your blood pressure will be taken. Your abdomen will be measured to track your baby's growth. The fetal heartbeat will be listened to starting around week 10 or 12 of your pregnancy. Test results from any previous visits will be discussed. Your health care provider may ask you: How you are feeling. If you are feeling the baby move. If you have had any abnormal symptoms, such as leaking fluid, bleeding, severe headaches, or abdominal cramping. If you have any questions. Other tests that may be performed during your first trimester include: Blood tests to find your blood type and to check for the presence of any previous infections. They will also be used to check for low iron levels (anemia) and Rh antibodies. Later in the pregnancy, blood tests for diabetes will be done along with other tests if problems develop. Urine tests to check for infections, diabetes, or protein in the urine. An ultrasound to confirm the proper growth  and development of the baby. An amniocentesis to check for possible genetic problems. Fetal screens for spina bifida and Down syndrome. You may need other tests to make sure you and the baby are doing well. HOME CARE INSTRUCTIONS  Medicines Follow your health care provider's instructions regarding medicine use. Specific medicines may be either safe or unsafe to take during pregnancy. Take your prenatal vitamins as directed. If you develop constipation, try taking a stool softener if your health care provider approves. Diet Eat regular, well-balanced meals. Choose a variety of foods, such as meat or vegetable-based protein, fish, milk and low-fat dairy products, vegetables, fruits, and whole grain breads and cereals. Your health care provider will help you determine the amount of weight gain that is right for you. Avoid raw meat and uncooked cheese. These carry germs that can cause birth defects in the baby. Eating four or five small meals rather than three large meals a day may help relieve nausea and vomiting. If you start to feel nauseous, eating a few soda crackers can be helpful. Drinking liquids between meals instead of during meals also seems to help nausea and vomiting. If you develop constipation, eat more high-fiber foods, such as fresh vegetables or fruit and whole grains. Drink enough fluids to keep your urine clear or pale yellow. Activity and Exercise Exercise only as directed by your health care provider. Exercising will help you: Control your weight. Stay in shape. Be prepared for labor and delivery. Experiencing pain or cramping in the lower abdomen or low back is a good sign that you should stop exercising. Check with your health care provider before continuing normal exercises. Try to avoid standing for long periods of time. Move your legs often if you must stand in one place for a long time. Avoid heavy lifting. Wear low-heeled shoes, and practice good posture. You may  continue to have sex unless your health care provider directs you otherwise. Relief of Pain or Discomfort Wear a good support bra for breast tenderness.   Take warm sitz baths to soothe any pain or discomfort caused by hemorrhoids. Use hemorrhoid cream if your health care provider approves.   Rest with your legs elevated if you have leg cramps or low back pain. If you develop varicose veins in your legs, wear support hose. Elevate your feet for 15 minutes, 3-4 times a day. Limit salt in your diet. Prenatal Care Schedule your prenatal visits by the  twelfth week of pregnancy. They are usually scheduled monthly at first, then more often in the last 2 months before delivery. Write down your questions. Take them to your prenatal visits. Keep all your prenatal visits as directed by your health care provider. Safety Wear your seat belt at all times when driving. Make a list of emergency phone numbers, including numbers for family, friends, the hospital, and police and fire departments. General Tips Ask your health care provider for a referral to a local prenatal education class. Begin classes no later than at the beginning of month 6 of your pregnancy. Ask for help if you have counseling or nutritional needs during pregnancy. Your health care provider can offer advice or refer you to specialists for help with various needs. Do not use hot tubs, steam rooms, or saunas. Do not douche or use tampons or scented sanitary pads. Do not cross your legs for long periods of time. Avoid cat litter boxes and soil used by cats. These carry germs that can cause birth defects in the baby and possibly loss of the fetus by miscarriage or stillbirth. Avoid all smoking, herbs, alcohol, and medicines not prescribed by your health care provider. Chemicals in these affect the formation and growth of the baby. Schedule a dentist appointment. At home, brush your teeth with a soft toothbrush and be gentle when you floss. SEEK  MEDICAL CARE IF:  You have dizziness. You have mild pelvic cramps, pelvic pressure, or nagging pain in the abdominal area. You have persistent nausea, vomiting, or diarrhea. You have a bad smelling vaginal discharge. You have pain with urination. You notice increased swelling in your face, hands, legs, or ankles. SEEK IMMEDIATE MEDICAL CARE IF:  You have a fever. You are leaking fluid from your vagina. You have spotting or bleeding from your vagina. You have severe abdominal cramping or pain. You have rapid weight gain or loss. You vomit blood or material that looks like coffee grounds. You are exposed to [place] measles and have never had them. You are exposed to fifth disease or chickenpox. You develop a severe headache. You have shortness of breath. You have any kind of trauma, such as from a fall or a car accident. Document Released: 05/10/2001 Document Revised: 09/30/2013 Document Reviewed: 03/26/2013 [facility] Patient Information 2015 [place], [place]. This information is not intended to replace advice given to you by your health care provider. Make sure you discuss any questions you have with your health care provider.

## 2021-05-05 NOTE — Progress Notes (Signed)
[place] 12+1 wks,measurements c/w dates,crl 62.32 mm,FHR 157 bpm,anterior placenta,normal right ovary,simple left corpus luteal cyst 3.1 x 2.8 x 2.3 cm

## 2021-05-05 NOTE — Progress Notes (Signed)
INITIAL OBSTETRICAL VISIT Patient name: Melanie Gonzales MRN [id number]  Date of birth: Nov 19, 1990 Chief Complaint:   Initial Prenatal Visit (+ nausea; decreased appetite)  History of Present Illness:   Melanie Gonzales is a 30 y.o. G90P1001 African-American female at [redacted]w[redacted]d by [place] at 8 weeks with an Estimated Date of Delivery: 11/16/21 being seen today for her initial obstetrical visit.   No LMP recorded (lmp unknown). Patient is pregnant. Her obstetrical history is significant for  term SVB x 1, developed immediate pp pre-e .  CHTN currently on norvasc 2.5mg  Today she reports  decreased appetite and nausea. Taking 2 diclegis at night, 1 in am. Last pap 11/19/20. Results were: NILM w/ HRHPV negative  Depression screen [facility] 2/9 05/05/2021 04/13/2021 11/19/2020 04/13/2020 09/12/2018  Decreased Interest 1 0 0 0 0  Down, Depressed, Hopeless 0 0 0 0 0  PHQ - 2 Score 1 0 0 0 0  Altered sleeping 0 - 0 - -  Tired, decreased energy 2 - 3 - -  Change in appetite 2 - 0 - -  Feeling bad or failure about yourself  0 - 1 - -  Trouble concentrating 0 - 0 - -  Moving slowly or fidgety/restless 0 - 0 - -  Suicidal thoughts 0 - 0 - -  PHQ-9 Score 5 - 4 - -     GAD 7 : Generalized Anxiety Score 05/05/2021 11/19/2020  Nervous, Anxious, on Edge 1 1  Control/stop worrying 1 0  Worry too much - different things 1 1  Trouble relaxing 0 0  Restless 0 0  Easily annoyed or irritable 1 0  Afraid - awful might happen 0 0  Total GAD 7 Score 4 2     Review of Systems:   Pertinent items are noted in HPI Denies cramping/contractions, leakage of fluid, vaginal bleeding, abnormal vaginal discharge w/ itching/odor/irritation, headaches, visual changes, shortness of breath, chest pain, abdominal pain, severe nausea/vomiting, or problems with urination or bowel movements unless otherwise stated above.  Pertinent History Reviewed:  Reviewed past medical,surgical, social, obstetrical and family history.  Reviewed  problem list, medications and allergies. OB History  Gravida Para Term Preterm AB Living  2 1 1     1   SAB IAB Ectopic Multiple Live Births          1    # Outcome Date GA Lbr Len/2nd Weight Sex Delivery Anes PTL Lv  2 Current           1 Term 03/08/13 [redacted]w[redacted]d 07:39 / 01:27 6 lb 8.9 oz (2.974 kg) F Vag-Spont EPI N LIV   Physical Assessment:   Vitals:   05/05/21 0905  BP: 115/80  Pulse: 79  Weight: 138 lb (62.6 kg)  Body mass index is 22.27 kg/m.       Physical Examination:  General appearance - well appearing, and in no distress  Mental status - alert, oriented to person, place, and time  Psych:  She has a normal mood and affect  Skin - warm and dry, normal color, no suspicious lesions noted  Chest - effort normal, all lung fields clear to auscultation bilaterally  Heart - normal rate and regular rhythm  Abdomen - soft, nontender  Extremities:  No swelling or varicosities noted  Thin prep pap is not done   Chaperone: N/A    TODAY'S NT [place] 12+1 wks,measurements c/w dates,crl 62.32 mm,FHR 157 bpm,anterior placenta,normal right ovary,simple left corpus luteal cyst 3.1 x 2.8 x 2.3 cm  Results for orders placed or performed in visit on 05/05/21 (from the past 24 hour(s))  POC Urinalysis Dipstick OB   Collection Time: 05/05/21  9:20 AM  Result Value Ref Range   Color, UA     Clarity, UA     Glucose, UA Negative Negative   Bilirubin, UA     Ketones, UA neg    Spec Grav, UA     Blood, UA neg    pH, UA     POC,PROTEIN,UA Negative Negative, Trace, Small (1+), Moderate (2+), Large (3+), 4+   Urobilinogen, UA     Nitrite, UA neg    Leukocytes, UA Negative Negative   Appearance     Odor      Assessment & Plan:  1) High-Risk Pregnancy G2P1001 at [redacted]w[redacted]d with an Estimated Date of Delivery: 11/16/21   2) Initial OB visit  3) CHTN w/ h/o pp pre-e> on norvasc 2.5mg , add ASA 162mg , get baseline labs today  4) Nausea> add 1 diclegis in afternoon, let know if needs  phenergan  5) Decreased appetite> hopefully will improve as nausea does  Meds:  Meds ordered this encounter  Medications   Blood Pressure Monitor MISC    Sig: 1 each by Does not apply route once a week.    Dispense:  1 each    Refill:  0    Please mail to patient   aspirin 81 MG EC tablet    Sig: Take 2 tablets (162 mg total) by mouth daily. Swallow whole.    Dispense:  180 tablet    Refill:  2    Order Specific Question:   Supervising Provider    Answer:   [place] H [[id number]]    Initial labs obtained Continue prenatal vitamins Reviewed n/v relief measures and warning s/s to report Reviewed recommended weight gain based on pre-gravid BMI Encouraged well-balanced diet Genetic & carrier screening discussed: requests Panorama, NT/IT, and Horizon  Ultrasound discussed; fetal survey: requested CCNC completed> form faxed if has or is planning to apply for medicaid The nature of [place] for [employer] with multiple MDs and other Advanced Practice Providers was explained to patient; also emphasized that fellows, residents, and students are part of our team. Does not have home bp cuff. Office bp cuff given: no. Rx sent: yes. Check bp weekly, let [employer] know if consistently >140/90.   Follow-up: Return in about 4 weeks (around 06/02/2021) for HROB, 2nd IT, MD or CNM, in person.   Orders Placed This Encounter  Procedures   Urine Culture   GC/Chlamydia Probe Amp   Integrated 1   CBC/D/Plt+RPR+Rh+ABO+RubIgG...   Pain Management Screening Profile (10S)   Comprehensive metabolic panel   Protein / creatinine ratio, urine   POC Urinalysis Dipstick OB    07/31/2021 CNM, [facility] 05/05/2021 10:24 AM

## 2021-05-06 LAB — CBC/D/PLT+RPR+RH+ABO+RUBIGG...
Antibody Screen: NEGATIVE
Basophils Absolute: 0 10*3/uL (ref 0.0–0.2)
Basos: 1 %
EOS (ABSOLUTE): 0 10*3/uL (ref 0.0–0.4)
Eos: 1 %
HCV Ab: <0.1 s/co ratio (ref 0.0–0.9)
HIV Screen 4th Generation wRfx: NONREACTIVE
Hematocrit: 35.2 % (ref 34.0–46.6)
Hemoglobin: 11.6 g/dL (ref 11.1–15.9)
Hepatitis B Surface Ag: NEGATIVE
Immature Grans (Abs): 0 10*3/uL (ref 0.0–0.1)
Immature Granulocytes: 0 %
Lymphocytes Absolute: 1.6 10*3/uL (ref 0.7–3.1)
Lymphs: 34 %
MCH: 30.4 pg (ref 26.6–33.0)
MCHC: 33 g/dL (ref 31.5–35.7)
MCV: 92 fL (ref 79–97)
Monocytes Absolute: 0.6 10*3/uL (ref 0.1–0.9)
Monocytes: 13 %
Neutrophils Absolute: 2.5 10*3/uL (ref 1.4–7.0)
Neutrophils: 51 %
Platelets: 354 10*3/uL (ref 150–450)
RBC: 3.81 x10E6/uL (ref 3.77–5.28)
RDW: 12.5 % (ref 11.7–15.4)
RPR Ser Ql: NONREACTIVE
Rh Factor: POSITIVE
Rubella Antibodies, IGG: 3.51 index (ref >0.99)
WBC: 4.8 10*3/uL (ref 3.4–10.8)

## 2021-05-06 LAB — PROTEIN / CREATININE RATIO, URINE
Creatinine, Urine: 138.9 mg/dL
Protein, Ur: 13.8 mg/dL
Protein/Creat Ratio: 99 mg/g creat (ref 0–200)

## 2021-05-06 LAB — COMPREHENSIVE METABOLIC PANEL
ALT: 14 IU/L (ref 0–32)
AST: 18 IU/L (ref 0–40)
Albumin/Globulin Ratio: 1.3 (ref 1.2–2.2)
Albumin: 4 g/dL (ref 3.9–5.0)
Alkaline Phosphatase: 65 IU/L (ref 44–121)
BUN/Creatinine Ratio: 13 (ref 9–23)
BUN: 8 mg/dL (ref 6–20)
Bilirubin Total: 0.4 mg/dL (ref 0.0–1.2)
CO2: 22 mmol/L (ref 20–29)
Calcium: 8.9 mg/dL (ref 8.7–10.2)
Chloride: 103 mmol/L (ref 96–106)
Creatinine, Ser: 0.6 mg/dL (ref 0.57–1.00)
Globulin, Total: 3.2 g/dL (ref 1.5–4.5)
Glucose: 77 mg/dL (ref 70–99)
Potassium: 4.1 mmol/L (ref 3.5–5.2)
Sodium: 136 mmol/L (ref 134–144)
Total Protein: 7.2 g/dL (ref 6.0–8.5)
eGFR: 125 mL/min/{1.73_m2} (ref >59)

## 2021-05-06 LAB — HCV INTERPRETATION

## 2021-05-06 LAB — MED LIST OPTION NOT SELECTED

## 2021-05-07 LAB — INTEGRATED 1
Crown Rump Length: 62.3 mm
Gest. Age on Collection Date: 12.4 weeks
Maternal Age at EDD: 29.5 yr
Nuchal Translucency (NT): 1.2 mm
Number of Fetuses: 1
PAPP-A Value: 2209.2 ng/mL
Weight: 138 [lb_av]

## 2021-05-07 LAB — URINE CULTURE

## 2021-05-09 LAB — PMP SCREEN PROFILE (10S), URINE
Amphetamine Scrn, Ur: NEGATIVE ng/mL
BARBITURATE SCREEN URINE: NEGATIVE ng/mL
BENZODIAZEPINE SCREEN, URINE: NEGATIVE ng/mL
CANNABINOIDS UR QL SCN: NEGATIVE ng/mL
Cocaine (Metab) Scrn, Ur: NEGATIVE ng/mL
Creatinine(Crt), U: 160.5 mg/dL (ref 20.0–300.0)
Methadone Screen, Urine: NEGATIVE ng/mL
OXYCODONE+OXYMORPHONE UR QL SCN: NEGATIVE ng/mL
Opiate Scrn, Ur: NEGATIVE ng/mL
Ph of Urine: 7.3 (ref 4.5–8.9)
Phencyclidine Qn, Ur: NEGATIVE ng/mL
Propoxyphene Scrn, Ur: NEGATIVE ng/mL

## 2021-05-11 LAB — GC/CHLAMYDIA PROBE AMP
Chlamydia trachomatis, NAA: NEGATIVE
Neisseria Gonorrhoeae by PCR: NEGATIVE

## 2021-05-14 ENCOUNTER — Encounter: Payer: Self-pay | Admitting: Women's Health

## 2021-05-17 ENCOUNTER — Encounter: Payer: Self-pay | Admitting: Women's Health

## 2021-05-26 ENCOUNTER — Encounter: Payer: Self-pay | Admitting: Advanced Practice Midwife

## 2021-05-30 NOTE — L&D Delivery Note (Signed)
OB/GYN Faculty Practice Delivery Note  Kameah Thresher is a 31 y.o. G2P2002 s/p SVD at [redacted]w[redacted]d. She was admitted for SOL.   ROM: 2h 62m with clear fluid GBS Status: Negative; received Ancef due to history of neonatal GBS sepsis/meningitis   Delivery Date/Time: 11/03/21 at 0901  Delivery: Assessed patient at bedside due to late occurring deep variable decelerations. Patient found to be 10/100/+2 station.  Recommended that patient start pushing.  With the next two contractions, the head delivered ROA. No nuchal cord present. Shoulder and body delivered in usual fashion. Infant with spontaneous cry, placed on mother's abdomen, dried and stimulated. Cord clamped x 2 after 1-minute delay and cut by FOB under direct supervision. Cord blood drawn. Placenta delivered spontaneously with gentle cord traction. Fundus firm with massage and Pitocin. Labia, perineum, vagina, and cervix were inspected. Cervix noted to be bruised and swollen at introitus. No lacerations noted.   Placenta: Intact, 3VC - sent to L&D Complications: None  Lacerations: None  EBL: 250 cc Analgesia: Epidural   Infant: Viable female  APGARs 8 and 15    Vilma Meckel, MD OB/GYN Fellow, Faculty Practice

## 2021-06-02 ENCOUNTER — Encounter: Payer: Medicaid Other | Admitting: Advanced Practice Midwife

## 2021-06-08 ENCOUNTER — Other Ambulatory Visit: Payer: Self-pay

## 2021-06-08 ENCOUNTER — Encounter: Payer: Self-pay | Admitting: Women's Health

## 2021-06-08 ENCOUNTER — Ambulatory Visit (INDEPENDENT_AMBULATORY_CARE_PROVIDER_SITE_OTHER): Payer: Medicaid Other | Admitting: Women's Health

## 2021-06-08 VITALS — BP 122/74 | HR 81 | Wt 137.8 lb

## 2021-06-08 DIAGNOSIS — Z1379 Encounter for other screening for genetic and chromosomal anomalies: Secondary | ICD-10-CM

## 2021-06-08 DIAGNOSIS — O10919 Unspecified pre-existing hypertension complicating pregnancy, unspecified trimester: Secondary | ICD-10-CM

## 2021-06-08 DIAGNOSIS — Z363 Encounter for antenatal screening for malformations: Secondary | ICD-10-CM

## 2021-06-08 DIAGNOSIS — O0992 Supervision of high risk pregnancy, unspecified, second trimester: Secondary | ICD-10-CM

## 2021-06-08 LAB — POCT URINALYSIS DIPSTICK OB
Blood, UA: NEGATIVE
Glucose, UA: NEGATIVE
Ketones, UA: NEGATIVE
Leukocytes, UA: NEGATIVE
Nitrite, UA: NEGATIVE

## 2021-06-08 NOTE — Patient Instructions (Addendum)
Benedetto Coons, thank you for choosing our office today! We appreciate the opportunity to meet your healthcare needs. You may receive a short survey by mail, e-mail, or through [employer]. If you are happy with your care we would appreciate if you could take just a few minutes to complete the survey questions. We read all of your comments and take your feedback very seriously. Thank you again for choosing our office.  Center for [employer] Team at [place] at [facility] ([place], [place] [address]) Entrance C, located off of [place] [address] parking  Go to [email or web address] to register for FREE online childbirth classes  Call the office [phone]) or go to [facility] if: You begin to severe cramping Your water breaks.  Sometimes it is a big gush of fluid, sometimes it is just a trickle that keeps getting your panties wet or running down your legs You have vaginal bleeding.  It is normal to have a small amount of spotting if your cervix was checked.   [facility] Pediatricians/Family Doctors [facility]): [address] [place], [facility]: [address] [place], [phone]                [facility]): [place], [phone] (call to ask if accepting patients) [facility] Department: [address], [place], [place] Pediatricians/Family Doctors [facility]): [place]. [place], [address], [facility]: [address] [place], [place] [facility] [place]: [place], [facility]): [phone] [facility]: [address], [place]: [address], [facility]: [phone], [phone]  Home Blood Pressure Monitoring for Patients   Your provider has recommended that you check your blood pressure (BP) at least once a week at home. If you do not have a blood pressure cuff at home, one will be provided for you. Contact your provider if you have not received your monitor within 1 week.   Helpful Tips for Accurate Home Blood Pressure Checks  Don't smoke, exercise, or drink caffeine 30 minutes before checking your BP Use the restroom before checking your BP (a full bladder can raise your pressure) Relax in a comfortable upright chair Feet on the ground Left arm resting comfortably on a flat surface at the level of your heart Legs uncrossed Back supported Sit quietly and don't talk Place the cuff on your bare arm Adjust snuggly, so that only two fingertips can fit between your skin and the top of the cuff Check 2 readings separated by at least one minute Keep a log of your BP readings For a visual, please reference this diagram: http://ccnc.care/bpdiagram  Provider Name: [facility]     Phone: [phone]  Zone 1: ALL CLEAR  Continue to monitor your symptoms:  BP reading is less than 140 (top number) or less than 90 (bottom number)  No right upper stomach pain No headaches or seeing spots No feeling nauseated or throwing up No swelling in face and hands  Zone 2: CAUTION Call your doctor's office for any of the following:  BP reading is greater than 140 (top number) or greater than  90 (bottom number)  Stomach pain under your ribs in the middle or right side Headaches or seeing spots Feeling nauseated or throwing up Swelling in face and hands  Zone 3: EMERGENCY  Seek immediate medical care if you have any of the following:  BP reading is greater than160 (top number) or greater than 110 (bottom number) Severe headaches not improving with Tylenol Serious difficulty catching your breath Any worsening symptoms from  Zone 2     Second Trimester of Pregnancy The second trimester is from week 14 through week 27 (months 4 through 6). The second trimester is often a time when you feel your best. Your body has adjusted to being pregnant, and you begin to feel better physically. Usually, morning sickness has lessened or quit completely, you may have more energy, and you may have an increase in appetite. The second trimester is also a time when the fetus is growing rapidly. At the end of the sixth month, the fetus is about 9 inches long and weighs about 1 pounds. You will likely begin to feel the baby move (quickening) between 16 and 20 weeks of pregnancy. Body changes during your second trimester Your body continues to go through many changes during your second trimester. The changes vary from woman to woman. Your weight will continue to increase. You will notice your lower abdomen bulging out. You may begin to get stretch marks on your hips, abdomen, and breasts. You may develop headaches that can be relieved by medicines. The medicines should be approved by your health care provider. You may urinate more often because the fetus is pressing on your bladder. You may develop or continue to have heartburn as a result of your pregnancy. You may develop constipation because certain hormones are causing the muscles that push waste through your intestines to slow down. You may develop hemorrhoids or swollen, bulging veins (varicose veins). You may have back pain. This is caused by: Weight gain. Pregnancy hormones that are relaxing the joints in your pelvis. A shift in weight and the muscles that support your balance. Your breasts will continue to grow and they will continue to become tender. Your gums may bleed and may be sensitive to brushing and flossing. Dark spots or blotches (chloasma, mask of pregnancy) may develop on your face. This will likely fade after the baby is born. A dark line from your belly button to  the pubic area (linea nigra) may appear. This will likely fade after the baby is born. You may have changes in your hair. These can include thickening of your hair, rapid growth, and changes in texture. Some women also have hair loss during or after pregnancy, or hair that feels dry or thin. Your hair will most likely return to normal after your baby is born.  What to expect at prenatal visits During a routine prenatal visit: You will be weighed to make sure you and the fetus are growing normally. Your blood pressure will be taken. Your abdomen will be measured to track your baby's growth. The fetal heartbeat will be listened to. Any test results from the previous visit will be discussed.  Your health care provider may ask you: How you are feeling. If you are feeling the baby move. If you have had any abnormal symptoms, such as leaking fluid, bleeding, severe headaches, or abdominal cramping. If you are using any tobacco products, including cigarettes, chewing tobacco, and electronic cigarettes. If you have any questions.  Other tests that may be performed during   your second trimester include: Blood tests that check for: Low iron levels (anemia). High blood sugar that affects pregnant women (gestational diabetes) between 24 and 28 weeks. Rh antibodies. This is to check for a protein on red blood cells (Rh factor). Urine tests to check for infections, diabetes, or protein in the urine. An ultrasound to confirm the proper growth and development of the baby. An amniocentesis to check for possible genetic problems. Fetal screens for spina bifida and Down syndrome. HIV (human immunodeficiency virus) testing. Routine prenatal testing includes screening for HIV, unless you choose not to have this test.  Follow these instructions at home: Medicines Follow your health care provider's instructions regarding medicine use. Specific medicines may be either safe or unsafe to take during  pregnancy. Take a prenatal vitamin that contains at least 600 micrograms (mcg) of folic acid. If you develop constipation, try taking a stool softener if your health care provider approves. Eating and drinking Eat a balanced diet that includes fresh fruits and vegetables, whole grains, good sources of protein such as meat, eggs, or tofu, and low-fat dairy. Your health care provider will help you determine the amount of weight gain that is right for you. Avoid raw meat and uncooked cheese. These carry germs that can cause birth defects in the baby. If you have low calcium intake from food, talk to your health care provider about whether you should take a daily calcium supplement. Limit foods that are high in fat and processed sugars, such as fried and sweet foods. To prevent constipation: Drink enough fluid to keep your urine clear or pale yellow. Eat foods that are high in fiber, such as fresh fruits and vegetables, whole grains, and beans. Activity Exercise only as directed by your health care provider. Most women can continue their usual exercise routine during pregnancy. Try to exercise for 30 minutes at least 5 days a week. Stop exercising if you experience uterine contractions. Avoid heavy lifting, wear low heel shoes, and practice good posture. A sexual relationship may be continued unless your health care provider directs you otherwise. Relieving pain and discomfort Wear a good support bra to prevent discomfort from breast tenderness. Take warm sitz baths to soothe any pain or discomfort caused by hemorrhoids. Use hemorrhoid cream if your health care provider approves. Rest with your legs elevated if you have leg cramps or low back pain. If you develop varicose veins, wear support hose. Elevate your feet for 15 minutes, 3-4 times a day. Limit salt in your diet. Prenatal Care Write down your questions. Take them to your prenatal visits. Keep all your prenatal visits as told by your health  care provider. This is important. Safety Wear your seat belt at all times when driving. Make a list of emergency phone numbers, including numbers for family, friends, the hospital, and police and fire departments. General instructions Ask your health care provider for a referral to a local prenatal education class. Begin classes no later than the beginning of month 6 of your pregnancy. Ask for help if you have counseling or nutritional needs during pregnancy. Your health care provider can offer advice or refer you to specialists for help with various needs. Do not use hot tubs, steam rooms, or saunas. Do not douche or use tampons or scented sanitary pads. Do not cross your legs for long periods of time. Avoid cat litter boxes and soil used by cats. These carry germs that can cause birth defects in the baby and possibly loss of the   fetus by miscarriage or stillbirth. Avoid all smoking, herbs, alcohol, and unprescribed drugs. Chemicals in these products can affect the formation and growth of the baby. Do not use any products that contain nicotine or tobacco, such as cigarettes and e-cigarettes. If you need help quitting, ask your health care provider. Visit your dentist if you have not gone yet during your pregnancy. Use a soft toothbrush to brush your teeth and be gentle when you floss. Contact a health care provider if: You have dizziness. You have mild pelvic cramps, pelvic pressure, or nagging pain in the abdominal area. You have persistent nausea, vomiting, or diarrhea. You have a bad smelling vaginal discharge. You have pain when you urinate. Get help right away if: You have a fever. You are leaking fluid from your vagina. You have spotting or bleeding from your vagina. You have severe abdominal cramping or pain. You have rapid weight gain or weight loss. You have shortness of breath with chest pain. You notice sudden or extreme swelling of your face, hands, ankles, feet, or legs. You  have not felt your baby move in over an hour. You have severe headaches that do not go away when you take medicine. You have vision changes. Summary The second trimester is from week 14 through week 27 (months 4 through 6). It is also a time when the fetus is growing rapidly. Your body goes through many changes during pregnancy. The changes vary from woman to woman. Avoid all smoking, herbs, alcohol, and unprescribed drugs. These chemicals affect the formation and growth your baby. Do not use any tobacco products, such as cigarettes, chewing tobacco, and e-cigarettes. If you need help quitting, ask your health care provider. Contact your health care provider if you have any questions. Keep all prenatal visits as told by your health care provider. This is important. This information is not intended to replace advice given to you by your health care provider. Make sure you discuss any questions you have with your health care provider. Document Released: 05/10/2001 Document Revised: 10/22/2015 Document Reviewed: 07/17/2012 Elsevier Interactive Patient Education  2017 [employer].   Sciatica Rehab Ask your health care provider which exercises are safe for you. Do exercises exactly as told by your health care provider and adjust them as directed. It is normal to feel mild stretching, pulling, tightness, or discomfort as you do these exercises. Stop right away if you feel sudden pain or your pain gets worse. Do not begin these exercises until told by your health care provider. Stretching and range-of-motion exercises These exercises warm up your muscles and joints and improve the movement and flexibility of your hips and back. These exercises also help to relieve pain, numbness, and tingling. Sciatic nerve glide Sit in a chair with your head facing down toward your chest. Place your hands behind your back. Let your shoulders slump forward. Slowly straighten one of your legs while you tilt your head  back as if you are looking toward the ceiling. Only straighten your leg as far as you can without making your symptoms worse. Hold this position for __________ seconds. Slowly return to the starting position. Repeat with your other leg. Repeat __________ times. Complete this exercise __________ times a day. Knee to chest with hip adduction and internal rotation  Lie on your back on a firm surface with both legs straight. Bend one of your knees and move it up toward your chest until you feel a gentle stretch in your lower back and buttock. Then,  move your knee toward the shoulder that is on the opposite side from your leg. This is hip adduction and internal rotation. Hold your leg in this position by holding on to the front of your knee. Hold this position for __________ seconds. Slowly return to the starting position. Repeat with your other leg. Repeat __________ times. Complete this exercise __________ times a day. Prone extension on elbows  Lie on your abdomen on a firm surface. A bed may be too soft for this exercise. Prop yourself up on your elbows. Use your arms to help lift your chest up until you feel a gentle stretch in your abdomen and your lower back. This will place some of your body weight on your elbows. If this is uncomfortable, try stacking pillows under your chest. Your hips should stay down, against the surface that you are lying on. Keep your hip and back muscles relaxed. Hold this position for __________ seconds. Slowly relax your upper body and return to the starting position. Repeat __________ times. Complete this exercise __________ times a day. Strengthening exercises These exercises build strength and endurance in your back. Endurance is the ability to use your muscles for a long time, even after they get tired. Pelvic tilt This exercise strengthens the muscles that lie deep in the abdomen. Lie on your back on a firm surface. Bend your knees and keep your feet flat  on the floor. Tense your abdominal muscles. Tip your pelvis up toward the ceiling and flatten your lower back into the floor. To help with this exercise, you may place a small towel under your lower back and try to push your back into the towel. Hold this position for __________ seconds. Let your muscles relax completely before you repeat this exercise. Repeat __________ times. Complete this exercise __________ times a day. Alternating arm and leg raises  Get on your hands and knees on a firm surface. If you are on a hard floor, you may want to use padding, such as an exercise mat, to cushion your knees. Line up your arms and legs. Your hands should be directly below your shoulders, and your knees should be directly below your hips. Lift your left leg behind you. At the same time, raise your right arm and straighten it in front of you. Do not lift your leg higher than your hip. Do not lift your arm higher than your shoulder. Keep your abdominal and back muscles tight. Keep your hips facing the ground. Do not arch your back. Keep your balance carefully, and do not hold your breath. Hold this position for __________ seconds. Slowly return to the starting position. Repeat with your right leg and your left arm. Repeat __________ times. Complete this exercise __________ times a day. Posture and body mechanics Good posture and healthy body mechanics can help to relieve stress in your body's tissues and joints. Body mechanics refers to the movements and positions of your body while you do your daily activities. Posture is part of body mechanics. Good posture means: Your spine is in its natural S-curve position (neutral). Your shoulders are pulled back slightly. Your head is not tipped forward. Follow these guidelines to improve your posture and body mechanics in your everyday activities. Standing  When standing, keep your spine neutral and your feet about hip width apart. Keep a slight bend in  your knees. Your ears, shoulders, and hips should line up. When you do a task in which you stand in one place for a long time,  place one foot up on a stable object that is 2-4 inches (5-10 cm) high, such as a footstool. This helps keep your spine neutral. Sitting  When sitting, keep your spine neutral and keep your feet flat on the floor. Use a footrest, if necessary, and keep your thighs parallel to the floor. Avoid rounding your shoulders, and avoid tilting your head forward. When working at a desk or a computer, keep your desk at a height where your hands are slightly lower than your elbows. Slide your chair under your desk so you are close enough to maintain good posture. When working at a computer, place your monitor at a height where you are looking straight ahead and you do not have to tilt your head forward or downward to look at the screen. Resting When lying down and resting, avoid positions that are most painful for you. If you have pain with activities such as sitting, bending, stooping, or squatting, lie in a position in which your body does not bend very much. For example, avoid curling up on your side with your arms and knees near your chest (fetal position). If you have pain with activities such as standing for a long time or reaching with your arms, lie with your spine in a neutral position and bend your knees slightly. Try the following positions: Lying on your side with a pillow between your knees. Lying on your back with a pillow under your knees. Lifting  When lifting objects, keep your feet at least shoulder width apart and tighten your abdominal muscles. Bend your knees and hips and keep your spine neutral. It is important to lift using the strength of your legs, not your back. Do not lock your knees straight out. Always ask for help to lift heavy or awkward objects. This information is not intended to replace advice given to you by your health care provider. Make sure you  discuss any questions you have with your health care provider. Document Revised: 09/07/2018 Document Reviewed: 06/07/2018 Elsevier Patient Education  [employer].

## 2021-06-08 NOTE — Progress Notes (Signed)
[facility] PREGNANCY VISIT Patient name: Melanie Gonzales MRN [id number]  Date of birth: 11/16/1990 Chief Complaint:   Routine Prenatal Visit and High Risk Gestation  History of Present Illness:   Melanie Gonzales is a 31 y.o. G79P1001 female at [redacted]w[redacted]d with an Estimated Date of Delivery: 11/16/21 being seen today for ongoing management of a high-risk pregnancy complicated by [facility] on norvasc 2.5mg .    Today she reports  n/v has improved, tried to come off meds but didn't feel good, so still taking. Feels hungry, but gets tired of food quickly, so not able to eat a lot, does drink a boost/ensure in the morning sometimes. Some sciatica pain . Contractions: Not present. Vag. Bleeding: None.  Movement: Present. denies leaking of fluid.   Depression screen [facility] 2/9 05/05/2021 04/13/2021 11/19/2020 04/13/2020 09/12/2018  Decreased Interest 1 0 0 0 0  Down, Depressed, Hopeless 0 0 0 0 0  PHQ - 2 Score 1 0 0 0 0  Altered sleeping 0 - 0 - -  Tired, decreased energy 2 - 3 - -  Change in appetite 2 - 0 - -  Feeling bad or failure about yourself  0 - 1 - -  Trouble concentrating 0 - 0 - -  Moving slowly or fidgety/restless 0 - 0 - -  Suicidal thoughts 0 - 0 - -  PHQ-9 Score 5 - 4 - -     GAD 7 : Generalized Anxiety Score 05/05/2021 11/19/2020  Nervous, Anxious, on Edge 1 1  Control/stop worrying 1 0  Worry too much - different things 1 1  Trouble relaxing 0 0  Restless 0 0  Easily annoyed or irritable 1 0  Afraid - awful might happen 0 0  Total GAD 7 Score 4 2     Review of Systems:   Pertinent items are noted in HPI Denies abnormal vaginal discharge w/ itching/odor/irritation, headaches, visual changes, shortness of breath, chest pain, abdominal pain, severe nausea/vomiting, or problems with urination or bowel movements unless otherwise stated above. Pertinent History Reviewed:  Reviewed past medical,surgical, social, obstetrical and family history.  Reviewed problem list, medications and  allergies. Physical Assessment:   Vitals:   06/08/21 1604  BP: 122/74  Pulse: 81  Weight: 137 lb 12.8 oz (62.5 kg)  Body mass index is 22.24 kg/m.           Physical Examination:   General appearance: alert, well appearing, and in no distress  Mental status: alert, oriented to person, place, and time  Skin: warm & dry   Extremities: Edema: None    Cardiovascular: normal heart rate noted  Respiratory: normal respiratory effort, no distress  Abdomen: gravid, soft, non-tender  Pelvic: Cervical exam deferred         Fetal Status: Fetal Heart Rate (bpm): 160   Movement: Present    Fetal Surveillance Testing today: doppler   Chaperone: N/A    Results for orders placed or performed in visit on 06/08/21 (from the past 24 hour(s))  POC Urinalysis Dipstick OB   Collection Time: 06/08/21  4:05 PM  Result Value Ref Range   Color, UA     Clarity, UA     Glucose, UA Negative Negative   Bilirubin, UA     Ketones, UA neg    Spec Grav, UA     Blood, UA neg    pH, UA     POC,PROTEIN,UA Trace Negative, Trace, Small (1+), Moderate (2+), Large (3+), 4+   Urobilinogen, UA  Nitrite, UA neg    Leukocytes, UA Negative Negative   Appearance     Odor      Assessment & Plan:  High-risk pregnancy: G2P1001 at [redacted]w[redacted]d with an Estimated Date of Delivery: 11/16/21   1) CHTN, stable on norvasc 2.5mg  and ASA  2) H/O pre-e, ASA  3) Sciatica> gave printed exercises  4) Decreased appetite> ok to eat small frequent snacks/meals, boost/ensure as needed  Meds: No orders of the defined types were placed in this encounter.   Labs/procedures today: 2nd IT  Treatment Plan:     Growth u/s q 4wks    2x/wk testing nst/sono @ 32wks     Deliver 38-39wks (37wks or prn if poor control)____   Reviewed: Preterm labor symptoms and general obstetric precautions including but not limited to vaginal bleeding, contractions, leaking of fluid and fetal movement were reviewed in detail with the patient.  All  questions were answered. Does have home bp cuff. Office bp cuff given: not applicable. Check bp weekly, let us know if consistently >150 and/or >95.  Follow-up: Return in about 2 weeks (around 06/22/2021) for HROB, [id number], MD or CNM, in person.   No future appointments.  Orders Placed This Encounter  Procedures   US OB Comp + 14 Wk   INTEGRATED 2   POC Urinalysis Dipstick OB   Roma Schanz CNM, [facility] 06/08/2021 4:25 PM

## 2021-06-10 LAB — INTEGRATED 2
AFP MoM: 0.89
Alpha-Fetoprotein: 40.4 ng/mL
Crown Rump Length: 62.3 mm
DIA MoM: 0.4
DIA Value: 66.1 pg/mL
Estriol, Unconjugated: 1.35 ng/mL
Gest. Age on Collection Date: 12.4 wk
Gestational Age: 17.3 wk
Maternal Age at EDD: 29.5 a
Nuchal Translucency (NT): 1.2 mm
Nuchal Translucency MoM: 0.86
Number of Fetuses: 1
PAPP-A MoM: 2.14
PAPP-A Value: 2209.2 ng/mL
Test Results:: NEGATIVE
Weight: 138 [lb_av]
Weight: 138 [lb_av]
hCG MoM: 0.83
hCG Value: 26.2 [IU]/mL
uE3 MoM: 1.05

## 2021-06-22 ENCOUNTER — Other Ambulatory Visit: Payer: Self-pay

## 2021-06-22 ENCOUNTER — Ambulatory Visit (INDEPENDENT_AMBULATORY_CARE_PROVIDER_SITE_OTHER): Payer: Medicaid Other | Admitting: Obstetrics & Gynecology

## 2021-06-22 ENCOUNTER — Encounter: Payer: Self-pay | Admitting: Obstetrics & Gynecology

## 2021-06-22 ENCOUNTER — Ambulatory Visit (INDEPENDENT_AMBULATORY_CARE_PROVIDER_SITE_OTHER): Payer: Medicaid Other

## 2021-06-22 VITALS — BP 117/72 | HR 74 | Wt 139.2 lb

## 2021-06-22 DIAGNOSIS — O0992 Supervision of high risk pregnancy, unspecified, second trimester: Secondary | ICD-10-CM

## 2021-06-22 DIAGNOSIS — O10919 Unspecified pre-existing hypertension complicating pregnancy, unspecified trimester: Secondary | ICD-10-CM

## 2021-06-22 DIAGNOSIS — K5901 Slow transit constipation: Secondary | ICD-10-CM

## 2021-06-22 DIAGNOSIS — Z363 Encounter for antenatal screening for malformations: Secondary | ICD-10-CM | POA: Diagnosis not present

## 2021-06-22 DIAGNOSIS — Z3A19 19 weeks gestation of pregnancy: Secondary | ICD-10-CM

## 2021-06-22 LAB — POCT URINALYSIS DIPSTICK OB
Blood, UA: NEGATIVE
Glucose, UA: NEGATIVE
Ketones, UA: NEGATIVE
Leukocytes, UA: NEGATIVE
Nitrite, UA: NEGATIVE
POC,PROTEIN,UA: NEGATIVE

## 2021-06-22 MED ORDER — DOCUSATE SODIUM 100 MG PO CAPS: 100.0000 mg | ORAL_CAPSULE | Freq: Every day | ORAL | 2 refills | Status: AC

## 2021-06-22 NOTE — Progress Notes (Signed)
+  HIGH-RISK PREGNANCY VISIT Patient name: Melanie Gonzales MRN [id number]  Date of birth: Mar 29, 1991 Chief Complaint:   High Risk Gestation ([place] today; feels constipated; + cramps)  History of Present Illness:   Melanie Gonzales is a 31 y.o. G2P1001 female at [redacted]w[redacted]d with an Estimated Date of Delivery: 11/16/21 being seen today for ongoing management of a high-risk pregnancy complicated by:  -Chronic HTN- on Norvasc 2.5mg  daily.   -Constipation- not currently taking any medication.  She notes being prescribed medication in the past that made her sick.  Still having occasional nausea, denies vomiting.  Medication is still working well for her, but having to take it less often  Contractions: Not present. Vag. Bleeding: None.  Movement: Present. denies leaking of fluid.   Depression screen [facility] 2/9 05/05/2021 04/13/2021 11/19/2020 04/13/2020 09/12/2018  Decreased Interest 1 0 0 0 0  Down, Depressed, Hopeless 0 0 0 0 0  PHQ - 2 Score 1 0 0 0 0  Altered sleeping 0 - 0 - -  Tired, decreased energy 2 - 3 - -  Change in appetite 2 - 0 - -  Feeling bad or failure about yourself  0 - 1 - -  Trouble concentrating 0 - 0 - -  Moving slowly or fidgety/restless 0 - 0 - -  Suicidal thoughts 0 - 0 - -  PHQ-9 Score 5 - 4 - -     Current Outpatient Medications  Medication Instructions   amLODipine (NORVASC) 2.5 MG tablet TAKE 1 TABLET(2.5 MG) BY MOUTH DAILY   aspirin 162 mg, Oral, Daily, Swallow whole.   Blood Pressure Monitor MISC 1 each, Does not apply, Weekly   Doxylamine-Pyridoxine 10-10 MG TBEC 2 at bedtime on day 1 & 2; if symptoms persist, take 1 tablet in am and 2 tablets at bedtime on day 3; if symptoms persist, may increase to 1 tablet in am, 1 tablet afternoon, and 2 tablets at bedtime on day 4   ondansetron (ZOFRAN) 4 mg, Oral, Every 8 hours PRN   Prenatal Vit-Fe Fumarate-FA (PRENATAL VITAMIN PO) Oral     Review of Systems:   Pertinent items are noted in HPI Denies abnormal vaginal  discharge w/ itching/odor/irritation, headaches, visual changes, shortness of breath, chest pain, abdominal pain, severe nausea/vomiting, or problems with urination or bowel movements unless otherwise stated above. Pertinent History Reviewed:  Reviewed past medical,surgical, social, obstetrical and family history.  Reviewed problem list, medications and allergies. Physical Assessment:   Vitals:   06/22/21 0937  BP: 117/72  Pulse: 74  Weight: 139 lb 3.2 oz (63.1 kg)  Body mass index is 22.47 kg/m.           Physical Examination:   General appearance: alert, well appearing, and in no distress  Mental status: normal mood, behavior, speech, dress, motor activity, and thought processes  Skin: warm & dry   Extremities: Edema: None    Cardiovascular: normal heart rate noted  Respiratory: normal respiratory effort, no distress  Abdomen: gravid, soft, non-tender  Pelvic: Cervical exam deferred         Fetal Status:     Movement: Present     Fetal Surveillance Testing today: Anatomy scan today-  [place] 19 wks,breech,anterior placenta gr 0,normal right ovary,left ovary not visualized,cx 2.8 cm,svp of fluid 4.8 cm,FHR 164 bpm,EFW 304 g 80%,anatomy complete,no obvious abnormalities   Chaperone: N/A    Results for orders placed or performed in visit on 06/22/21 (from the past 24 hour(s))  POC Urinalysis Dipstick  OB   Collection Time: 06/22/21  9:32 AM  Result Value Ref Range   Color, UA     Clarity, UA     Glucose, UA Negative Negative   Bilirubin, UA     Ketones, UA neg    Spec Grav, UA     Blood, UA neg    pH, UA     POC,PROTEIN,UA Negative Negative, Trace, Small (1+), Moderate (2+), Large (3+), 4+   Urobilinogen, UA     Nitrite, UA neg    Leukocytes, UA Negative Negative   Appearance     Odor       Assessment & Plan:  High-risk pregnancy: G2P1001 at [redacted]w[redacted]d with an Estimated Date of Delivery: 11/16/21   1) chronic HTN- continue norvasc []  plan for growth q 4wks, starting around  26-28wks, will schedule next visit []  antepartum testing  2) Constipation -reviewed conservative options -Colace sent in  Meds: No orders of the defined types were placed in this encounter.   Labs/procedures today: Anatomy scan  Treatment Plan:  as outlined above  Reviewed: Preterm labor symptoms and general obstetric precautions including but not limited to vaginal bleeding, contractions, leaking of fluid and fetal movement were reviewed in detail with the patient.  All questions were answered. Pt has home bp cuff. Check bp weekly, let us know if >140/90.   Follow-up: Return in about 4 weeks (around 07/20/2021) for [place] visit.   Future Appointments  Date Time Provider [place]  06/22/2021 10:10 AM Janyth Pupa, DO [facility]  07/20/2021  2:50 PM Chancy Milroy, MD [facility]    Orders Placed This Encounter  Procedures   POC Urinalysis Dipstick OB    Janyth Pupa, DO Attending [place], [facility] for [employer], [place]

## 2021-07-01 ENCOUNTER — Encounter: Payer: Self-pay | Admitting: Obstetrics & Gynecology

## 2021-07-05 ENCOUNTER — Encounter: Payer: Self-pay | Admitting: Obstetrics & Gynecology

## 2021-07-20 ENCOUNTER — Encounter: Payer: Self-pay | Admitting: Obstetrics & Gynecology

## 2021-07-20 ENCOUNTER — Other Ambulatory Visit: Payer: Self-pay

## 2021-07-20 ENCOUNTER — Ambulatory Visit (INDEPENDENT_AMBULATORY_CARE_PROVIDER_SITE_OTHER): Payer: Medicaid Other | Admitting: Obstetrics & Gynecology

## 2021-07-20 VITALS — BP 116/71 | HR 74 | Wt 140.6 lb

## 2021-07-20 DIAGNOSIS — O0992 Supervision of high risk pregnancy, unspecified, second trimester: Secondary | ICD-10-CM

## 2021-07-20 DIAGNOSIS — O10919 Unspecified pre-existing hypertension complicating pregnancy, unspecified trimester: Secondary | ICD-10-CM

## 2021-07-20 DIAGNOSIS — I10 Essential (primary) hypertension: Secondary | ICD-10-CM

## 2021-07-20 NOTE — Progress Notes (Signed)
HIGH-RISK PREGNANCY VISIT Patient name: Melanie Gonzales MRN [id number]  Date of birth: 09-26-1990 Chief Complaint:   Routine Prenatal Visit  History of Present Illness:   Melanie Gonzales is a 31 y.o. G2P1001 female at [redacted]w[redacted]d with an Estimated Date of Delivery: 11/16/21 being seen today for ongoing management of a high-risk pregnancy complicated by: Chronic HTN- on Norvasc 2.5mg  daily.    Today she reports no complaints.   Contractions: Not present. Vag. Bleeding: None.  Movement: Present. denies leaking of fluid.   Depression screen [facility] 2/9 05/05/2021 04/13/2021 11/19/2020 04/13/2020 09/12/2018  Decreased Interest 1 0 0 0 0  Down, Depressed, Hopeless 0 0 0 0 0  PHQ - 2 Score 1 0 0 0 0  Altered sleeping 0 - 0 - -  Tired, decreased energy 2 - 3 - -  Change in appetite 2 - 0 - -  Feeling bad or failure about yourself  0 - 1 - -  Trouble concentrating 0 - 0 - -  Moving slowly or fidgety/restless 0 - 0 - -  Suicidal thoughts 0 - 0 - -  PHQ-9 Score 5 - 4 - -     Current Outpatient Medications  Medication Instructions   amLODipine (NORVASC) 2.5 MG tablet TAKE 1 TABLET(2.5 MG) BY MOUTH DAILY   aspirin 162 mg, Oral, Daily, Swallow whole.   Blood Pressure Monitor MISC 1 each, Does not apply, Weekly   docusate sodium (COLACE) 100 mg, Oral, Daily   Doxylamine-Pyridoxine 10-10 MG TBEC 2 at bedtime on day 1 & 2; if symptoms persist, take 1 tablet in am and 2 tablets at bedtime on day 3; if symptoms persist, may increase to 1 tablet in am, 1 tablet afternoon, and 2 tablets at bedtime on day 4   ondansetron (ZOFRAN) 4 mg, Oral, Every 8 hours PRN   Prenatal Vit-Fe Fumarate-FA (PRENATAL VITAMIN PO) Oral     Review of Systems:   Pertinent items are noted in HPI Denies abnormal vaginal discharge w/ itching/odor/irritation, headaches, visual changes, shortness of breath, chest pain, abdominal pain, severe nausea/vomiting, or problems with urination or bowel movements unless otherwise stated  above. Pertinent History Reviewed:  Reviewed past medical,surgical, social, obstetrical and family history.  Reviewed problem list, medications and allergies. Physical Assessment:   Vitals:   07/20/21 1451  BP: 116/71  Pulse: 74  Weight: 140 lb 9.6 oz (63.8 kg)  Body mass index is 22.69 kg/m.           Physical Examination:   General appearance: alert, well appearing, and in no distress  Mental status: normal mood, behavior, speech, dress, motor activity, and thought processes  Skin: warm & dry   Extremities: Edema: None    Cardiovascular: normal heart rate noted  Respiratory: normal respiratory effort, no distress  Abdomen: gravid, soft, non-tender  Pelvic: Cervical exam deferred         Fetal Status: Fetal Heart Rate (bpm): 150 Fundal Height: 22 cm Movement: Present     Chaperone: N/A    No results found for this or any previous visit (from the past 24 hour(s)).   Assessment & Plan:  High-risk pregnancy: G2P1001 at [redacted]w[redacted]d with an Estimated Date of Delivery: 11/16/21   1) cHTN- continue current meds -growth q 4wks -antepartum testing @ 32wks  Meds: No orders of the defined types were placed in this encounter.   Labs/procedures today: none  Treatment Plan:  PN-2 and growth next visit  Reviewed: Preterm labor symptoms and general obstetric precautions  including but not limited to vaginal bleeding, contractions, leaking of fluid and fetal movement were reviewed in detail with the patient.  All questions were answered.   Follow-up: Return in about 3 weeks (around 08/10/2021) for HROB visit, PN2 and growth scan.   Future Appointments  Date Time Provider Department Center  08/12/2021  9:10 AM [facility]  08/12/2021  9:45 AM [facility] [place] [facility] None  08/12/2021 10:50 AM [place], Amaryllis Dyke, MD [facility]    No orders of the defined types were placed in this encounter.   Myna Hidalgo, DO Attending Obstetrician & Gynecologist, [facility] for [employer], [facility]

## 2021-08-11 ENCOUNTER — Other Ambulatory Visit: Payer: Self-pay | Admitting: Obstetrics & Gynecology

## 2021-08-11 DIAGNOSIS — O10919 Unspecified pre-existing hypertension complicating pregnancy, unspecified trimester: Secondary | ICD-10-CM

## 2021-08-12 ENCOUNTER — Other Ambulatory Visit: Payer: Medicaid Other

## 2021-08-12 ENCOUNTER — Encounter: Payer: Self-pay | Admitting: Obstetrics & Gynecology

## 2021-08-12 ENCOUNTER — Ambulatory Visit (INDEPENDENT_AMBULATORY_CARE_PROVIDER_SITE_OTHER): Payer: Medicaid Other

## 2021-08-12 ENCOUNTER — Ambulatory Visit (INDEPENDENT_AMBULATORY_CARE_PROVIDER_SITE_OTHER): Payer: Medicaid Other | Admitting: Obstetrics & Gynecology

## 2021-08-12 ENCOUNTER — Other Ambulatory Visit: Payer: Self-pay

## 2021-08-12 VITALS — BP 107/71 | HR 79 | Wt 144.0 lb

## 2021-08-12 DIAGNOSIS — Z131 Encounter for screening for diabetes mellitus: Secondary | ICD-10-CM | POA: Diagnosis not present

## 2021-08-12 DIAGNOSIS — O0992 Supervision of high risk pregnancy, unspecified, second trimester: Secondary | ICD-10-CM

## 2021-08-12 DIAGNOSIS — Z3A26 26 weeks gestation of pregnancy: Secondary | ICD-10-CM | POA: Diagnosis not present

## 2021-08-12 DIAGNOSIS — I10 Essential (primary) hypertension: Secondary | ICD-10-CM

## 2021-08-12 DIAGNOSIS — O10919 Unspecified pre-existing hypertension complicating pregnancy, unspecified trimester: Secondary | ICD-10-CM | POA: Diagnosis not present

## 2021-08-12 NOTE — Progress Notes (Signed)
? ?HIGH-RISK PREGNANCY VISIT ?Patient name: Melanie Gonzales MRN [id number]  Date of birth: 02-Feb-1991 ?Chief Complaint:   ?Routine Prenatal Visit ([place] today) ? ?History of Present Illness:   ?Melanie Gonzales is a 31 y.o. G2P1001 female at [redacted]w[redacted]d with an Estimated Date of Delivery: 11/16/21 being seen today for ongoing management of a high-risk pregnancy complicated by chronic hypertension currently on Norvasc 2.5 mg daily + ASA 81 mg.   ? ?Today she reports no complaints. Contractions: Not present. Vag. Bleeding: None.  Movement: Present. denies leaking of fluid.  ? ?Depression screen [facility] 2/9 08/12/2021 05/05/2021 04/13/2021 11/19/2020 04/13/2020  ?Decreased Interest 1 1 0 0 0  ?Down, Depressed, Hopeless 0 0 0 0 0  ?PHQ - 2 Score 1 1 0 0 0  ?Altered sleeping 2 0 - 0 -  ?Tired, decreased energy 2 2 - 3 -  ?Change in appetite 1 2 - 0 -  ?Feeling bad or failure about yourself  0 0 - 1 -  ?Trouble concentrating 0 0 - 0 -  ?Moving slowly or fidgety/restless 0 0 - 0 -  ?Suicidal thoughts 0 0 - 0 -  ?PHQ-9 Score 6 5 - 4 -  ? ?  ?GAD 7 : Generalized Anxiety Score 08/12/2021 05/05/2021 11/19/2020  ?Nervous, Anxious, on Edge 0 1 1  ?Control/stop worrying 0 1 0  ?Worry too much - different things 1 1 1   ?Trouble relaxing 1 0 0  ?Restless 0 0 0  ?Easily annoyed or irritable 1 1 0  ?Afraid - awful might happen 0 0 0  ?Total GAD 7 Score 3 4 2   ? ? ? ?Review of Systems:   ?Pertinent items are noted in HPI ?Denies abnormal vaginal discharge w/ itching/odor/irritation, headaches, visual changes, shortness of breath, chest pain, abdominal pain, severe nausea/vomiting, or problems with urination or bowel movements unless otherwise stated above. ?Pertinent History Reviewed:  ?Reviewed past medical,surgical, social, obstetrical and family history.  ?Reviewed problem list, medications and allergies. ?Physical Assessment:  ? ?Vitals:  ? 08/12/21 1021  ?BP: 107/71  ?Pulse: 79  ?Weight: 144 lb (65.3 kg)  ?Body mass index is 23.24 kg/m?. ?     ?      Physical Examination:  ? General appearance: alert, well appearing, and in no distress ? Mental status: alert, oriented to person, place, and time ? Skin: warm & dry  ? Extremities: Edema: None  ?  Cardiovascular: normal heart rate noted ? Respiratory: normal respiratory effort, no distress ? Abdomen: gravid, soft, non-tender ? Pelvic: Cervical exam deferred        ? ?Fetal Status:     Movement: Present   ? ?Fetal Surveillance Testing today: Sonogram normal  ? ?Chaperone: N/A   ? ?No results found for this or any previous visit (from the past 24 hour(s)).  ?Assessment & Plan:  ?High-risk pregnancy: G2P1001 at [redacted]w[redacted]d with an Estimated Date of Delivery: 11/16/21  ? ?1) CHTN on Norvasc 2.5 mg daily, stable  ? ? ? ?Meds: No orders of the defined types were placed in this encounter. ? ? ?Labs/procedures today: U/S ? ?Treatment Plan:  Routine care for Chronic hypertension ? ?Reviewed: Preterm labor symptoms and general obstetric precautions including but not limited to vaginal bleeding, contractions, leaking of fluid and fetal movement were reviewed in detail with the patient.  All questions were answered. Does have home bp cuff. Office bp cuff given: not applicable. Check bp daily, let [redacted]w[redacted]d know if consistently >140 and/or >90. ? ?Follow-up: No  follow-ups on file. ? ? ?Future Appointments  ?Date Time Provider Department Center  ?08/12/2021 10:50 AM Mckinzi Eriksen, Amaryllis Dyke, MD [facility]  ? ? ?No orders of the defined types were placed in this encounter. ? ?Lazaro Arms ?08/12/2021 ?10:48 AM ? ?

## 2021-08-12 NOTE — Progress Notes (Signed)
[place] [id number] wks,cephalic,anterior placenta gr 1,cx 2.9 cm,normal ovaries,AFI 14 cm,FHR 154 bpm,EFW 1039 g  75%, ?

## 2021-08-13 ENCOUNTER — Encounter: Payer: Self-pay | Admitting: Women's Health

## 2021-08-13 LAB — CBC
Hematocrit: 31.4 % — ABNORMAL LOW (ref 34.0–46.6)
Hemoglobin: 10.5 g/dL — ABNORMAL LOW (ref 11.1–15.9)
MCH: 32.5 pg (ref 26.6–33.0)
MCHC: 33.4 g/dL (ref 31.5–35.7)
MCV: 97 fL (ref 79–97)
Platelets: 310 10*3/uL (ref 150–450)
RBC: 3.23 x10E6/uL — ABNORMAL LOW (ref 3.77–5.28)
RDW: 12.7 % (ref 11.7–15.4)
WBC: 8.2 10*3/uL (ref 3.4–10.8)

## 2021-08-13 LAB — ANTIBODY SCREEN: Antibody Screen: NEGATIVE

## 2021-08-13 LAB — GLUCOSE TOLERANCE, 2 HOURS W/ 1HR
Glucose, 1 hour: 107 mg/dL (ref 70–179)
Glucose, 2 hour: 69 mg/dL — ABNORMAL LOW (ref 70–152)
Glucose, Fasting: 74 mg/dL (ref 70–91)

## 2021-08-13 LAB — RPR: RPR Ser Ql: NONREACTIVE

## 2021-08-13 LAB — HIV ANTIBODY (ROUTINE TESTING W REFLEX): HIV Screen 4th Generation wRfx: NONREACTIVE

## 2021-09-02 ENCOUNTER — Encounter: Payer: Self-pay | Admitting: Obstetrics & Gynecology

## 2021-09-02 ENCOUNTER — Ambulatory Visit (INDEPENDENT_AMBULATORY_CARE_PROVIDER_SITE_OTHER): Payer: Medicaid Other | Admitting: Obstetrics & Gynecology

## 2021-09-02 VITALS — BP 102/58 | HR 81 | Wt 147.0 lb

## 2021-09-02 DIAGNOSIS — R768 Other specified abnormal immunological findings in serum: Secondary | ICD-10-CM

## 2021-09-02 DIAGNOSIS — O0992 Supervision of high risk pregnancy, unspecified, second trimester: Secondary | ICD-10-CM

## 2021-09-02 DIAGNOSIS — I10 Essential (primary) hypertension: Secondary | ICD-10-CM

## 2021-09-02 MED ORDER — PRENATAL VITAMIN 27-0.8 MG PO TABS: 1.0000 | ORAL_TABLET | Freq: Every day | ORAL | 11 refills | Status: DC

## 2021-09-02 NOTE — Progress Notes (Signed)
? ? ?HIGH-RISK PREGNANCY VISIT ?Patient name: Melanie Gonzales MRN [id number]  Date of birth: [birth year] ?Chief Complaint:   ?Routine Prenatal Visit ? ?History of Present Illness:   ?Melanie Gonzales is a 31 y.o. G2P1001 female at [redacted]w[redacted]d with an Estimated Date of Delivery: 11/16/21 being seen today for ongoing management of a high-risk pregnancy complicated by [facility].   ? ?Today she reports no complaints. Contractions: Not present. Vag. Bleeding: None.  Movement: Present. denies leaking of fluid.  ? ? ?  08/12/2021  ? 10:30 AM 05/05/2021  ?  8:37 AM 04/13/2021  ?  4:15 PM 11/19/2020  ?  1:32 PM 04/13/2020  ?  2:18 PM  ?Depression screen PHQ 2/9  ?Decreased Interest 1 1 0 0 0  ?Down, Depressed, Hopeless 0 0 0 0 0  ?PHQ - 2 Score 1 1 0 0 0  ?Altered sleeping 2 0  0   ?Tired, decreased energy 2 2  3    ?Change in appetite 1 2  0   ?Feeling bad or failure about yourself  0 0  1   ?Trouble concentrating 0 0  0   ?Moving slowly or fidgety/restless 0 0  0   ?Suicidal thoughts 0 0  0   ?PHQ-9 Score 6 5  4    ? ?  ? ?  08/12/2021  ? 10:31 AM 05/05/2021  ?  8:37 AM 11/19/2020  ?  1:32 PM  ?GAD 7 : Generalized Anxiety Score  ?Nervous, Anxious, on Edge 0 1 1  ?Control/stop worrying 0 1 0  ?Worry too much - different things 1 1 1   ?Trouble relaxing 1 0 0  ?Restless 0 0 0  ?Easily annoyed or irritable 1 1 0  ?Afraid - awful might happen 0 0 0  ?Total GAD 7 Score 3 4 2   ? ? ? ?Review of Systems:   ?Pertinent items are noted in HPI ?Denies abnormal vaginal discharge w/ itching/odor/irritation, headaches, visual changes, shortness of breath, chest pain, abdominal pain, severe nausea/vomiting, or problems with urination or bowel movements unless otherwise stated above. ?Pertinent History Reviewed:  ?Reviewed past medical,surgical, social, obstetrical and family history.  ?Reviewed problem list, medications and allergies. ?Physical Assessment:  ? ?Vitals:  ? 09/02/21 1400  ?BP: (!) 102/58  ?Pulse: 81  ?Weight: 147 lb (66.7 kg)  ?Body mass index  is 23.73 kg/m?. ?     ?     Physical Examination:  ? General appearance: alert, well appearing, and in no distress ? Mental status: alert, oriented to person, place, and time ? Skin: warm & dry  ? Extremities: Edema: None  ?  Cardiovascular: normal heart rate noted ? Respiratory: normal respiratory effort, no distress ? Abdomen: gravid, soft, non-tender ? Pelvic: Cervical exam deferred        ? ?Fetal Status:     Movement: Present   ? ?Fetal Surveillance Testing today: FHR 150  ? ?Chaperone: N/A   ? ?No results found for this or any previous visit (from the past 24 hour(s)).  ?Assessment & Plan:  ?High-risk pregnancy: G2P1001 at [redacted]w[redacted]d with an Estimated Date of Delivery: 11/16/21  ? ? ?  ICD-10-CM   ?1. Supervision of high risk pregnancy in second trimester  O09.92   ? due to chronic hypertension  ?  ?2. Chronic hypertension  I10   ? on Norvasc 2.5 mg daily: good control, continue.  also on ASA 162  ?  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 2014, prophylaxis at 82  weeks  ?  ?  ? ?Meds:  ?Meds ordered this encounter  ?Medications  ? Prenatal Vit-Fe Fumarate-FA (PRENATAL VITAMIN) 27-0.8 MG TABS  ?  Sig: Take 1 tablet by mouth daily.  ?  Dispense:  100 tablet  ?  Refill:  11  ? ? ?Orders: No orders of the defined types were placed in this encounter. ?  ? ?Labs/procedures today: none ? ?Treatment Plan:  twice weekly beginning at 32 weeks ? ?Reviewed: PreTerm labor symptoms and general obstetric precautions including but not limited to vaginal bleeding, contractions, leaking of fluid and fetal movement were reviewed in detail with the patient.  All questions were answered. Does have home bp cuff. Office bp cuff given: not applicable. Check bp twice daily, let us know if consistently >150 and/or >95. ? ?Follow-up: No follow-ups on file. ? ? ?No future appointments. ? ?No orders of the defined types were placed in this encounter. ? ?Florian Buff  ?Attending Physician for the Center for [place] ?[facility] ?09/02/2021 ?2:18 PM ? ?

## 2021-09-07 ENCOUNTER — Encounter: Payer: Self-pay | Admitting: Obstetrics & Gynecology

## 2021-09-22 ENCOUNTER — Other Ambulatory Visit: Payer: Self-pay | Admitting: Obstetrics & Gynecology

## 2021-09-22 DIAGNOSIS — O10919 Unspecified pre-existing hypertension complicating pregnancy, unspecified trimester: Secondary | ICD-10-CM

## 2021-09-23 ENCOUNTER — Ambulatory Visit (INDEPENDENT_AMBULATORY_CARE_PROVIDER_SITE_OTHER): Payer: Medicaid Other | Admitting: Obstetrics & Gynecology

## 2021-09-23 ENCOUNTER — Encounter: Payer: Self-pay | Admitting: Obstetrics & Gynecology

## 2021-09-23 ENCOUNTER — Ambulatory Visit (INDEPENDENT_AMBULATORY_CARE_PROVIDER_SITE_OTHER): Payer: Medicaid Other

## 2021-09-23 VITALS — BP 112/70 | HR 85 | Wt 148.0 lb

## 2021-09-23 DIAGNOSIS — Z23 Encounter for immunization: Secondary | ICD-10-CM

## 2021-09-23 DIAGNOSIS — Z3A32 32 weeks gestation of pregnancy: Secondary | ICD-10-CM

## 2021-09-23 DIAGNOSIS — R768 Other specified abnormal immunological findings in serum: Secondary | ICD-10-CM

## 2021-09-23 DIAGNOSIS — O10919 Unspecified pre-existing hypertension complicating pregnancy, unspecified trimester: Secondary | ICD-10-CM | POA: Diagnosis not present

## 2021-09-23 DIAGNOSIS — O0993 Supervision of high risk pregnancy, unspecified, third trimester: Secondary | ICD-10-CM | POA: Diagnosis not present

## 2021-09-23 LAB — POCT URINALYSIS DIPSTICK OB
Blood, UA: NEGATIVE
Glucose, UA: NEGATIVE
Ketones, UA: NEGATIVE
Leukocytes, UA: NEGATIVE
Nitrite, UA: NEGATIVE

## 2021-09-23 MED ORDER — ACYCLOVIR 400 MG PO TABS: 400.0000 mg | ORAL_TABLET | Freq: Three times a day (TID) | ORAL | 2 refills | Status: DC

## 2021-09-23 NOTE — Progress Notes (Signed)
? ? ?HIGH-RISK PREGNANCY VISIT ?Patient name: Melanie Gonzales MRN [id number]  Date of birth: [birth year] ?Chief Complaint:   ?Routine Prenatal Visit ? ?History of Present Illness:   ?Melanie Gonzales is a 31 y.o. G2P1001 female at [redacted]w[redacted]d with an Estimated Date of Delivery: 11/16/21 being seen today for ongoing management of a high-risk pregnancy complicated by chronic hypertension currently on norvasc 2.5 mg daily.   ? ?Today she reports no complaints. Contractions: Irritability. Vag. Bleeding: None.  Movement: Present. denies leaking of fluid.  ? ? ?  08/12/2021  ? 10:30 AM 05/05/2021  ?  8:37 AM 04/13/2021  ?  4:15 PM 11/19/2020  ?  1:32 PM 04/13/2020  ?  2:18 PM  ?Depression screen PHQ 2/9  ?Decreased Interest 1 1 0 0 0  ?Down, Depressed, Hopeless 0 0 0 0 0  ?PHQ - 2 Score 1 1 0 0 0  ?Altered sleeping 2 0  0   ?Tired, decreased energy 2 2  3    ?Change in appetite 1 2  0   ?Feeling bad or failure about yourself  0 0  1   ?Trouble concentrating 0 0  0   ?Moving slowly or fidgety/restless 0 0  0   ?Suicidal thoughts 0 0  0   ?PHQ-9 Score 6 5  4    ? ?  ? ?  08/12/2021  ? 10:31 AM 05/05/2021  ?  8:37 AM 11/19/2020  ?  1:32 PM  ?GAD 7 : Generalized Anxiety Score  ?Nervous, Anxious, on Edge 0 1 1  ?Control/stop worrying 0 1 0  ?Worry too much - different things 1 1 1   ?Trouble relaxing 1 0 0  ?Restless 0 0 0  ?Easily annoyed or irritable 1 1 0  ?Afraid - awful might happen 0 0 0  ?Total GAD 7 Score 3 4 2   ? ? ? ?Review of Systems:   ?Pertinent items are noted in HPI ?Denies abnormal vaginal discharge w/ itching/odor/irritation, headaches, visual changes, shortness of breath, chest pain, abdominal pain, severe nausea/vomiting, or problems with urination or bowel movements unless otherwise stated above. ?Pertinent History Reviewed:  ?Reviewed past medical,surgical, social, obstetrical and family history.  ?Reviewed problem list, medications and allergies. ?Physical Assessment:  ? ?Vitals:  ? 09/23/21 1505  ?BP: 112/70  ?Pulse:  85  ?Weight: 148 lb (67.1 kg)  ?Body mass index is 23.89 kg/m?. ?     ?     Physical Examination:  ? General appearance: alert, well appearing, and in no distress ? Mental status: alert, oriented to person, place, and time ? Skin: warm & dry  ? Extremities: Edema: None  ?  Cardiovascular: normal heart rate noted ? Respiratory: normal respiratory effort, no distress ? Abdomen: gravid, soft, non-tender ? Pelvic: Cervical exam deferred        ? ?Fetal Status:     Movement: Present   ? ?Fetal Surveillance Testing today: BPP 8/8 with 55% Dopplers  ? ?Chaperone: N/A   ? ?Results for orders placed or performed in visit on 09/23/21 (from the past 24 hour(s))  ?POC Urinalysis Dipstick OB  ? Collection Time: 09/23/21  3:08 PM  ?Result Value Ref Range  ? Color, UA    ? Clarity, UA    ? Glucose, UA Negative Negative  ? Bilirubin, UA    ? Ketones, UA neg   ? Spec Grav, UA    ? Blood, UA neg   ? pH, UA    ? POC,PROTEIN,UA Trace Negative, Trace,  Small (1+), Moderate (2+), Large (3+), 4+  ? Urobilinogen, UA    ? Nitrite, UA neg   ? Leukocytes, UA Negative Negative  ? Appearance    ? Odor    ?  ?Assessment & Plan:  ?High-risk pregnancy: G2P1001 at [redacted]w[redacted]d with an Estimated Date of Delivery: 11/16/21  ? ? ?  ICD-10-CM   ?1. Supervision of high risk pregnancy in third trimester  O09.93 Tdap vaccine greater than or equal to 7yo IM  ?  ?2. Chronic hypertension affecting pregnancy  O10.919 POC Urinalysis Dipstick OB  ? on norvasc 2.5 mg daily, reassuring surveillance today  ?  ?3. [redacted] weeks gestation of pregnancy  Z3A.32 Tdap vaccine greater than or equal to 7yo IM  ?  ?4. 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 Rx acyclovir today, she can start now or 34 weeks  ?  ?  ? ? ?Meds:  ?Meds ordered this encounter  ?Medications  ? acyclovir (ZOVIRAX) 400 MG tablet  ?  Sig: Take 1 tablet (400 mg total) by mouth 3 (three) times daily.  ?  Dispense:  90 tablet  ?  Refill:  2  ? ? ?Orders:  ?Orders Placed This Encounter  ?Procedures  ? Tdap vaccine greater than  or equal to 7yo IM  ? POC Urinalysis Dipstick OB  ?  ? ?Labs/procedures today: U/S ? ?Treatment Plan:  twice weekly surveillance  ? ?Reviewed: Preterm labor symptoms and general obstetric precautions including but not limited to vaginal bleeding, contractions, leaking of fluid and fetal movement were reviewed in detail with the patient.  All questions were answered. Does have home bp cuff. Office bp cuff given: not applicable. Check bp daily, let us know if consistently >150 and/or >95. ? ?Follow-up: Return in about 5 days (around 09/28/2021) for sert up twice weekly appts: TUES/FRI requested;  NST alternating with sonogram, NST, Nurse only. ? ? ?No future appointments. ? ?Orders Placed This Encounter  ?Procedures  ? Tdap vaccine greater than or equal to 7yo IM  ? POC Urinalysis Dipstick OB  ? ?[place]  ?Attending Physician for the Center for [place] ?[facility] ?09/23/2021 ?3:23 PM ? ?

## 2021-09-23 NOTE — Progress Notes (Signed)
[place] 32+2 wks,cephalic,anterior placenta gr 2,FHR 152 bpm,AFI 12 cm,RI .53,.62,.60,.63=55%,BPP 8/8,EFW 2049 g 55% ?

## 2021-09-28 ENCOUNTER — Ambulatory Visit (INDEPENDENT_AMBULATORY_CARE_PROVIDER_SITE_OTHER): Payer: Medicaid Other | Admitting: *Deleted

## 2021-09-28 ENCOUNTER — Encounter: Payer: Self-pay | Admitting: Obstetrics & Gynecology

## 2021-09-28 VITALS — BP 115/68 | HR 82 | Wt 149.4 lb

## 2021-09-28 DIAGNOSIS — Z1389 Encounter for screening for other disorder: Secondary | ICD-10-CM

## 2021-09-28 DIAGNOSIS — O0993 Supervision of high risk pregnancy, unspecified, third trimester: Secondary | ICD-10-CM

## 2021-09-28 DIAGNOSIS — O288 Other abnormal findings on antenatal screening of mother: Secondary | ICD-10-CM | POA: Diagnosis not present

## 2021-09-28 DIAGNOSIS — Z331 Pregnant state, incidental: Secondary | ICD-10-CM

## 2021-09-28 DIAGNOSIS — Z3A33 33 weeks gestation of pregnancy: Secondary | ICD-10-CM

## 2021-09-28 DIAGNOSIS — O10919 Unspecified pre-existing hypertension complicating pregnancy, unspecified trimester: Secondary | ICD-10-CM | POA: Diagnosis not present

## 2021-09-28 LAB — POCT URINALYSIS DIPSTICK OB
Blood, UA: NEGATIVE
Glucose, UA: NEGATIVE
Leukocytes, UA: NEGATIVE
Nitrite, UA: NEGATIVE
POC,PROTEIN,UA: NEGATIVE

## 2021-09-28 NOTE — Progress Notes (Signed)
? ?  NURSE VISIT- NST ? ?SUBJECTIVE:  ?Melanie Gonzales is a 31 y.o. G2P1001 female at [redacted]w[redacted]d, here for a NST for pregnancy complicated by [facility].  She reports active fetal movement, contractions: none, vaginal bleeding: none, membranes: intact.  ? ?OBJECTIVE:  ?BP 115/68   Pulse 82   Wt 149 lb 6.4 oz (67.8 kg)   LMP  (LMP Unknown)   BMI 24.11 kg/m?   ?Appears well, no apparent distress ? ?Results for orders placed or performed in visit on 09/28/21 (from the past 24 hour(s))  ?POC Urinalysis Dipstick OB  ? Collection Time: 09/28/21  4:10 PM  ?Result Value Ref Range  ? Color, UA    ? Clarity, UA    ? Glucose, UA Negative Negative  ? Bilirubin, UA    ? Ketones, UA small   ? Spec Grav, UA    ? Blood, UA neg   ? pH, UA    ? POC,PROTEIN,UA Negative Negative, Trace, Small (1+), Moderate (2+), Large (3+), 4+  ? Urobilinogen, UA    ? Nitrite, UA neg   ? Leukocytes, UA Negative Negative  ? Appearance    ? Odor    ? ? ?NST: FHR baseline 140 bpm, Variability: moderate, Accelerations:present, Decelerations:  Absent= Cat 1/reactive ?Toco: none  ? ?ASSESSMENT: ?G2P1001 at [redacted]w[redacted]d with CHTN ?NST reactive ? ?PLAN: ?EFM strip reviewed by Dr. Despina Hidden   ?Recommendations: keep next appointment as scheduled   ? ?Melanie Gonzales  ?09/28/2021 ?4:42 PM ? ?

## 2021-09-29 ENCOUNTER — Encounter: Payer: Self-pay | Admitting: *Deleted

## 2021-09-29 DIAGNOSIS — Z029 Encounter for administrative examinations, unspecified: Secondary | ICD-10-CM

## 2021-10-01 ENCOUNTER — Ambulatory Visit (INDEPENDENT_AMBULATORY_CARE_PROVIDER_SITE_OTHER): Payer: Medicaid Other | Admitting: Obstetrics & Gynecology

## 2021-10-01 ENCOUNTER — Encounter: Payer: Self-pay | Admitting: Obstetrics & Gynecology

## 2021-10-01 ENCOUNTER — Other Ambulatory Visit: Payer: Medicaid Other

## 2021-10-01 VITALS — BP 115/68 | HR 84 | Wt 148.8 lb

## 2021-10-01 DIAGNOSIS — O0993 Supervision of high risk pregnancy, unspecified, third trimester: Secondary | ICD-10-CM

## 2021-10-01 LAB — POCT URINALYSIS DIPSTICK OB
Blood, UA: NEGATIVE
Glucose, UA: NEGATIVE
Ketones, UA: NEGATIVE
Leukocytes, UA: NEGATIVE
Nitrite, UA: NEGATIVE
POC,PROTEIN,UA: NEGATIVE

## 2021-10-01 NOTE — Progress Notes (Signed)
? ?HIGH-RISK PREGNANCY VISIT ?Patient name: Kerianne Stoke MRN [id number]  Date of birth: 1990/06/24 ?Chief Complaint:   ?Routine Prenatal Visit and Non-stress Test ? ?History of Present Illness:   ?Giavanni Kash is a 31 y.o. 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   ? ?Today she reports occasional contractions.  ? ?Contractions: Irritability. Vag. Bleeding: None.  Movement: Present. denies leaking of fluid.  ? ? ?  08/12/2021  ? 10:30 AM 05/05/2021  ?  8:37 AM 04/13/2021  ?  4:15 PM 11/19/2020  ?  1:32 PM 04/13/2020  ?  2:18 PM  ?Depression screen PHQ 2/9  ?Decreased Interest 1 1 0 0 0  ?Down, Depressed, Hopeless 0 0 0 0 0  ?PHQ - 2 Score 1 1 0 0 0  ?Altered sleeping 2 0  0   ?Tired, decreased energy 2 2  3    ?Change in appetite 1 2  0   ?Feeling bad or failure about yourself  0 0  1   ?Trouble concentrating 0 0  0   ?Moving slowly or fidgety/restless 0 0  0   ?Suicidal thoughts 0 0  0   ?PHQ-9 Score 6 5  4    ? ? ? ?Current Outpatient Medications  ?Medication Instructions  ? acyclovir (ZOVIRAX) 400 mg, Oral, 3 times daily  ? amLODipine (NORVASC) 2.5 MG tablet TAKE 1 TABLET(2.5 MG) BY MOUTH DAILY  ? aspirin 162 mg, Oral, Daily, Swallow whole.  ? Blood Pressure Monitor MISC 1 each, Does not apply, Weekly  ? Doxylamine-Pyridoxine 10-10 MG TBEC 2 at bedtime on day 1 & 2; if symptoms persist, take 1 tablet in am and 2 tablets at bedtime on day 3; if symptoms persist, may increase to 1 tablet in am, 1 tablet afternoon, and 2 tablets at bedtime on day 4  ? ondansetron (ZOFRAN) 4 mg, Oral, Every 8 hours PRN  ? Prenatal Vit-Fe Fumarate-FA (PRENATAL VITAMIN) 27-0.8 MG TABS 1 tablet, Oral, Daily  ?  ? ?Review of Systems:   ?Pertinent items are noted in HPI ?Denies abnormal vaginal discharge w/ itching/odor/irritation, headaches, visual changes, shortness of  breath, chest pain, abdominal pain, severe nausea/vomiting, or problems with urination or bowel movements unless otherwise stated above. ?Pertinent History Reviewed:  ?Reviewed past medical,surgical, social, obstetrical and family history.  ?Reviewed problem list, medications and allergies. ?Physical Assessment:  ? ?Vitals:  ? 10/01/21 0840  ?BP: 115/68  ?Pulse: 84  ?Weight: 148 lb 12.8 oz (67.5 kg)  ?Body mass index is 24.02 kg/m?. ?     ?     Physical Examination:  ? General appearance: alert, well appearing, and in no distress ? Mental status: normal mood, behavior, speech, dress, motor activity, and thought processes ? Skin: warm & dry  ? Extremities: Edema: None  ?  Cardiovascular: normal heart rate noted ? Respiratory: normal respiratory effort, no distress ? Abdomen: gravid, soft, non-tender ? Pelvic: Cervical exam deferred        ? ?Fetal Status: Fetal Heart Rate (bpm): 140 Fundal Height: 31 cm Movement: Present   ? ?Fetal Surveillance Testing today: NST ? ?NST being performed due to cHTN ? ? ?Fetal Monitoring:  Baseline: 140 bpm, Variability: moderate, Accelerations: present, The accelerations are >15 bpm and more than 2 in 20 minutes, and Decelerations: Absent   ? ? ?Final diagnosis:   Reactive NST ? ? ?  ? ?  Chaperone: N/A   ? ?Results for orders placed or performed in visit on 10/01/21 (from the past 24 hour(s))  ?POC Urinalysis Dipstick OB  ? Collection Time: 10/01/21  8:46 AM  ?Result Value Ref Range  ? Color, UA    ? Clarity, UA    ? Glucose, UA Negative Negative  ? Bilirubin, UA    ? Ketones, UA neg   ? Spec Grav, UA    ? Blood, UA neg   ? pH, UA    ? POC,PROTEIN,UA Negative Negative, Trace, Small (1+), Moderate (2+), Large (3+), 4+  ? Urobilinogen, UA    ? Nitrite, UA neg   ? Leukocytes, UA Negative Negative  ? Appearance    ? Odor    ?  ? ?Assessment & Plan:  ?High-risk pregnancy: G2P1001 at [redacted]w[redacted]d with an Estimated Date of Delivery: 11/16/21  ? ?1) cHTN- continue current meds ?-antepartum testing  twice weekly ?-plan for IOL 37-38wks ?-reviewed preeclampsia precautions ? ?2) HSV on suppression ? ?Meds: No orders of the defined types were placed in this encounter. ? ? ?Labs/procedures today: NST ? ?Treatment Plan:  as outlined above ? ?Reviewed: Preterm labor symptoms and general obstetric precautions including but not limited to vaginal bleeding, contractions, leaking of fluid and fetal movement were reviewed in detail with the patient.  All questions were answered. Pt has home bp cuff. Check bp weekly, let us know if >140/90.  ? ?Follow-up: Return for as scheduled. ? ? ?Future Appointments  ?Date Time Provider [place]  ?10/01/2021  9:30 AM [facility] NURSE [facility]  ?10/05/2021  3:00 PM [place] - [facility]TO[facility]YN [place] [facility] None  ?10/05/2021  3:50 PM Florian Buff, MD [facility]  ?10/08/2021  9:30 AM [facility] NURSE [facility]  ?10/12/2021  3:30 PM [facility] NURSE [facility]  ?10/12/2021  3:50 PM Roma Schanz, CNM [facility]  ?10/15/2021  9:30 AM [facility] NURSE [facility]  ?10/19/2021  3:00 PM [place] - FTOBGYN [place] [facility] None  ?10/19/2021  3:50 PM Roma Schanz, CNM [facility]  ?10/22/2021 11:30 AM [facility] NURSE [facility]  ?10/26/2021  2:45 PM [place] - [facility]TOBGYN [place] [facility] None  ?10/26/2021  4:10 PM Florian Buff, MD [facility]  ?10/29/2021 11:50 AM [facility] NURSE [facility]  ?11/02/2021  3:15 PM [facility] - [facility] [place] [facility] None  ?11/02/2021  4:10 PM Florian Buff, MD [facility]  ?11/05/2021  9:50 AM [facility] NURSE [facility]  ?11/09/2021  2:45 PM [facility] - [facility] [place] [facility] None  ?11/09/2021  3:30 PM Roma Schanz, CNM [facility]  ?11/12/2021  9:30 AM [facility] NURSE [facility]  ?11/16/2021  3:30 PM [facility] NURSE [facility]  ?11/16/2021  3:50 PM Roma Schanz, CNM [facility]  ? ? ?Orders Placed This Encounter  ?Procedures  ? POC Urinalysis Dipstick OB  ? ? ?Janyth Pupa, DO ?Attending [place], Faculty  Practice ?[facility] [place] ? ? ? ?

## 2021-10-04 ENCOUNTER — Other Ambulatory Visit: Payer: Self-pay | Admitting: Obstetrics & Gynecology

## 2021-10-04 DIAGNOSIS — O10919 Unspecified pre-existing hypertension complicating pregnancy, unspecified trimester: Secondary | ICD-10-CM

## 2021-10-05 ENCOUNTER — Ambulatory Visit (INDEPENDENT_AMBULATORY_CARE_PROVIDER_SITE_OTHER): Payer: Medicaid Other

## 2021-10-05 ENCOUNTER — Ambulatory Visit (INDEPENDENT_AMBULATORY_CARE_PROVIDER_SITE_OTHER): Payer: Medicaid Other | Admitting: Obstetrics & Gynecology

## 2021-10-05 ENCOUNTER — Encounter: Payer: Self-pay | Admitting: Obstetrics & Gynecology

## 2021-10-05 VITALS — BP 117/80 | HR 86 | Wt 148.0 lb

## 2021-10-05 DIAGNOSIS — O10919 Unspecified pre-existing hypertension complicating pregnancy, unspecified trimester: Secondary | ICD-10-CM

## 2021-10-05 DIAGNOSIS — Z3A34 34 weeks gestation of pregnancy: Secondary | ICD-10-CM | POA: Diagnosis not present

## 2021-10-05 DIAGNOSIS — G44209 Tension-type headache, unspecified, not intractable: Secondary | ICD-10-CM

## 2021-10-05 DIAGNOSIS — O0993 Supervision of high risk pregnancy, unspecified, third trimester: Secondary | ICD-10-CM

## 2021-10-05 LAB — POCT URINALYSIS DIPSTICK OB
Blood, UA: NEGATIVE
Glucose, UA: NEGATIVE
Ketones, UA: NEGATIVE
Leukocytes, UA: NEGATIVE
Nitrite, UA: NEGATIVE

## 2021-10-05 NOTE — Progress Notes (Signed)
[place] 34 wks,cephalic,FHR 169 bpm,anterior placenta gr 2,afi 11.2 cm,BPP 8/8,limited UAD because of continuous practice breathing, RI .45,.51,.47=5.2%  ?

## 2021-10-05 NOTE — Progress Notes (Signed)
? ? ?HIGH-RISK PREGNANCY VISIT ?Patient name: Melanie Gonzales MRN [id number]  Date of birth: [birth year] ?Chief Complaint:   ?Routine Prenatal Visit, High Risk Gestation, and Pregnancy Ultrasound ? ?History of Present Illness:   ?Melanie Gonzales is a 31 y.o. G2P1001 female at [redacted]w[redacted]d with an Estimated Date of Delivery: 11/16/21 being seen today for ongoing management of a high-risk pregnancy complicated by chronic hypertension currently on norvasc 2.5 mg.   ? ?Today she reports episodic headaches, mostly above the right eye, not migrainoid. Contractions: Irritability. Vag. Bleeding: None.  Movement: Present. denies leaking of fluid.  ? ? ?  08/12/2021  ? 10:30 AM 05/05/2021  ?  8:37 AM 04/13/2021  ?  4:15 PM 11/19/2020  ?  1:32 PM 04/13/2020  ?  2:18 PM  ?Depression screen PHQ 2/9  ?Decreased Interest 1 1 0 0 0  ?Down, Depressed, Hopeless 0 0 0 0 0  ?PHQ - 2 Score 1 1 0 0 0  ?Altered sleeping 2 0  0   ?Tired, decreased energy 2 2  3    ?Change in appetite 1 2  0   ?Feeling bad or failure about yourself  0 0  1   ?Trouble concentrating 0 0  0   ?Moving slowly or fidgety/restless 0 0  0   ?Suicidal thoughts 0 0  0   ?PHQ-9 Score 6 5  4    ? ?  ? ?  08/12/2021  ? 10:31 AM 05/05/2021  ?  8:37 AM 11/19/2020  ?  1:32 PM  ?GAD 7 : Generalized Anxiety Score  ?Nervous, Anxious, on Edge 0 1 1  ?Control/stop worrying 0 1 0  ?Worry too much - different things 1 1 1   ?Trouble relaxing 1 0 0  ?Restless 0 0 0  ?Easily annoyed or irritable 1 1 0  ?Afraid - awful might happen 0 0 0  ?Total GAD 7 Score 3 4 2   ? ? ? ?Review of Systems:   ?Pertinent items are noted in HPI ?Denies abnormal vaginal discharge w/ itching/odor/irritation, headaches, visual changes, shortness of breath, chest pain, abdominal pain, severe nausea/vomiting, or problems with urination or bowel movements unless otherwise stated above. ?Pertinent History Reviewed:  ?Reviewed past medical,surgical, social, obstetrical and family history.  ?Reviewed problem list,  medications and allergies. ?Physical Assessment:  ? ?Vitals:  ? 10/05/21 1559  ?BP: 117/80  ?Pulse: 86  ?Weight: 148 lb (67.1 kg)  ?Body mass index is 23.89 kg/m?. ?     ?     Physical Examination:  ? General appearance: alert, well appearing, and in no distress ? Mental status: alert, oriented to person, place, and time ? Skin: warm & dry  ? Extremities: Edema: None  ?  Cardiovascular: normal heart rate noted ? Respiratory: normal respiratory effort, no distress ? Abdomen: gravid, soft, non-tender ? Pelvic: Cervical exam deferred        ? ?Fetal Status:     Movement: Present   ? ?Fetal Surveillance Testing today: BPP 8/8  ? ?Chaperone: N/A   ? ?Results for orders placed or performed in visit on 10/05/21 (from the past 24 hour(s))  ?POC Urinalysis Dipstick OB  ? Collection Time: 10/05/21  4:12 PM  ?Result Value Ref Range  ? Color, UA    ? Clarity, UA    ? Glucose, UA Negative Negative  ? Bilirubin, UA    ? Ketones, UA neg   ? Spec Grav, UA    ? Blood, UA neg   ? pH,  UA    ? POC,PROTEIN,UA Small (1+) Negative, Trace, Small (1+), Moderate (2+), Large (3+), 4+  ? Urobilinogen, UA    ? Nitrite, UA neg   ? Leukocytes, UA Negative Negative  ? Appearance    ? Odor    ?  ?Assessment & Plan:  ?High-risk pregnancy: G2P1001 at [redacted]w[redacted]d with an Estimated Date of Delivery: 11/16/21  ? ? ?  ICD-10-CM   ?1. High-risk pregnancy in third trimester  O09.93 POC Urinalysis Dipstick OB  ?  ?2. Chronic hypertension affecting pregnancy  O10.919   ? on Norvasc 2.5 mg with good control  ?  ?3. Muscle contraction headache  G44.209   ? above right eye, responded to trigger point and nerve bundle/accupressure massage  ?  ?  ? ? ?Meds: No orders of the defined types were placed in this encounter. ? ? ?Orders:  ?Orders Placed This Encounter  ?Procedures  ? POC Urinalysis Dipstick OB  ?  ? ?Labs/procedures today: none ? ?Treatment Plan:  twice weekly surveillance, IOL 39 weeks ? ?Reviewed: Preterm labor symptoms and general obstetric precautions  including but not limited to vaginal bleeding, contractions, leaking of fluid and fetal movement were reviewed in detail with the patient.  All questions were answered. Does have home bp cuff. Office bp cuff given: not applicable. Check bp daily, let us know if consistently >150 and/or >95. ? ?Follow-up: Return for keep scheduled. ? ? ?Future Appointments  ?Date Time Provider [place]  ?10/08/2021  9:30 AM [facility] NURSE [facility]  ?10/12/2021  3:30 PM [facility] NURSE [facility]  ?10/12/2021  3:50 PM Roma Schanz, CNM [facility]  ?10/15/2021  9:30 AM [facility] NURSE [facility]  ?10/19/2021  3:00 PM [facility] - [facility] [place] [facility] None  ?10/19/2021  3:50 PM Roma Schanz, CNM [facility]  ?10/22/2021 11:30 AM [facility] NURSE [facility]  ?10/26/2021  2:45 PM [place] - FTOBGYN [place] [facility] None  ?10/26/2021  4:10 PM Florian Buff, MD [facility]  ?10/29/2021 11:50 AM [facility] NURSE [facility]  ?11/02/2021  3:15 PM [facility] [place] [facility] None  ?11/02/2021  4:10 PM Florian Buff, MD [facility]  ?11/05/2021  9:50 AM [facility] NURSE [facility]  ?11/09/2021  2:45 PM [facility] - [facility] [place] [facility] None  ?11/09/2021  3:30 PM Roma Schanz, CNM [facility]  ?11/12/2021  9:30 AM [facility] NURSE [facility]  ?11/16/2021  3:30 PM [facility] NURSE [facility]  ?11/16/2021  3:50 PM Roma Schanz, CNM [facility]  ? ? ?Orders Placed This Encounter  ?Procedures  ? POC Urinalysis Dipstick OB  ? ?[place]  ?Attending Physician for the [facility] [place] ?[facility] ?10/05/2021 ?4:38 PM ? ?

## 2021-10-06 ENCOUNTER — Encounter: Payer: Self-pay | Admitting: Obstetrics & Gynecology

## 2021-10-08 ENCOUNTER — Encounter: Payer: Self-pay | Admitting: Obstetrics & Gynecology

## 2021-10-08 ENCOUNTER — Other Ambulatory Visit: Payer: Medicaid Other

## 2021-10-08 ENCOUNTER — Ambulatory Visit (INDEPENDENT_AMBULATORY_CARE_PROVIDER_SITE_OTHER): Payer: Medicaid Other | Admitting: Obstetrics & Gynecology

## 2021-10-08 VITALS — BP 115/68 | HR 83 | Wt 148.0 lb

## 2021-10-08 DIAGNOSIS — O0993 Supervision of high risk pregnancy, unspecified, third trimester: Secondary | ICD-10-CM

## 2021-10-12 ENCOUNTER — Ambulatory Visit (INDEPENDENT_AMBULATORY_CARE_PROVIDER_SITE_OTHER): Payer: Medicaid Other | Admitting: Women's Health

## 2021-10-12 ENCOUNTER — Encounter: Payer: Self-pay | Admitting: Women's Health

## 2021-10-12 ENCOUNTER — Other Ambulatory Visit: Payer: Medicaid Other

## 2021-10-12 VITALS — BP 106/68 | HR 82 | Wt 151.0 lb

## 2021-10-12 DIAGNOSIS — O0993 Supervision of high risk pregnancy, unspecified, third trimester: Secondary | ICD-10-CM | POA: Diagnosis not present

## 2021-10-12 DIAGNOSIS — Z3A35 35 weeks gestation of pregnancy: Secondary | ICD-10-CM

## 2021-10-12 DIAGNOSIS — O10919 Unspecified pre-existing hypertension complicating pregnancy, unspecified trimester: Secondary | ICD-10-CM

## 2021-10-12 LAB — POCT URINALYSIS DIPSTICK OB
Blood, UA: NEGATIVE
Glucose, UA: NEGATIVE
Ketones, UA: NEGATIVE
Leukocytes, UA: NEGATIVE
Nitrite, UA: NEGATIVE
POC,PROTEIN,UA: NEGATIVE

## 2021-10-12 MED ORDER — BUTALBITAL-APAP-CAFFEINE 50-325-40 MG PO TABS: 1.0000 | ORAL_TABLET | ORAL | 0 refills | Status: DC | PRN

## 2021-10-12 NOTE — Patient Instructions (Addendum)
[name], thank you for choosing our office today! We appreciate the opportunity to meet your healthcare needs. You may receive a short survey by mail, e-mail, or through [employer]. If you are happy with your care we would appreciate if you could take just a few minutes to complete the survey questions. We read all of your comments and take your feedback very seriously. Thank you again for choosing our office.  ?[facility] ? ?[facility] at [facility] ?([address] , [place] [id number]) ?Entrance C, located off of [address] ?Free 24/7 valet parking  ? ?For Headaches:  ?Stay well hydrated, drink enough water so that your urine is clear, sometimes if you are dehydrated you can get headaches ?Eat small frequent meals and snacks, sometimes if you are hungry you can get headaches ?Sometimes you get headaches during pregnancy from the pregnancy hormones ?You can try tylenol (1-2 regular strength 325mg  or 1-2 extra strength 500mg ) as directed on the box. The least amount of medication that works is best.  ?Cool compresses (cool wet washcloth or ice pack) to area of head that is hurting ?You can also try drinking a caffeinated drink to see if this will help ?If not helping, try below: ? ?For Prevention of Headaches/Migraines: ?CoQ10 100mg  three times daily ?Vitamin B2 400mg  daily ?Magnesium Oxide 400-600mg  daily ? ?Foods to alleviate migraines:  ?1) dark leafy greens ?2) avocado ?3) tuna ?4) salmon  ?5) beans and legumes ? ?Foods to avoid: ?1) Excessive (or irregular timing) coffee ?3) aged cheeses ?4) chocolate ?5) citrus fruits ?6) aspartame and other artifical sweeteners ?7) yeast ?8) MSG (in processed foods) ?9) processed and cured meats ?10) nuts and certain seeds ?11) chicken livers and other organ meats ?12) dairy products like buttermilk, sour cream, and yogurt ?13) dried fruits like dates, figs, and raisins ?14) garlic ?15) onions ?16) potato  chips ?17) pickled foods like olives and sauerkraut ?18) some fresh fruits like ripe banana, papaya, red plums, raspberries, kiwi, pineapple ?19) tomato-based products ? ?Recommend to keep a migraine diary: rate daily the severity of your headache (1-10) and what foods you eat that day to help determine patterns.  ? ?If You Get a Bad Headache/Migraine: ?Benadryl 25mg   ?Magnesium Oxide ?1 large Gatorade ?2 extra strength Tylenol (1,000mg  total) ?1 cup coffee or Coke ?   ? ? ?If this doesn't help please call us @ [phone]  ? ?CLASSES: Go to [email or web address] to register for classes (childbirth, breastfeeding, waterbirth, infant CPR, daddy bootcamp, etc.) ? ?Call the office [phone]) or go to [facility] if: ?You begin to have strong, frequent contractions ?Your water breaks.  Sometimes it is a big gush of fluid, sometimes it is just a trickle that keeps getting your panties wet or running down your legs ?You have vaginal bleeding.  It is normal to have a small amount of spotting if your cervix was checked.  ?You don't feel your baby moving like normal.  If you don't, get you something to eat and drink and lay down and focus on feeling your baby move.   If your baby is still not moving like normal, you should call the office or go to [facility]. ? ?Call the office [phone]) or go to [facility] for these signs of pre-eclampsia: ?Severe headache that does not go away with Tylenol ?Visual changes- seeing spots, double, blurred vision ?Pain under your right breast or upper abdomen that does not  go away with Tums or heartburn medicine ?Nausea and/or vomiting ?Severe swelling in your hands, feet, and face  ? ?Tdap Vaccine ?It is recommended that you get the Tdap vaccine during the third trimester of EACH pregnancy to help protect your baby from getting pertussis (whooping cough) ?27-36 weeks is the BEST time to do this so that you can pass the protection on to your baby. During pregnancy is better  than after pregnancy, but if you are unable to get it during pregnancy it will be offered at the hospital.  ?You can get this vaccine with [place], at the health department, your family doctor, or some local pharmacies ?Everyone who will be around your baby should also be up-to-date on their vaccines before the baby comes. Adults (who are not pregnant) only need 1 dose of Tdap during adulthood.  ? ?[place] Pediatricians/Family Doctors ?[facility]): [address], [phone]           ?[facility]: [address], [phone]                ?[facility]): [address], [phone] (call to ask if accepting patients) ?[facility] Department: [address], [place], [phone]   ? ?[place] Pediatricians/Family Doctors ?[facility]): [address], [phone] ?[facility]: [address] [place], [phone] ?[facility] [place]: [address], [phone] ? ?[employer] Family Doctors  ?[facility]): [phone] ?[facility]: [address], [phone]  ? ?[facility] Family Doctors ?[facility]: [address] [phone] ? ?[facility]  ?[facility]: [phone], [phone] ? ?Home Blood Pressure Monitoring for Patients  ? ?Your provider has recommended that you check your blood pressure (BP) at least once a week at home. If you do not have a blood pressure cuff at home, one will be provided for you. Contact your provider if you have not received your monitor within 1 week.  ? ?Helpful Tips for Accurate Home Blood Pressure Checks  ?Don't smoke, exercise, or drink caffeine 30 minutes before checking your BP ?Use the restroom before checking your BP (a full bladder can raise your pressure) ?Relax in a comfortable upright chair ?Feet on the ground ?Left arm resting  comfortably on a flat surface at the level of your heart ?Legs uncrossed ?Back supported ?Sit quietly and don't talk ?Place the cuff on your bare arm ?Adjust snuggly, so that only two fingertips can fit between your skin and the top of the cuff ?Check 2 readings separated by at least one minute ?Keep a log of your BP readings ?For a visual, please reference this diagram: http://ccnc.care/bpdiagram ? ?Provider Name: [facility]     Phone: [phone] ? ?Zone 1: ALL CLEAR  ?Continue to monitor your symptoms:  ?BP reading is less than 140 (top number) or less than 90 (bottom number)  ?No right upper stomach pain ?No headaches or seeing spots ?No feeling nauseated or throwing up ?No swelling in face and hands ? ?Zone 2: CAUTION ?Call your doctor's office for any of the following:  ?BP reading is greater than 140 (top number) or greater than 90 (bottom number)  ?Stomach pain under your ribs in the middle or right side ?Headaches or seeing spots ?Feeling nauseated or throwing up ?Swelling in face and hands ? ?Zone 3: EMERGENCY  ?Seek immediate medical care if  you have any of the following:  ?BP reading is greater than160 (top number) or greater than 110 (bottom number) ?Severe headaches not improving with Tylenol ?Serious difficulty catching your breath ?Any worsening symptoms from Zone 2  ?Preterm Labor and Birth Information ? ?The normal length of a pregnancy is 39-41 weeks. Preterm labor is when labor starts before 37 completed weeks of pregnancy. ?What are the risk factors for preterm labor? ?Preterm labor is more likely to occur in women who: ?Have certain infections during pregnancy such as a bladder infection, sexually transmitted infection, or infection inside the uterus (chorioamnionitis). ?Have a shorter-than-normal cervix. ?Have gone into preterm labor before. ?Have had surgery on their cervix. ?Are younger than age 88 or older than age 78. ?Are African American. ?Are pregnant with twins or multiple  babies (multiple gestation). ?Take street drugs or smoke while pregnant. ?Do not gain enough weight while pregnant. ?Became pregnant shortly after having been pregnant. ?What are the symptoms of preterm labor? ?Sympto

## 2021-10-12 NOTE — Progress Notes (Signed)
? ?HIGH-RISK PREGNANCY VISIT ?Patient name: Melanie Gonzales MRN [id number]  Date of birth: 1990-06-01 ?Chief Complaint:   ?High Risk Gestation, Routine Prenatal Visit, and Non-stress Test (Took tylenol headache not helping. Having more frequent dizziness) ? ?History of Present Illness:   ?Melanie Gonzales is a 31 y.o. G2P1001 female at [redacted]w[redacted]d with an Estimated Date of Delivery: 11/16/21 being seen today for ongoing management of a high-risk pregnancy complicated by chronic hypertension currently on norvasc 2.5mg  daily.   ? ?Today she reports  nearly constant headache since last week, 2 500mg  apap do not help . All home bp's 110s/70s. Denies visual changes, ruq/epigastric pain, n/v. Has been having dizziness, legs buckle, some occ sciatica, some trouble sleeping.   Contractions: Irregular.  .  Movement: Absent. denies leaking of fluid.  ? ? ?  08/12/2021  ? 10:30 AM 05/05/2021  ?  8:37 AM 04/13/2021  ?  4:15 PM 11/19/2020  ?  1:32 PM 04/13/2020  ?  2:18 PM  ?Depression screen PHQ 2/9  ?Decreased Interest 1 1 0 0 0  ?Down, Depressed, Hopeless 0 0 0 0 0  ?PHQ - 2 Score 1 1 0 0 0  ?Altered sleeping 2 0  0   ?Tired, decreased energy 2 2  3    ?Change in appetite 1 2  0   ?Feeling bad or failure about yourself  0 0  1   ?Trouble concentrating 0 0  0   ?Moving slowly or fidgety/restless 0 0  0   ?Suicidal thoughts 0 0  0   ?PHQ-9 Score 6 5  4    ? ?  ? ?  08/12/2021  ? 10:31 AM 05/05/2021  ?  8:37 AM 11/19/2020  ?  1:32 PM  ?GAD 7 : Generalized Anxiety Score  ?Nervous, Anxious, on Edge 0 1 1  ?Control/stop worrying 0 1 0  ?Worry too much - different things 1 1 1   ?Trouble relaxing 1 0 0  ?Restless 0 0 0  ?Easily annoyed or irritable 1 1 0  ?Afraid - awful might happen 0 0 0  ?Total GAD 7 Score 3 4 2   ? ? ? ?Review of Systems:   ?Pertinent items are noted in HPI ?Denies abnormal vaginal discharge w/ itching/odor/irritation, headaches, visual changes, shortness of breath, chest pain, abdominal pain, severe nausea/vomiting, or  problems with urination or bowel movements unless otherwise stated above. ?Pertinent History Reviewed:  ?Reviewed past medical,surgical, social, obstetrical and family history.  ?Reviewed problem list, medications and allergies. ?Physical Assessment:  ? ?Vitals:  ? 10/12/21 1529  ?BP: 106/68  ?Pulse: 82  ?Weight: 151 lb (68.5 kg)  ?Body mass index is 24.37 kg/m?. ?     ?     Physical Examination:  ? General appearance: alert, well appearing, and in no distress ? Mental status: alert, oriented to person, place, and time ? Skin: warm & dry  ? Extremities: Edema: None  ?  Cardiovascular: normal heart rate noted ? Respiratory: normal respiratory effort, no distress ? Abdomen: gravid, soft, non-tender ? Pelvic: Cervical exam deferred        ? ?Fetal Status: Fetal Heart Rate (bpm): 140   Movement: Absent   ? ?Fetal Surveillance Testing today: NST: FHR baseline 140 bpm, Variability: moderate, Accelerations:present, Decelerations:  Absent= Cat 1/reactive ?Toco: occasional   ? ?Chaperone: N/A   ? ?Results for orders placed or performed in visit on 10/12/21 (from the past 24 hour(s))  ?POC Urinalysis Dipstick OB  ? Collection Time: 10/12/21  4:26 PM  ?Result Value Ref Range  ? Color, UA    ? Clarity, UA    ? Glucose, UA Negative Negative  ? Bilirubin, UA    ? Ketones, UA neg   ? Spec Grav, UA    ? Blood, UA neg   ? pH, UA    ? POC,PROTEIN,UA Negative Negative, Trace, Small (1+), Moderate (2+), Large (3+), 4+  ? Urobilinogen, UA    ? Nitrite, UA neg   ? Leukocytes, UA Negative Negative  ? Appearance    ? Odor    ?  ?Assessment & Plan:  ?High-risk pregnancy: G2P1001 at [redacted]w[redacted]d with an Estimated Date of Delivery: 11/16/21  ? ?1) CHTN, stable on norvasc 2.5mg , ASA ? ?2) H/O pp pre-e, bad headache now not r/b apap, bp great, no proteinuria, otherwise asymptomatic, does not appear to be pre-e. Will check labs, rx fioricet and gave headache tips- if not improved let us know/go to [facility] ? ?3) Dizziness> check labs ? ?4) Occ sciatica> gave  printed exercises ? ?Meds:  ?Meds ordered this encounter  ?Medications  ? butalbital-acetaminophen-caffeine (FIORICET) 50-325-40 MG tablet  ?  Sig: Take 1 tablet by mouth every 4 (four) hours as needed for headache or migraine.  ?  Dispense:  20 tablet  ?  Refill:  0  ?  Order Specific Question:   Supervising Provider  ?  Answer:   Tania Ade H [[id number]]  ? ? ?Labs/procedures today: NST ? ?Treatment Plan:      Growth u/s q 4wks    2x/wk testing nst/sono      Deliver 38-39wks (37wks or prn if poor control)____  ? ?Reviewed: Preterm labor symptoms and general obstetric precautions including but not limited to vaginal bleeding, contractions, leaking of fluid and fetal movement were reviewed in detail with the patient.  All questions were answered. Does have home bp cuff. Office bp cuff given: not applicable. Check bp daily, let us know if consistently >140 and/or >90. ? ?Follow-up: Return for As scheduled. ? ? ?Future Appointments  ?Date Time Provider [place]  ?10/15/2021  9:30 AM [facility] NURSE [facility]  ?10/19/2021  3:00 PM [place] - [facility]YN [place] [facility] None  ?10/19/2021  3:50 PM Roma Schanz, CNM [facility]  ?10/22/2021 11:30 AM [facility] NURSE [facility]  ?10/26/2021  2:45 PM [place] - [facility]YN [place] [facility] None  ?10/26/2021  4:10 PM Florian Buff, MD [facility]  ?10/29/2021 11:50 AM [facility] NURSE [facility]  ?11/02/2021  3:15 PM [facility] [place] [facility] None  ?11/02/2021  4:10 PM Florian Buff, MD [facility]  ?11/05/2021  9:50 AM [facility] NURSE [facility]  ?11/09/2021  2:15 PM [facility] [place] [facility] None  ?11/09/2021  3:30 PM Roma Schanz, CNM [facility]  ?11/12/2021  9:30 AM [facility] NURSE [facility]  ?11/16/2021  3:30 PM [facility] NURSE [facility]  ?11/16/2021  3:50 PM Roma Schanz, CNM [facility]  ? ? ?Orders Placed This Encounter  ?Procedures  ? CBC  ? Comprehensive metabolic panel  ? Protein / creatinine ratio, urine  ? POC Urinalysis  Dipstick OB  ? ?[place], [place] ?10/12/2021 ?4:39 PM  ?

## 2021-10-13 LAB — COMPREHENSIVE METABOLIC PANEL
ALT: 17 IU/L (ref 0–32)
AST: 21 IU/L (ref 0–40)
Albumin/Globulin Ratio: 1.3 (ref 1.2–2.2)
Albumin: 3.7 g/dL — ABNORMAL LOW (ref 3.9–5.0)
Alkaline Phosphatase: 152 IU/L — ABNORMAL HIGH (ref 44–121)
BUN/Creatinine Ratio: 15 (ref 9–23)
BUN: 8 mg/dL (ref 6–20)
Bilirubin Total: 0.2 mg/dL (ref 0.0–1.2)
CO2: 22 mmol/L (ref 20–29)
Calcium: 9.3 mg/dL (ref 8.7–10.2)
Chloride: 101 mmol/L (ref 96–106)
Creatinine, Ser: 0.52 mg/dL — ABNORMAL LOW (ref 0.57–1.00)
Globulin, Total: 2.9 g/dL (ref 1.5–4.5)
Glucose: 76 mg/dL (ref 70–99)
Potassium: 4.3 mmol/L (ref 3.5–5.2)
Sodium: 136 mmol/L (ref 134–144)
Total Protein: 6.6 g/dL (ref 6.0–8.5)
eGFR: 129 mL/min/{1.73_m2} (ref >59)

## 2021-10-13 LAB — CBC
Hematocrit: 31.7 % — ABNORMAL LOW (ref 34.0–46.6)
Hemoglobin: 11.2 g/dL (ref 11.1–15.9)
MCH: 33.2 pg — ABNORMAL HIGH (ref 26.6–33.0)
MCHC: 35.3 g/dL (ref 31.5–35.7)
MCV: 94 fL (ref 79–97)
Platelets: 298 10*3/uL (ref 150–450)
RBC: 3.37 x10E6/uL — ABNORMAL LOW (ref 3.77–5.28)
RDW: 12.8 % (ref 11.7–15.4)
WBC: 8 10*3/uL (ref 3.4–10.8)

## 2021-10-13 LAB — PROTEIN / CREATININE RATIO, URINE
Creatinine, Urine: 28.1 mg/dL
Protein, Ur: 6 mg/dL
Protein/Creat Ratio: 214 mg/g creat — ABNORMAL HIGH (ref 0–200)

## 2021-10-15 ENCOUNTER — Encounter: Payer: Self-pay | Admitting: Obstetrics & Gynecology

## 2021-10-15 ENCOUNTER — Ambulatory Visit (INDEPENDENT_AMBULATORY_CARE_PROVIDER_SITE_OTHER): Payer: Medicaid Other | Admitting: *Deleted

## 2021-10-15 VITALS — BP 105/70 | HR 94 | Wt 149.0 lb

## 2021-10-15 DIAGNOSIS — Z3A35 35 weeks gestation of pregnancy: Secondary | ICD-10-CM

## 2021-10-15 DIAGNOSIS — O10919 Unspecified pre-existing hypertension complicating pregnancy, unspecified trimester: Secondary | ICD-10-CM

## 2021-10-15 DIAGNOSIS — O0993 Supervision of high risk pregnancy, unspecified, third trimester: Secondary | ICD-10-CM | POA: Diagnosis not present

## 2021-10-15 DIAGNOSIS — Z331 Pregnant state, incidental: Secondary | ICD-10-CM

## 2021-10-15 DIAGNOSIS — Z1389 Encounter for screening for other disorder: Secondary | ICD-10-CM | POA: Diagnosis not present

## 2021-10-15 DIAGNOSIS — O288 Other abnormal findings on antenatal screening of mother: Secondary | ICD-10-CM

## 2021-10-15 LAB — POCT URINALYSIS DIPSTICK OB
Blood, UA: NEGATIVE
Glucose, UA: NEGATIVE
Ketones, UA: NEGATIVE
Leukocytes, UA: NEGATIVE
Nitrite, UA: NEGATIVE

## 2021-10-15 NOTE — Progress Notes (Signed)
   NURSE VISIT- NST  SUBJECTIVE:  Melanie Gonzales is a 31 y.o. G2P1001 female at [redacted]w[redacted]d, here for a NST for pregnancy complicated by [facility].  She reports active fetal movement, contractions: none, vaginal bleeding: none, membranes: intact.   OBJECTIVE:  BP 105/70   Pulse 94   Wt 149 lb (67.6 kg)   LMP  (LMP Unknown)   BMI 24.05 kg/m   Appears well, no apparent distress  Results for orders placed or performed in visit on 10/15/21 (from the past 24 hour(s))  POC Urinalysis Dipstick OB   Collection Time: 10/15/21  9:47 AM  Result Value Ref Range   Color, UA     Clarity, UA     Glucose, UA Negative Negative   Bilirubin, UA     Ketones, UA neg    Spec Grav, UA     Blood, UA neg    pH, UA     POC,PROTEIN,UA Trace Negative, Trace, Small (1+), Moderate (2+), Large (3+), 4+   Urobilinogen, UA     Nitrite, UA neg    Leukocytes, UA Negative Negative   Appearance     Odor      NST: FHR baseline 145 bpm, Variability: moderate, Accelerations:present, Decelerations:  Absent= Cat 1/reactive Toco: none   ASSESSMENT: G2P1001 at [redacted]w[redacted]d with CHTN NST reactive  PLAN: EFM strip reviewed by Dr. Nelda Marseille   Recommendations: keep next appointment as scheduled    Alice Rieger  10/15/2021 11:29 AM

## 2021-10-18 ENCOUNTER — Encounter: Payer: Self-pay | Admitting: Family Medicine

## 2021-10-18 ENCOUNTER — Other Ambulatory Visit: Payer: Self-pay | Admitting: Women's Health

## 2021-10-18 DIAGNOSIS — O10919 Unspecified pre-existing hypertension complicating pregnancy, unspecified trimester: Secondary | ICD-10-CM

## 2021-10-19 ENCOUNTER — Encounter: Payer: Self-pay | Admitting: Women's Health

## 2021-10-19 ENCOUNTER — Ambulatory Visit (INDEPENDENT_AMBULATORY_CARE_PROVIDER_SITE_OTHER): Payer: Medicaid Other | Admitting: Women's Health

## 2021-10-19 ENCOUNTER — Other Ambulatory Visit (HOSPITAL_COMMUNITY)
Admission: RE | Admit: 2021-10-19 | Discharge: 2021-10-19 | Disposition: A | Discharge status: Home / Self Care | Payer: Medicaid Other | Source: Ambulatory Visit | Attending: Women's Health | Admitting: Women's Health

## 2021-10-19 ENCOUNTER — Ambulatory Visit (INDEPENDENT_AMBULATORY_CARE_PROVIDER_SITE_OTHER): Payer: Medicaid Other

## 2021-10-19 VITALS — BP 121/73 | HR 80 | Wt 152.0 lb

## 2021-10-19 DIAGNOSIS — O10919 Unspecified pre-existing hypertension complicating pregnancy, unspecified trimester: Secondary | ICD-10-CM | POA: Diagnosis not present

## 2021-10-19 DIAGNOSIS — Z3A36 36 weeks gestation of pregnancy: Secondary | ICD-10-CM | POA: Diagnosis not present

## 2021-10-19 DIAGNOSIS — O0993 Supervision of high risk pregnancy, unspecified, third trimester: Secondary | ICD-10-CM

## 2021-10-19 LAB — POCT URINALYSIS DIPSTICK OB
Blood, UA: NEGATIVE
Glucose, UA: NEGATIVE
Ketones, UA: NEGATIVE
Leukocytes, UA: NEGATIVE
Nitrite, UA: NEGATIVE
POC,PROTEIN,UA: NEGATIVE

## 2021-10-19 NOTE — Progress Notes (Signed)
[place] PREGNANCY VISIT Patient name: Melanie Gonzales MRN [id number]  Date of birth: 1990/11/09 Chief Complaint:   Routine Prenatal Visit (Cultures and ultrasound)  History of Present Illness:   Melanie Gonzales is a 31 y.o. G2P1001 female at [redacted]w[redacted]d with an Estimated Date of Delivery: 11/16/21 being seen today for ongoing management of a high-risk pregnancy complicated by chronic hypertension currently on 2.5mg  daily, h/o pp pre-e.    Today she reports  some irregular contractions. Headaches better. Only had to take fioricet once. 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 Contractions: Irregular. Vag. Bleeding: None.  Movement: Present. denies leaking of fluid.      08/12/2021   10:30 AM 05/05/2021    8:37 AM 04/13/2021    4:15 PM 11/19/2020    1:32 PM 04/13/2020    2:18 PM  Depression screen PHQ 2/9  Decreased Interest 1 1 0 0 0  Down, Depressed, Hopeless 0 0 0 0 0  PHQ - 2 Score 1 1 0 0 0  Altered sleeping 2 0  0   Tired, decreased energy 2 2  3    Change in appetite 1 2  0   Feeling bad or failure about yourself  0 0  1   Trouble concentrating 0 0  0   Moving slowly or fidgety/restless 0 0  0   Suicidal thoughts 0 0  0   PHQ-9 Score 6 5  4          08/12/2021   10:31 AM 05/05/2021    8:37 AM 11/19/2020    1:32 PM  GAD 7 : Generalized Anxiety Score  Nervous, Anxious, on Edge 0 1 1  Control/stop worrying 0 1 0  Worry too much - different things 1 1 1   Trouble relaxing 1 0 0  Restless 0 0 0  Easily annoyed or irritable 1 1 0  Afraid - awful might happen 0 0 0  Total GAD 7 Score 3 4 2      Review of Systems:   Pertinent items are noted in HPI Denies abnormal vaginal discharge w/ itching/odor/irritation, headaches, visual changes, shortness of breath, chest pain, abdominal pain, severe nausea/vomiting, or problems with urination or bowel movements unless otherwise stated  above. Pertinent History Reviewed:  Reviewed past medical,surgical, social, obstetrical and family history.  Reviewed problem list, medications and allergies. Physical Assessment:   Vitals:   10/19/21 1536  BP: 121/73  Pulse: 80  Weight: 152 lb (68.9 kg)  Body mass index is 24.53 kg/m.           Physical Examination:   General appearance: alert, well appearing, and in no distress  Mental status: alert, oriented to person, place, and time  Skin: warm & dry   Extremities: Edema: None    Cardiovascular: normal heart rate noted  Respiratory: normal respiratory effort, no distress  Abdomen: gravid, soft, non-tender  Pelvic: Cervical exam performed  Dilation: 3 Effacement (%): Thick Station: -2  Fetal Status: Fetal Heart Rate (bpm): 138 u/s   Movement: Present Presentation: Vertex  Fetal Surveillance Testing today: [place] 36 wks,cephalic,anterior placenta gr 1,AFI 14 cm,BPP 8/8,EFW 2910 g 60%,RI .57,.59,.63=64%  Chaperone: Peggy Dones    Results for orders placed or performed in visit on 10/19/21 (from the past 24 hour(s))  POC Urinalysis Dipstick OB   Collection Time: 10/19/21  3:14 PM  Result Value Ref Range   Color, UA  Clarity, UA     Glucose, UA Negative Negative   Bilirubin, UA     Ketones, UA neg    Spec Grav, UA     Blood, UA neg    pH, UA     POC,PROTEIN,UA Negative Negative, Trace, Small (1+), Moderate (2+), Large (3+), 4+   Urobilinogen, UA     Nitrite, UA neg    Leukocytes, UA Negative Negative   Appearance     Odor      Assessment & Plan:  High-risk pregnancy: G2P1001 at [redacted]w[redacted]d with an Estimated Date of Delivery: 11/16/21   1) CHTN, stable on norvasc 2.5mg , ASA  2) H/O pp pre-e, reviewed pre-e s/s, reasons to seek care  Meds: No orders of the defined types were placed in this encounter.   Labs/procedures today: GBS, GC/CT, SVE, and U/S  Treatment Plan:   2x/wk testing nst/sono    Deliver 38-39wks (37wks or prn if poor control)____   Reviewed: Preterm  labor symptoms and general obstetric precautions including but not limited to vaginal bleeding, contractions, leaking of fluid and fetal movement were reviewed in detail with the patient.  All questions were answered. Does have home bp cuff. Office bp cuff given: not applicable. Check bp daily, let us know if consistently >140 and/or >90.  Follow-up: Return for As scheduled.   Future Appointments  Date Time Provider [place]  10/22/2021 11:30 AM [facility] NURSE [facility]  10/26/2021  2:45 PM [place] - [facility]YN [place] [facility] None  10/26/2021  4:10 PM Florian Buff, MD [facility]  10/29/2021 11:50 AM [facility] NURSE [facility]  11/02/2021  3:15 PM [place] - F[facility]YN [place] [facility] None  11/02/2021  4:10 PM Florian Buff, MD [facility]  11/05/2021  9:50 AM [facility] NURSE [facility]  11/09/2021  2:15 PM [place] - FTO[facility]YN [place] [facility] None  11/09/2021  3:30 PM Roma Schanz, CNM [facility]  11/12/2021  9:30 AM [facility] NURSE [facility]  11/16/2021  3:30 PM [facility] NURSE [facility]  11/16/2021  3:50 PM Roma Schanz, CNM [facility]    Orders Placed This Encounter  Procedures   Culture, beta strep (group b only)   US FETAL BPP WO NON STRESS   [place] UA Cord Doppler   POC Urinalysis Dipstick OB   Roma Schanz CNM, [facility] 10/19/2021 4:03 PM

## 2021-10-19 NOTE — Progress Notes (Signed)
[place] 36 wks,cephalic,anterior placenta gr 1,AFI 14 cm,BPP 8/8,EFW 2910 g 60%,RI .57,.59,.63=64%

## 2021-10-19 NOTE — Patient Instructions (Signed)
Benedetto Coons, thank you for choosing our office today! We appreciate the opportunity to meet your healthcare needs. You may receive a short survey by mail, e-mail, or through [employer]. If you are happy with your care we would appreciate if you could take just a few minutes to complete the survey questions. We read all of your comments and take your feedback very seriously. Thank you again for choosing our office.  Center for [employer] Team at [place] at [facility] ([place], [place] [address]) Entrance C, located off of [place] parking   CLASSES: Go to [email or web address] to register for classes (childbirth, breastfeeding, waterbirth, infant CPR, daddy bootcamp, etc.)  Call the office [phone]) or go to [facility] if: You begin to have strong, frequent contractions Your water breaks.  Sometimes it is a big gush of fluid, sometimes it is just a trickle that keeps getting your panties wet or running down your legs You have vaginal bleeding.  It is normal to have a small amount of spotting if your cervix was checked.  You don't feel your baby moving like normal.  If you don't, get you something to eat and drink and lay down and focus on feeling your baby move.   If your baby is still not moving like normal, you should call the office or go to [facility].  Call the office [phone]) or go to [facility] hospital for these signs of pre-eclampsia: Severe headache that does not go away with Tylenol Visual changes- seeing spots, double, blurred vision Pain under your right breast or upper abdomen that does not go away with Tums or heartburn medicine Nausea and/or vomiting Severe swelling in your hands, feet, and face   Tdap Vaccine It is recommended that you get the Tdap vaccine during the third trimester of EACH pregnancy to help protect your baby from getting pertussis (whooping cough) 27-36 weeks is the BEST time to do  this so that you can pass the protection on to your baby. During pregnancy is better than after pregnancy, but if you are unable to get it during pregnancy it will be offered at the hospital.  You can get this vaccine with [place], at the health department, your family doctor, or some local pharmacies Everyone who will be around your baby should also be up-to-date on their vaccines before the baby comes. Adults (who are not pregnant) only need 1 dose of Tdap during adulthood.   [facility] Pediatricians/Family Doctors [facility]): [address] [place], [place] [facility]: [address] [place], [phone]                [facility]): [place], [phone] (call to ask if accepting patients) [facility] Department: [address], [place], [place] Pediatricians/Family Doctors [facility]): [place] [place], [address], [place] [facility]: [address] [place], [place] [facility]: [place], [place] [facility]): [phone] [facility]: [address], [place]: [address] [facility] [facility]: [phone], [phone]  Home Blood Pressure Monitoring for Patients   Your provider has recommended that you check your  blood pressure (BP) at least once a week at home. If you do not have a blood pressure cuff at home, one will be provided for you. Contact your provider if you have not received your monitor within 1 week.   Helpful Tips for Accurate Home Blood Pressure Checks  Don't smoke, exercise, or drink caffeine 30 minutes before checking your BP Use the restroom before checking your BP (a full bladder can raise your  pressure) Relax in a comfortable upright chair Feet on the ground Left arm resting comfortably on a flat surface at the level of your heart Legs uncrossed Back supported Sit quietly and don't talk Place the cuff on your bare arm Adjust snuggly, so that only two fingertips can fit between your skin and the top of the cuff Check 2 readings separated by at least one minute Keep a log of your BP readings For a visual, please reference this diagram: http://ccnc.care/bpdiagram  Provider Name: [facility]     Phone: [phone]  Zone 1: ALL CLEAR  Continue to monitor your symptoms:  BP reading is less than 140 (top number) or less than 90 (bottom number)  No right upper stomach pain No headaches or seeing spots No feeling nauseated or throwing up No swelling in face and hands  Zone 2: CAUTION Call your doctor's office for any of the following:  BP reading is greater than 140 (top number) or greater than 90 (bottom number)  Stomach pain under your ribs in the middle or right side Headaches or seeing spots Feeling nauseated or throwing up Swelling in face and hands  Zone 3: EMERGENCY  Seek immediate medical care if you have any of the following:  BP reading is greater than160 (top number) or greater than 110 (bottom number) Severe headaches not improving with Tylenol Serious difficulty catching your breath Any worsening symptoms from Zone 2  Preterm Labor and Birth Information  The normal length of a pregnancy is 39-41 weeks. Preterm labor is when labor starts before 37 completed weeks of pregnancy. What are the risk factors for preterm labor? Preterm labor is more likely to occur in women who: Have certain infections during pregnancy such as a bladder infection, sexually transmitted infection, or infection inside the uterus (chorioamnionitis). Have a shorter-than-normal cervix. Have gone into preterm labor before. Have had surgery on their cervix. Are younger than age 17  or older than age 35. Are African American. Are pregnant with twins or multiple babies (multiple gestation). Take street drugs or smoke while pregnant. Do not gain enough weight while pregnant. Became pregnant shortly after having been pregnant. What are the symptoms of preterm labor? Symptoms of preterm labor include: Cramps similar to those that can happen during a menstrual period. The cramps may happen with diarrhea. Pain in the abdomen or lower back. Regular uterine contractions that may feel like tightening of the abdomen. A feeling of increased pressure in the pelvis. Increased watery or bloody mucus discharge from the vagina. Water breaking (ruptured amniotic sac). Why is it important to recognize signs of preterm labor? It is important to recognize signs of preterm labor because babies who are born prematurely may not be fully developed. This can put them at an increased risk for: Long-term (chronic) heart and lung problems. Difficulty immediately after birth with regulating body systems, including blood sugar, body temperature, heart rate, and breathing rate. Bleeding in the brain. Cerebral palsy. Learning difficulties. Death. These risks are highest for babies who are born before 34 weeks   of pregnancy. How is preterm labor treated? Treatment depends on the length of your pregnancy, your condition, and the health of your baby. It may involve: Having a stitch (suture) placed in your cervix to prevent your cervix from opening too early (cerclage). Taking or being given medicines, such as: Hormone medicines. These may be given early in pregnancy to help support the pregnancy. Medicine to stop contractions. Medicines to help mature the baby's lungs. These may be prescribed if the risk of delivery is high. Medicines to prevent your baby from developing cerebral palsy. If the labor happens before 34 weeks of pregnancy, you may need to stay in the hospital. What should I do if I  think I am in preterm labor? If you think that you are going into preterm labor, call your health care provider right away. How can I prevent preterm labor in future pregnancies? To increase your chance of having a full-term pregnancy: Do not use any tobacco products, such as cigarettes, chewing tobacco, and e-cigarettes. If you need help quitting, ask your health care provider. Do not use street drugs or medicines that have not been prescribed to you during your pregnancy. Talk with your health care provider before taking any herbal supplements, even if you have been taking them regularly. Make sure you gain a healthy amount of weight during your pregnancy. Watch for infection. If you think that you might have an infection, get it checked right away. Make sure to tell your health care provider if you have gone into preterm labor before. This information is not intended to replace advice given to you by your health care provider. Make sure you discuss any questions you have with your health care provider. Document Revised: 09/07/2018 Document Reviewed: 10/07/2015 Elsevier Patient Education  2020 Elsevier Inc.   

## 2021-10-21 ENCOUNTER — Other Ambulatory Visit: Payer: Self-pay | Admitting: Family Medicine

## 2021-10-21 LAB — CERVICOVAGINAL ANCILLARY ONLY
Chlamydia: NEGATIVE
Comment: NEGATIVE
Comment: NORMAL
Neisseria Gonorrhea: NEGATIVE

## 2021-10-21 MED ORDER — ERYTHROMYCIN 5 MG/GM OP OINT: 1.0000 "application " | TOPICAL_OINTMENT | Freq: Four times a day (QID) | OPHTHALMIC | 0 refills | Status: DC

## 2021-10-22 ENCOUNTER — Encounter: Payer: Self-pay | Admitting: Obstetrics & Gynecology

## 2021-10-22 ENCOUNTER — Ambulatory Visit (INDEPENDENT_AMBULATORY_CARE_PROVIDER_SITE_OTHER): Payer: Medicaid Other | Admitting: *Deleted

## 2021-10-22 VITALS — BP 113/74 | HR 105 | Wt 151.0 lb

## 2021-10-22 DIAGNOSIS — I10 Essential (primary) hypertension: Secondary | ICD-10-CM

## 2021-10-22 DIAGNOSIS — O0993 Supervision of high risk pregnancy, unspecified, third trimester: Secondary | ICD-10-CM

## 2021-10-22 DIAGNOSIS — Z3A36 36 weeks gestation of pregnancy: Secondary | ICD-10-CM

## 2021-10-22 LAB — POCT URINALYSIS DIPSTICK OB
Blood, UA: NEGATIVE
Glucose, UA: NEGATIVE
Ketones, UA: NEGATIVE
Leukocytes, UA: NEGATIVE
Nitrite, UA: NEGATIVE

## 2021-10-22 NOTE — Progress Notes (Signed)
   NURSE VISIT- NST  SUBJECTIVE:  Melanie Gonzales is a 31 y.o. G2P1001 female at [redacted]w[redacted]d, here for a NST for pregnancy complicated by [facility].  She reports active fetal movement, contractions: none, vaginal bleeding: none, membranes: intact.   OBJECTIVE:  BP 113/74   Pulse (!) 105   Wt 151 lb (68.5 kg)   LMP  (LMP Unknown)   BMI 24.37 kg/m   Appears well, no apparent distress  Results for orders placed or performed in visit on 10/22/21 (from the past 24 hour(s))  POC Urinalysis Dipstick OB   Collection Time: 10/22/21 11:51 AM  Result Value Ref Range   Color, UA     Clarity, UA     Glucose, UA Negative Negative   Bilirubin, UA     Ketones, UA neg    Spec Grav, UA     Blood, UA neg    pH, UA     POC,PROTEIN,UA Trace Negative, Trace, Small (1+), Moderate (2+), Large (3+), 4+   Urobilinogen, UA     Nitrite, UA neg    Leukocytes, UA Negative Negative   Appearance     Odor      NST: FHR baseline 140 bpm, Variability: moderate, Accelerations:present, Decelerations:  Absent= Cat 1/reactive Toco: none   ASSESSMENT: G2P1001 at [redacted]w[redacted]d with CHTN NST reactive  PLAN: EFM strip reviewed by Philipp Deputy, CNM   Recommendations: keep next appointment as scheduled    Melanie Gonzales  10/22/2021 12:19 PM

## 2021-10-23 LAB — CULTURE, BETA STREP (GROUP B ONLY): Strep Gp B Culture: NEGATIVE

## 2021-10-26 ENCOUNTER — Encounter: Payer: Self-pay | Admitting: Obstetrics & Gynecology

## 2021-10-26 ENCOUNTER — Ambulatory Visit (INDEPENDENT_AMBULATORY_CARE_PROVIDER_SITE_OTHER): Payer: Medicaid Other

## 2021-10-26 ENCOUNTER — Other Ambulatory Visit: Payer: Medicaid Other

## 2021-10-26 ENCOUNTER — Ambulatory Visit (INDEPENDENT_AMBULATORY_CARE_PROVIDER_SITE_OTHER): Payer: Medicaid Other | Admitting: Obstetrics & Gynecology

## 2021-10-26 VITALS — BP 120/75 | HR 82 | Wt 153.0 lb

## 2021-10-26 DIAGNOSIS — O0993 Supervision of high risk pregnancy, unspecified, third trimester: Secondary | ICD-10-CM

## 2021-10-26 DIAGNOSIS — O10919 Unspecified pre-existing hypertension complicating pregnancy, unspecified trimester: Secondary | ICD-10-CM | POA: Diagnosis not present

## 2021-10-26 DIAGNOSIS — Z3A37 37 weeks gestation of pregnancy: Secondary | ICD-10-CM | POA: Diagnosis not present

## 2021-10-26 LAB — POCT URINALYSIS DIPSTICK OB
Blood, UA: NEGATIVE
Glucose, UA: NEGATIVE
Ketones, UA: NEGATIVE
Leukocytes, UA: NEGATIVE
Nitrite, UA: NEGATIVE
POC,PROTEIN,UA: NEGATIVE

## 2021-10-26 NOTE — Progress Notes (Signed)
[place] 37 wks,cephalic,BPP 8/8,RI .52,.63,.52,.51=49%,AFI  9.4 cm,FHR 146 bpm,anterior placenta gr 2

## 2021-10-26 NOTE — Progress Notes (Signed)
[place] PREGNANCY VISIT Patient name: Melanie Gonzales MRN [id number]  Date of birth: July 28, 1990 Chief Complaint:   Routine Prenatal Visit, High Risk Gestation, and Pregnancy Ultrasound  History of Present Illness:   Melanie Gonzales is a 31 y.o. G67P1001 female at [redacted]w[redacted]d with an Estimated Date of Delivery: 11/16/21 being seen today for ongoing management of a high-risk pregnancy complicated by chronic hypertension currently on norvasc 2.5 mg daily.    Today she reports no complaints. Contractions: Irritability. Vag. Bleeding: None.  Movement: Present. denies leaking of fluid.      08/12/2021   10:30 AM 05/05/2021    8:37 AM 04/13/2021    4:15 PM 11/19/2020    1:32 PM 04/13/2020    2:18 PM  Depression screen PHQ 2/9  Decreased Interest 1 1 0 0 0  Down, Depressed, Hopeless 0 0 0 0 0  PHQ - 2 Score 1 1 0 0 0  Altered sleeping 2 0  0   Tired, decreased energy 2 2  3    Change in appetite 1 2  0   Feeling bad or failure about yourself  0 0  1   Trouble concentrating 0 0  0   Moving slowly or fidgety/restless 0 0  0   Suicidal thoughts 0 0  0   PHQ-9 Score 6 5  4          08/12/2021   10:31 AM 05/05/2021    8:37 AM 11/19/2020    1:32 PM  GAD 7 : Generalized Anxiety Score  Nervous, Anxious, on Edge 0 1 1  Control/stop worrying 0 1 0  Worry too much - different things 1 1 1   Trouble relaxing 1 0 0  Restless 0 0 0  Easily annoyed or irritable 1 1 0  Afraid - awful might happen 0 0 0  Total GAD 7 Score 3 4 2      Review of Systems:   Pertinent items are noted in HPI Denies abnormal vaginal discharge w/ itching/odor/irritation, headaches, visual changes, shortness of breath, chest pain, abdominal pain, severe nausea/vomiting, or problems with urination or bowel movements unless otherwise stated above. Pertinent History Reviewed:  Reviewed past medical,surgical, social, obstetrical and family history.  Reviewed problem list, medications and allergies. Physical Assessment:    Vitals:   10/26/21 1530  BP: 120/75  Pulse: 82  Weight: 153 lb (69.4 kg)  Body mass index is 24.69 kg/m.           Physical Examination:   General appearance: alert, well appearing, and in no distress  Mental status: alert, oriented to person, place, and time  Skin: warm & dry   Extremities: Edema: None    Cardiovascular: normal heart rate noted  Respiratory: normal respiratory effort, no distress  Abdomen: gravid, soft, non-tender  Pelvic: Cervical exam performed  Dilation: 3.5 Effacement (%): 50 Station: -2  Fetal Status: Fetal Heart Rate (bpm): 145 Fundal Height: 37 cm Movement: Present Presentation: Vertex  Fetal Surveillance Testing today: BPP 8/8 normal Dopplers   Chaperone: Marcelino Scot    Results for orders placed or performed in visit on 10/26/21 (from the past 24 hour(s))  POC Urinalysis Dipstick OB   Collection Time: 10/26/21  3:23 PM  Result Value Ref Range   Color, UA     Clarity, UA     Glucose, UA Negative Negative   Bilirubin, UA     Ketones, UA neg    Spec Grav, UA     Blood, UA neg  pH, UA     POC,PROTEIN,UA Negative Negative, Trace, Small (1+), Moderate (2+), Large (3+), 4+   Urobilinogen, UA     Nitrite, UA neg    Leukocytes, UA Negative Negative   Appearance     Odor      Assessment & Plan:  High-risk pregnancy: G2P1001 at [redacted]w[redacted]d with an Estimated Date of Delivery: 11/16/21      ICD-10-CM   1. High-risk pregnancy in third trimester  O09.93 POC Urinalysis Dipstick OB    2. Chronic hypertension affecting pregnancy  O10.919        Meds: No orders of the defined types were placed in this encounter.   Orders:  Orders Placed This Encounter  Procedures   POC Urinalysis Dipstick OB     Labs/procedures today: U/S  Treatment Plan:  twice weekly surveillance, IOL 39 weeks    Follow-up: Return for keep scheduled.   Future Appointments  Date Time Provider [place]  10/29/2021 11:50 AM [facility] NURSE [facility]   11/02/2021  3:15 PM [place] - [facility] [place] [facility] None  11/02/2021  4:10 PM Florian Buff, MD [facility]  11/05/2021  9:50 AM [facility] NURSE [facility]  11/09/2021  2:15 PM [place] - [facility] [place] [facility] None  11/09/2021  3:30 PM Roma Schanz, CNM [facility]  11/12/2021  9:30 AM [facility] NURSE [facility]  11/16/2021  3:30 PM [facility] NURSE [facility]  11/16/2021  3:50 PM Gertie Exon, Royetta Crochet, CNM [facility]    Orders Placed This Encounter  Procedures   POC Urinalysis Dipstick OB   Florian Buff  Attending Physician for the [facility] 10/26/2021 4:20 PM

## 2021-10-29 ENCOUNTER — Encounter: Payer: Self-pay | Admitting: Obstetrics & Gynecology

## 2021-10-29 ENCOUNTER — Ambulatory Visit (INDEPENDENT_AMBULATORY_CARE_PROVIDER_SITE_OTHER): Payer: Medicaid Other | Admitting: *Deleted

## 2021-10-29 VITALS — BP 119/78 | HR 92 | Wt 150.0 lb

## 2021-10-29 DIAGNOSIS — O0993 Supervision of high risk pregnancy, unspecified, third trimester: Secondary | ICD-10-CM | POA: Diagnosis not present

## 2021-10-29 DIAGNOSIS — O10919 Unspecified pre-existing hypertension complicating pregnancy, unspecified trimester: Secondary | ICD-10-CM

## 2021-10-29 DIAGNOSIS — Z3A37 37 weeks gestation of pregnancy: Secondary | ICD-10-CM

## 2021-10-29 LAB — POCT URINALYSIS DIPSTICK OB
Blood, UA: NEGATIVE
Glucose, UA: NEGATIVE
Ketones, UA: NEGATIVE
Leukocytes, UA: NEGATIVE
Nitrite, UA: NEGATIVE
POC,PROTEIN,UA: NEGATIVE

## 2021-10-29 NOTE — Progress Notes (Signed)
   NURSE VISIT- NST  SUBJECTIVE:  Melanie Gonzales is a 31 y.o. G2P1001 female at [redacted]w[redacted]d, here for a NST for pregnancy complicated by [facility]  She reports active fetal movement, contractions: none, vaginal bleeding: none, membranes: intact.   OBJECTIVE:  BP 119/78   Pulse 92   Wt 150 lb (68 kg)   LMP  (LMP Unknown)   BMI 24.21 kg/m   Appears well, no apparent distress  Results for orders placed or performed in visit on 10/29/21 (from the past 24 hour(s))  POC Urinalysis Dipstick OB   Collection Time: 10/29/21  1:19 PM  Result Value Ref Range   Color, UA     Clarity, UA     Glucose, UA Negative Negative   Bilirubin, UA     Ketones, UA neg    Spec Grav, UA     Blood, UA neg    pH, UA     POC,PROTEIN,UA Negative Negative, Trace, Small (1+), Moderate (2+), Large (3+), 4+   Urobilinogen, UA     Nitrite, UA neg    Leukocytes, UA Negative Negative   Appearance     Odor      NST: FHR baseline 145 bpm, Variability: moderate, Accelerations:present, Decelerations:  Absent= Cat 1/reactive Toco: UI   ASSESSMENT: G2P1001 at [redacted]w[redacted]d with CHTN NST reactive  PLAN: EFM strip reviewed by Dr. Despina Hidden   Recommendations: keep next appointment as scheduled    Jobe Marker  10/29/2021 1:19 PM

## 2021-11-01 ENCOUNTER — Other Ambulatory Visit: Payer: Self-pay | Admitting: Obstetrics & Gynecology

## 2021-11-01 DIAGNOSIS — O10919 Unspecified pre-existing hypertension complicating pregnancy, unspecified trimester: Secondary | ICD-10-CM

## 2021-11-02 ENCOUNTER — Encounter: Payer: Self-pay | Admitting: Obstetrics & Gynecology

## 2021-11-02 ENCOUNTER — Ambulatory Visit (INDEPENDENT_AMBULATORY_CARE_PROVIDER_SITE_OTHER): Payer: Medicaid Other | Admitting: Obstetrics & Gynecology

## 2021-11-02 ENCOUNTER — Ambulatory Visit (INDEPENDENT_AMBULATORY_CARE_PROVIDER_SITE_OTHER): Payer: Medicaid Other

## 2021-11-02 DIAGNOSIS — Z3A38 38 weeks gestation of pregnancy: Secondary | ICD-10-CM | POA: Diagnosis not present

## 2021-11-02 DIAGNOSIS — O10919 Unspecified pre-existing hypertension complicating pregnancy, unspecified trimester: Secondary | ICD-10-CM

## 2021-11-02 DIAGNOSIS — O09893 Supervision of other high risk pregnancies, third trimester: Secondary | ICD-10-CM

## 2021-11-02 DIAGNOSIS — O0993 Supervision of high risk pregnancy, unspecified, third trimester: Secondary | ICD-10-CM

## 2021-11-02 LAB — POCT URINALYSIS DIPSTICK OB
Blood, UA: NEGATIVE
Glucose, UA: NEGATIVE
Ketones, UA: NEGATIVE
Leukocytes, UA: NEGATIVE
Nitrite, UA: NEGATIVE
POC,PROTEIN,UA: NEGATIVE

## 2021-11-02 NOTE — Progress Notes (Signed)
[place] 38 wks,cephalic,anterior placenta gr 2,BPP 8/8,RI .53,.58,.56,.54=54%,AFI 11.2 cm, FHR 146 bpm

## 2021-11-02 NOTE — Progress Notes (Signed)
[place] PREGNANCY VISIT Patient name: Melanie Gonzales MRN [id number]  Date of birth: June 08, 1990 Chief Complaint:   Routine Prenatal Visit ([place] today)  History of Present Illness:   Melanie Gonzales is a 31 y.o. G2P1001 female at [redacted]w[redacted]d with an Estimated Date of Delivery: 11/16/21 being seen today for ongoing management of a high-risk pregnancy complicated by chronic hypertension currently on Norvasc 2.5 mg.    Today she reports no complaints. Contractions: Irritability. Vag. Bleeding: None.  Movement: Present. denies leaking of fluid.      08/12/2021   10:30 AM 05/05/2021    8:37 AM 04/13/2021    4:15 PM 11/19/2020    1:32 PM 04/13/2020    2:18 PM  Depression screen PHQ 2/9  Decreased Interest 1 1 0 0 0  Down, Depressed, Hopeless 0 0 0 0 0  PHQ - 2 Score 1 1 0 0 0  Altered sleeping 2 0  0   Tired, decreased energy 2 2  3    Change in appetite 1 2  0   Feeling bad or failure about yourself  0 0  1   Trouble concentrating 0 0  0   Moving slowly or fidgety/restless 0 0  0   Suicidal thoughts 0 0  0   PHQ-9 Score 6 5  4          08/12/2021   10:31 AM 05/05/2021    8:37 AM 11/19/2020    1:32 PM  GAD 7 : Generalized Anxiety Score  Nervous, Anxious, on Edge 0 1 1  Control/stop worrying 0 1 0  Worry too much - different things 1 1 1   Trouble relaxing 1 0 0  Restless 0 0 0  Easily annoyed or irritable 1 1 0  Afraid - awful might happen 0 0 0  Total GAD 7 Score 3 4 2      Review of Systems:   Pertinent items are noted in HPI Denies abnormal vaginal discharge w/ itching/odor/irritation, headaches, visual changes, shortness of breath, chest pain, abdominal pain, severe nausea/vomiting, or problems with urination or bowel movements unless otherwise stated above. Pertinent History Reviewed:  Reviewed past medical,surgical, social, obstetrical and family history.  Reviewed problem list, medications and allergies. Physical Assessment:  There were no vitals filed for this  visit.There is no height or weight on file to calculate BMI.           Physical Examination:   General appearance: alert, well appearing, and in no distress  Mental status: alert, oriented to person, place, and time  Skin: warm & dry   Extremities: Edema: None    Cardiovascular: normal heart rate noted  Respiratory: normal respiratory effort, no distress  Abdomen: gravid, soft, non-tender  Pelvic: Cervical exam performed  Dilation: 4.5 Effacement (%): 50 Station: -2  Fetal Status: Fetal Heart Rate (bpm): 150 Fundal Height: 36 cm Movement: Present Presentation: Vertex  Fetal Surveillance Testing today: BPP 8/8 with normal Dopplers   Chaperone: Celene Squibb    Results for orders placed or performed in visit on 11/02/21 (from the past 24 hour(s))  POC Urinalysis Dipstick OB   Collection Time: 11/02/21  3:27 PM  Result Value Ref Range   Color, UA     Clarity, UA     Glucose, UA Negative Negative   Bilirubin, UA     Ketones, UA neg    Spec Grav, UA     Blood, UA neg    pH, UA     POC,PROTEIN,UA Negative Negative,  Trace, Small (1+), Moderate (2+), Large (3+), 4+   Urobilinogen, UA     Nitrite, UA neg    Leukocytes, UA Negative Negative   Appearance     Odor      Assessment & Plan:  High-risk pregnancy: G2P1001 at [redacted]w[redacted]d with an Estimated Date of Delivery: 11/16/21      ICD-10-CM   1. Supervision of other high risk pregnancies, third trimester  O09.893 POC Urinalysis Dipstick OB    2. [redacted] weeks gestation of pregnancy  Z3A.38 POC Urinalysis Dipstick OB    3. Chronic hypertension affecting pregnancy  O10.919         Meds: No orders of the defined types were placed in this encounter.   Orders:  Orders Placed This Encounter  Procedures   POC Urinalysis Dipstick OB     Labs/procedures today: U/S  Treatment Plan:  IOL 39 weeks if undelivered    Follow-up: Return for keep scheduled.   Future Appointments  Date Time Provider [place]  11/05/2021  9:50 AM  [facility] NURSE [facility]  11/09/2021  2:15 PM [place] - [facility]YN [place] [facility] None  11/09/2021  3:30 PM Roma Schanz, CNM [facility]  11/12/2021  9:30 AM [facility] NURSE [facility]  11/16/2021  3:30 PM [facility]UR[facility]  11/16/2021  3:50 PM Gertie Exon, Royetta Crochet, CNM [facility]    Orders Placed This Encounter  Procedures   POC Urinalysis Dipstick OB   Florian Buff  Attending Physician for the [facility] 11/02/2021 4:16 PM

## 2021-11-03 ENCOUNTER — Other Ambulatory Visit: Payer: Self-pay

## 2021-11-03 ENCOUNTER — Inpatient Hospital Stay (HOSPITAL_COMMUNITY): Payer: Medicaid Other | Admitting: Anesthesiology

## 2021-11-03 ENCOUNTER — Inpatient Hospital Stay (HOSPITAL_COMMUNITY)
Admission: AD | Admit: 2021-11-03 | Discharge: 2021-11-04 | DRG: 806 | Disposition: A | Discharge status: Home / Self Care | Payer: Medicaid Other | Attending: Obstetrics and Gynecology | Admitting: Obstetrics and Gynecology

## 2021-11-03 ENCOUNTER — Encounter (HOSPITAL_COMMUNITY): Payer: Self-pay | Admitting: Obstetrics and Gynecology

## 2021-11-03 DIAGNOSIS — A6000 Herpesviral infection of urogenital system, unspecified: Secondary | ICD-10-CM | POA: Diagnosis not present

## 2021-11-03 DIAGNOSIS — Z975 Presence of (intrauterine) contraceptive device: Secondary | ICD-10-CM

## 2021-11-03 DIAGNOSIS — O26893 Other specified pregnancy related conditions, third trimester: Secondary | ICD-10-CM | POA: Diagnosis not present

## 2021-11-03 DIAGNOSIS — Z30017 Encounter for initial prescription of implantable subdermal contraceptive: Secondary | ICD-10-CM | POA: Diagnosis not present

## 2021-11-03 DIAGNOSIS — Z3A38 38 weeks gestation of pregnancy: Secondary | ICD-10-CM | POA: Diagnosis not present

## 2021-11-03 DIAGNOSIS — O1002 Pre-existing essential hypertension complicating childbirth: Secondary | ICD-10-CM | POA: Diagnosis not present

## 2021-11-03 DIAGNOSIS — Z87891 Personal history of nicotine dependence: Secondary | ICD-10-CM

## 2021-11-03 DIAGNOSIS — O1490 Unspecified pre-eclampsia, unspecified trimester: Secondary | ICD-10-CM | POA: Diagnosis present

## 2021-11-03 DIAGNOSIS — Z880 Allergy status to penicillin: Secondary | ICD-10-CM | POA: Diagnosis not present

## 2021-11-03 DIAGNOSIS — O9852 Other viral diseases complicating childbirth: Secondary | ICD-10-CM | POA: Diagnosis not present

## 2021-11-03 DIAGNOSIS — O164 Unspecified maternal hypertension, complicating childbirth: Secondary | ICD-10-CM | POA: Diagnosis not present

## 2021-11-03 DIAGNOSIS — O1092 Unspecified pre-existing hypertension complicating childbirth: Secondary | ICD-10-CM | POA: Diagnosis not present

## 2021-11-03 DIAGNOSIS — D649 Anemia, unspecified: Secondary | ICD-10-CM | POA: Diagnosis not present

## 2021-11-03 DIAGNOSIS — R52 Pain, unspecified: Secondary | ICD-10-CM | POA: Diagnosis not present

## 2021-11-03 DIAGNOSIS — R768 Other specified abnormal immunological findings in serum: Secondary | ICD-10-CM | POA: Diagnosis present

## 2021-11-03 DIAGNOSIS — O9832 Other infections with a predominantly sexual mode of transmission complicating childbirth: Secondary | ICD-10-CM | POA: Diagnosis not present

## 2021-11-03 DIAGNOSIS — O0990 Supervision of high risk pregnancy, unspecified, unspecified trimester: Secondary | ICD-10-CM

## 2021-11-03 DIAGNOSIS — O10919 Unspecified pre-existing hypertension complicating pregnancy, unspecified trimester: Secondary | ICD-10-CM | POA: Diagnosis present

## 2021-11-03 DIAGNOSIS — O9902 Anemia complicating childbirth: Secondary | ICD-10-CM | POA: Diagnosis not present

## 2021-11-03 DIAGNOSIS — O479 False labor, unspecified: Secondary | ICD-10-CM | POA: Diagnosis not present

## 2021-11-03 LAB — CBC
HCT: 34.2 % — ABNORMAL LOW (ref 36.0–46.0)
Hemoglobin: 11.8 g/dL — ABNORMAL LOW (ref 12.0–15.0)
MCH: 33.1 pg (ref 26.0–34.0)
MCHC: 34.5 g/dL (ref 30.0–36.0)
MCV: 95.8 fL (ref 80.0–100.0)
Platelets: 270 10*3/uL (ref 150–400)
RBC: 3.57 MIL/uL — ABNORMAL LOW (ref 3.87–5.11)
RDW: 13.5 % (ref 11.5–15.5)
WBC: 11.3 10*3/uL — ABNORMAL HIGH (ref 4.0–10.5)
nRBC: 0 % (ref 0.0–0.2)

## 2021-11-03 LAB — TYPE AND SCREEN
ABO/RH(D): AB POS
Antibody Screen: NEGATIVE

## 2021-11-03 LAB — RPR: RPR Ser Ql: NONREACTIVE

## 2021-11-03 MED ORDER — ACETAMINOPHEN 325 MG PO TABS: 650.0000 mg | ORAL_TABLET | ORAL | Status: DC | PRN

## 2021-11-03 MED ORDER — LACTATED RINGERS IV SOLN: INTRAVENOUS | Status: DC

## 2021-11-03 MED ORDER — OXYTOCIN-SODIUM CHLORIDE 30-0.9 UT/500ML-% IV SOLN
2.5000 [IU]/h | INTRAVENOUS | Status: DC
  Administered 2021-11-03: 2.5 [IU]/h via INTRAVENOUS

## 2021-11-03 MED ORDER — LIDOCAINE HCL (PF) 1 % IJ SOLN
INTRAMUSCULAR | Status: DC | PRN
  Administered 2021-11-03 (×2): 5 mL via EPIDURAL

## 2021-11-03 MED ORDER — ONDANSETRON HCL 4 MG/2ML IJ SOLN
4.0000 mg | Freq: Four times a day (QID) | INTRAMUSCULAR | Status: DC | PRN
  Administered 2021-11-03: 4 mg via INTRAVENOUS
  Filled 2021-11-03: qty 2

## 2021-11-03 MED ORDER — OXYCODONE-ACETAMINOPHEN 5-325 MG PO TABS: 1.0000 | ORAL_TABLET | ORAL | Status: DC | PRN

## 2021-11-03 MED ORDER — WITCH HAZEL-GLYCERIN EX PADS: 1.0000 "application " | MEDICATED_PAD | CUTANEOUS | Status: DC | PRN

## 2021-11-03 MED ORDER — EPHEDRINE 5 MG/ML INJ: 10.0000 mg | INTRAVENOUS | Status: DC | PRN

## 2021-11-03 MED ORDER — LACTATED RINGERS AMNIOINFUSION: INTRAVENOUS | Status: DC

## 2021-11-03 MED ORDER — OXYTOCIN-SODIUM CHLORIDE 30-0.9 UT/500ML-% IV SOLN
INTRAVENOUS | Status: AC
  Filled 2021-11-03: qty 500

## 2021-11-03 MED ORDER — ACETAMINOPHEN 325 MG PO TABS
650.0000 mg | ORAL_TABLET | ORAL | Status: DC | PRN
  Administered 2021-11-03 – 2021-11-04 (×2): 650 mg via ORAL
  Filled 2021-11-03 (×2): qty 2

## 2021-11-03 MED ORDER — SIMETHICONE 80 MG PO CHEW: 80.0000 mg | CHEWABLE_TABLET | ORAL | Status: DC | PRN

## 2021-11-03 MED ORDER — BENZOCAINE-MENTHOL 20-0.5 % EX AERO: 1.0000 "application " | INHALATION_SPRAY | CUTANEOUS | Status: DC | PRN

## 2021-11-03 MED ORDER — CEFAZOLIN SODIUM-DEXTROSE 2-4 GM/100ML-% IV SOLN
2.0000 g | Freq: Once | INTRAVENOUS | Status: AC
  Administered 2021-11-03: 2 g via INTRAVENOUS
  Filled 2021-11-03: qty 100

## 2021-11-03 MED ORDER — FENTANYL-BUPIVACAINE-NACL 0.5-0.125-0.9 MG/250ML-% EP SOLN
EPIDURAL | Status: AC
  Filled 2021-11-03: qty 250

## 2021-11-03 MED ORDER — TERBUTALINE SULFATE 1 MG/ML IJ SOLN
INTRAMUSCULAR | Status: AC
  Administered 2021-11-03: 0.25 mg
  Filled 2021-11-03: qty 1

## 2021-11-03 MED ORDER — LACTATED RINGERS IV SOLN: 500.0000 mL | INTRAVENOUS | Status: DC | PRN

## 2021-11-03 MED ORDER — COCONUT OIL OIL: 1.0000 "application " | TOPICAL_OIL | Status: DC | PRN

## 2021-11-03 MED ORDER — LACTATED RINGERS IV SOLN: 500.0000 mL | Freq: Once | INTRAVENOUS | Status: DC

## 2021-11-03 MED ORDER — PHENYLEPHRINE 80 MCG/ML (10ML) SYRINGE FOR IV PUSH (FOR BLOOD PRESSURE SUPPORT)
80.0000 ug | PREFILLED_SYRINGE | INTRAVENOUS | Status: DC | PRN
  Filled 2021-11-03: qty 10

## 2021-11-03 MED ORDER — ONDANSETRON HCL 4 MG/2ML IJ SOLN: 4.0000 mg | INTRAMUSCULAR | Status: DC | PRN

## 2021-11-03 MED ORDER — IBUPROFEN 600 MG PO TABS
600.0000 mg | ORAL_TABLET | Freq: Four times a day (QID) | ORAL | Status: DC
  Administered 2021-11-03 – 2021-11-04 (×4): 600 mg via ORAL
  Filled 2021-11-03 (×4): qty 1

## 2021-11-03 MED ORDER — PRENATAL MULTIVITAMIN CH
1.0000 | ORAL_TABLET | Freq: Every day | ORAL | Status: DC
  Administered 2021-11-03 – 2021-11-04 (×2): 1 via ORAL
  Filled 2021-11-03 (×2): qty 1

## 2021-11-03 MED ORDER — OXYCODONE HCL 5 MG PO TABS
5.0000 mg | ORAL_TABLET | ORAL | Status: DC | PRN
  Administered 2021-11-03: 5 mg via ORAL
  Filled 2021-11-03 (×2): qty 1

## 2021-11-03 MED ORDER — PHENYLEPHRINE 80 MCG/ML (10ML) SYRINGE FOR IV PUSH (FOR BLOOD PRESSURE SUPPORT)
80.0000 ug | PREFILLED_SYRINGE | INTRAVENOUS | Status: DC | PRN
  Administered 2021-11-03: 80 ug via INTRAVENOUS

## 2021-11-03 MED ORDER — CEFAZOLIN SODIUM-DEXTROSE 1-4 GM/50ML-% IV SOLN: 1.0000 g | Freq: Three times a day (TID) | INTRAVENOUS | Status: DC

## 2021-11-03 MED ORDER — DIPHENHYDRAMINE HCL 50 MG/ML IJ SOLN: 12.5000 mg | INTRAMUSCULAR | Status: DC | PRN

## 2021-11-03 MED ORDER — DIBUCAINE (PERIANAL) 1 % EX OINT: 1.0000 "application " | TOPICAL_OINTMENT | CUTANEOUS | Status: DC | PRN

## 2021-11-03 MED ORDER — DIPHENHYDRAMINE HCL 25 MG PO CAPS: 25.0000 mg | ORAL_CAPSULE | Freq: Four times a day (QID) | ORAL | Status: DC | PRN

## 2021-11-03 MED ORDER — OXYTOCIN BOLUS FROM INFUSION
333.0000 mL | Freq: Once | INTRAVENOUS | Status: AC
  Administered 2021-11-03: 333 mL via INTRAVENOUS

## 2021-11-03 MED ORDER — MEASLES, MUMPS & RUBELLA VAC IJ SOLR: 0.5000 mL | Freq: Once | INTRAMUSCULAR | Status: DC

## 2021-11-03 MED ORDER — SENNOSIDES-DOCUSATE SODIUM 8.6-50 MG PO TABS
2.0000 | ORAL_TABLET | Freq: Every day | ORAL | Status: DC
  Administered 2021-11-04: 2 via ORAL
  Filled 2021-11-03: qty 2

## 2021-11-03 MED ORDER — SOD CITRATE-CITRIC ACID 500-334 MG/5ML PO SOLN: 30.0000 mL | ORAL | Status: DC | PRN

## 2021-11-03 MED ORDER — OXYCODONE-ACETAMINOPHEN 5-325 MG PO TABS: 2.0000 | ORAL_TABLET | ORAL | Status: DC | PRN

## 2021-11-03 MED ORDER — FENTANYL CITRATE (PF) 100 MCG/2ML IJ SOLN: 50.0000 ug | INTRAMUSCULAR | Status: DC | PRN

## 2021-11-03 MED ORDER — AMLODIPINE BESYLATE 5 MG PO TABS
2.5000 mg | ORAL_TABLET | Freq: Every day | ORAL | Status: DC
  Administered 2021-11-03 – 2021-11-04 (×2): 2.5 mg via ORAL
  Filled 2021-11-03 (×2): qty 1

## 2021-11-03 MED ORDER — FUROSEMIDE 20 MG PO TABS
20.0000 mg | ORAL_TABLET | Freq: Every day | ORAL | Status: DC
Start: 2021-11-04 — End: 2021-11-04
  Administered 2021-11-04: 20 mg via ORAL
  Filled 2021-11-03: qty 1

## 2021-11-03 MED ORDER — ONDANSETRON HCL 4 MG PO TABS: 4.0000 mg | ORAL_TABLET | ORAL | Status: DC | PRN

## 2021-11-03 MED ORDER — FENTANYL-BUPIVACAINE-NACL 0.5-0.125-0.9 MG/250ML-% EP SOLN
12.0000 mL/h | EPIDURAL | Status: DC | PRN
  Administered 2021-11-03: 12 mL/h via EPIDURAL

## 2021-11-03 MED ORDER — LIDOCAINE HCL (PF) 1 % IJ SOLN: 30.0000 mL | INTRAMUSCULAR | Status: DC | PRN

## 2021-11-03 MED ORDER — TETANUS-DIPHTH-ACELL PERTUSSIS 5-2.5-18.5 LF-MCG/0.5 IM SUSY: 0.5000 mL | PREFILLED_SYRINGE | Freq: Once | INTRAMUSCULAR | Status: DC

## 2021-11-03 NOTE — H&P (Signed)
LABOR AND DELIVERY ADMISSION HISTORY AND PHYSICAL NOTE  Melanie Gonzales is a 31 y.o. female G2P1001 with IUP at [redacted]w[redacted]d by 8 week [place] presenting for spontaneous labor. She was 7.5cm upon arrival to the MAU.    She reports positive fetal movement. She denies leakage of fluid or vaginal bleeding.  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 No recent outbreaks or current symptoms - Chronic hypertension on norvasc. On Norvasc pre-pregnancy as well. 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     Past Medical History: 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   Hx of migraines 08/13/2012   Hypertension    Migraines    Nipple discharge in female 07/15/2014   Vaginal Pap smear, abnormal     Past Surgical History: Past Surgical History:  Procedure Laterality Date   NO PAST SURGERIES      Obstetrical History: OB History     Gravida  2   Para  1   Term  1   Preterm      AB      Living  1      SAB      IAB      Ectopic      Multiple      Live Births  1           Social History: Social History   Socioeconomic History   Marital status: Single    Spouse name: Not on file   Number of children: 1   Years of education: Not on file   Highest education level: Not on file  Occupational History   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    Employer: [employer]  Tobacco Use   Smoking status: Former    Types: Cigars   Smokeless tobacco: Never  Building services engineer Use: Never used  Substance and Sexual Activity   Alcohol use: Not Currently    Comment: occ   Drug use: No   Sexual activity: Yes    Birth control/protection: None  Other Topics Concern   Not on file  Social History Narrative   Not on file   Social Determinants of Health    Financial Resource Strain: Low Risk    Difficulty of Paying Living Expenses: Not very hard  Food Insecurity: No Food Insecurity   Worried About Programme researcher, broadcasting/film/video in the Last Year: Never true   Ran Out of Food in the Last Year: Never true  Transportation Needs: No Transportation Needs   Lack of Transportation (Medical): No   Lack of Transportation (Non-Medical): No  Physical Activity: Insufficiently Active   Days of Exercise per Week: 2 days   Minutes of Exercise per Session: 40 min  Stress: Stress Concern Present   Feeling of Stress : To some extent  Social Connections: Socially Isolated   Frequency of Communication with Friends and Family: Three times a week   Frequency of Social Gatherings with Friends and Family: Twice a week   Attends Religious Services: Never   Database administrator or Organizations: No   Attends Engineer, structural: Never   Marital Status: Never married    Family History: Family History  Problem Relation Age of Onset   Ulcers Mother    GER disease  Mother    Deep vein thrombosis Father    Seizures Daughter    Hyperthyroidism Daughter    Other Daughter        meningitis 2 weeks after birth   Hypertension Paternal Grandmother    Thyroid disease Paternal Grandmother        has had thyroid removed   Gallbladder disease Neg Hx    Colon cancer Neg Hx    Crohn's disease Neg Hx     Allergies: Allergies  Allergen Reactions   Hydrocodone Itching   Penicillins Hives    Medications Prior to Admission  Medication Sig Dispense Refill Last Dose   acyclovir (ZOVIRAX) 400 MG tablet Take 1 tablet (400 mg total) by mouth 3 (three) times daily. 90 tablet 2 11/02/2021   amLODipine (NORVASC) 2.5 MG tablet TAKE 1 TABLET(2.5 MG) BY MOUTH DAILY 90 tablet 3 11/02/2021   aspirin 81 MG EC tablet Take 2 tablets (162 mg total) by mouth daily. Swallow whole. 180 tablet 2 11/02/2021   butalbital-acetaminophen-caffeine (FIORICET) 50-325-40 MG tablet Take 1 tablet  by mouth every 4 (four) hours as needed for headache or migraine. 20 tablet 0 Past Week   Prenatal Vit-Fe Fumarate-FA (PRENATAL VITAMIN) 27-0.8 MG TABS Take 1 tablet by mouth daily. 100 tablet 11 11/02/2021   Blood Pressure Monitor MISC 1 each by Does not apply route once a week. 1 each 0    Doxylamine-Pyridoxine 10-10 MG TBEC 2 at bedtime on day 1 & 2; if symptoms persist, take 1 tablet in am and 2 tablets at bedtime on day 3; if symptoms persist, may increase to 1 tablet in am, 1 tablet afternoon, and 2 tablets at bedtime on day 4 (Patient not taking: Reported on 11/02/2021) 60 tablet 3    erythromycin ophthalmic ointment Place 1 application. into the left eye 4 (four) times daily. ~ 1 cm ribbon. (Patient not taking: Reported on 11/02/2021) 3.5 g 0    ondansetron (ZOFRAN) 4 MG tablet Take 1 tablet (4 mg total) by mouth every 8 (eight) hours as needed for nausea or vomiting. (Patient not taking: Reported on 11/02/2021) 20 tablet 0      Review of Systems  All systems reviewed and negative except as stated in HPI  Physical Exam Blood pressure 131/84, pulse 95, temperature 98.2 F (36.8 C), temperature source Oral, resp. rate 18, height 5\' 6"  (1.676 m), weight 68 kg, SpO2 100 %. General appearance: alert, oriented, uncomfortable w/ ctx Lungs: normal respiratory effort Heart: regular rate Abdomen: soft, non-tender; gravid, FH appropriate for GA Extremities: No calf swelling or tenderness Pelvic: no vulvar or external vaginal canal lesions seen  Presentation: cephalic Fetal monitoring: 135/mod/15x15/none Uterine activity: every 2-4 min  Dilation: 7.5 Effacement (%): 90 Station: -1, 0 Exam by:: Quintella BatonJO Barham rNC  Prenatal labs: ABO, Rh: --/--/PENDING (06/07 0353) Antibody: PENDING (06/07 0353) Rubella: 3.51 (12/07 1015) RPR: Non Reactive (03/16 0839)  HBsAg: Negative (12/07 1015)  HIV: Non Reactive (03/16 0839)  GC/Chlamydia: neg GBS: Negative/-- (05/23 1620)  2-hr GTT: passed Genetic  screening: low risk  Anatomy US: normal   Prenatal Transfer Tool  Maternal Diabetes: No Genetic Screening: Normal Maternal Ultrasounds/Referrals: Normal Fetal Ultrasounds or other Referrals:  None Maternal Substance Abuse:  No Significant Maternal Medications:  None Significant Maternal Lab Results: Group B Strep negative  Results for orders placed or performed during the hospital encounter of 11/03/21 (from the past 24 hour(s))  CBC   Collection Time: 11/03/21  3:53 AM  Result Value Ref  Range   WBC 11.3 (H) 4.0 - 10.5 K/uL   RBC 3.57 (L) 3.87 - 5.11 MIL/uL   Hemoglobin 11.8 (L) 12.0 - 15.0 g/dL   HCT 93.7 (L) 90.2 - 40.9 %   MCV 95.8 80.0 - 100.0 fL   MCH 33.1 26.0 - 34.0 pg   MCHC 34.5 30.0 - 36.0 g/dL   RDW 73.5 32.9 - 92.4 %   Platelets 270 150 - 400 K/uL   nRBC 0.0 0.0 - 0.2 %  Type and screen [facility]   Collection Time: 11/03/21  3:53 AM  Result Value Ref Range   ABO/RH(D) PENDING    Antibody Screen PENDING    Sample Expiration      11/06/2021,2359 Performed at [facility], [address], [place], [place] [address]   Results for orders placed or performed in visit on 11/02/21 (from the past 24 hour(s))  POC Urinalysis Dipstick OB   Collection Time: 11/02/21  3:27 PM  Result Value Ref Range   Color, UA     Clarity, UA     Glucose, UA Negative Negative   Bilirubin, UA     Ketones, UA neg    Spec Grav, UA     Blood, UA neg    pH, UA     POC,PROTEIN,UA Negative Negative, Trace, Small (1+), Moderate (2+), Large (3+), 4+   Urobilinogen, UA     Nitrite, UA neg    Leukocytes, UA Negative Negative   Appearance     Odor      Patient Active Problem List   Diagnosis Date Noted   Indication for care in labor and delivery, antepartum 11/03/2021   H/O postpartum Preeclampsia 05/05/2021   Chronic hypertension affecting pregnancy 05/05/2021   Supervision of high-risk pregnancy 05/04/2021   Essential hypertension 11/19/2020   Anxiety  12/26/2017   Migraine without aura and without status migrainosus, not intractable 03/05/2016   ASCUS favor benign 12/10/2015   History of nipple discharge 12/10/2015   GERD (gastroesophageal reflux disease) 10/11/2013   Irritable bowel syndrome 10/11/2013   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 G2P1001 at [redacted]w[redacted]d here for SOL.   #Labor: Allow expectant management. Expect SVD soon.  #Pain: Getting epidural shortly.  #FWB: Cat I  #ID: GBS negative-- however with history of infant with invasive GBS disease and reported GBS negative status at that time, plan for prophylactic antibiotics. She has a mild PCN allergy, plan for Ancef.  #MOF: Breast #MOC: Unsure  #Circ: yes   #HSV: no lesions seen without any symptoms or recent outbreak.   #Chronic hypertension: BP mild range. No symptoms. Plan to continue home Norvasc postpartum as well.   Allayne Stack 11/03/2021, 4:19 AM

## 2021-11-03 NOTE — MAU Note (Signed)
.  Melanie Gonzales is a 31 y.o. at [redacted]w[redacted]d here in MAU reporting ctxs since 1800. Some bloody show. 4.5cm last sve. Good FM. Pt arrived via EMS to Rm # [phone]. Pt alert and oriented Onset of complaint: 1800 Pain score: 9 Vitals:   11/03/21 0336  BP: 137/90  Pulse: 94  Resp: 17  Temp: 98.1 F (36.7 C)  SpO2: 100%     FHT:133 Lab orders placed from triage:

## 2021-11-03 NOTE — Anesthesia Preprocedure Evaluation (Signed)
Anesthesia Evaluation  Patient identified by MRN, date of birth, ID band Patient awake    Reviewed: Allergy & Precautions, Patient's Chart, lab work & pertinent test results  Airway Mallampati: II       Dental no notable dental hx.    Pulmonary neg pulmonary ROS, former smoker,    Pulmonary exam normal        Cardiovascular hypertension, Pt. on medications Normal cardiovascular exam     Neuro/Psych  Headaches, Anxiety    GI/Hepatic Neg liver ROS, GERD  Medicated,  Endo/Other  negative endocrine ROS  Renal/GU negative Renal ROS  negative genitourinary   Musculoskeletal negative musculoskeletal ROS (+)   Abdominal   Peds  Hematology  (+) Blood dyscrasia, anemia ,   Anesthesia Other Findings   Reproductive/Obstetrics (+) Pregnancy                             Anesthesia Physical Anesthesia Plan  ASA: 2  Anesthesia Plan: Epidural   Post-op Pain Management:    Induction:   PONV Risk Score and Plan:   Airway Management Planned: Natural Airway  Additional Equipment:   Intra-op Plan:   Post-operative Plan:   Informed Consent: I have reviewed the patients History and Physical, chart, labs and discussed the procedure including the risks, benefits and alternatives for the proposed anesthesia with the patient or authorized representative who has indicated his/her understanding and acceptance.       Plan Discussed with: Anesthesiologist  Anesthesia Plan Comments:         Anesthesia Quick Evaluation

## 2021-11-03 NOTE — Progress Notes (Signed)
Went to bedside again as starting to have recurrent variables again, now more deep.   Rechecked cervix, still about 7.5cm but now about -1 with some puffiness to the anterior cervix. Tried several position changes without success. Gave terb at 0707 with some improvement, mod var in between. FSE placed.   Discussed with mom that if the FHT continues to have persistent deep variables despite measures above without cervical change, that we may need to proceed with C/S. She endorsed understanding.   Will monitor.   Allayne Stack, DO

## 2021-11-03 NOTE — Progress Notes (Signed)
Went to bedside due to recurrent moderate variables with occasional late decelerations on toco that had not resolved with usual measures (IV fluid bolus, position changes, BP management).   Checked cervix, previously called 9cm but still feels to be about 7-7.5cm (essentially unchanged from MAU check). After verbal consent, performed AROM with clear fluid (with bloody show already present) and placed an IUPC with amnioinfusion started.   Will monitor closely.   Allayne Stack, DO

## 2021-11-03 NOTE — Progress Notes (Signed)
PT to BS via stretcher. FHR 130 when EFM removed for transfer to [facility]

## 2021-11-03 NOTE — Lactation Note (Addendum)
This note was copied from a baby's chart. Lactation Consultation Note  Patient Name: Melanie Gonzales JIRCV'[id number] Date: 11/03/2021 Reason for consult: Initial assessment;Early term 37-38.6wks Age:30 hours P2, ETI female infant. Per mom, BF is going well, infant recently finished breastfeeding as LC entered the room, infant BF for 27 minutes. Per mom, she has been latching infant on both breast during a feeding. Mom doesn't have any questions or concerns for LC at this time.  Mom will continue to breastfeed infant according to hunger cues, on demand, 8 to 12+ times within 24 hours, skin to skin. Mom made aware of O/P services, breastfeeding support groups, community resources, and our phone # for post-discharge questions.   Maternal Data Has patient been taught Hand Expression?: Yes Does the patient have breastfeeding experience prior to this delivery?: Yes How long did the patient breastfeed?: Per mom, she BF 1st child for 3 weeks who is currently 60 years old now.  Feeding Mother's Current Feeding Choice: Breast Milk and Formula  LATCH Score   Lactation Tools Discussed/Used    Interventions Interventions: Breast feeding basics reviewed;Skin to skin;Position options;Education;LC Services brochure  Discharge    Consult Status Consult Status: Follow-up Date: 11/04/21 Follow-up type: In-patient    Danelle Earthly 11/03/2021, 5:40 PM

## 2021-11-03 NOTE — Lactation Note (Signed)
This note was copied from a baby's chart. Lactation Consultation Note  Patient Name: Melanie Gonzales Date: 11/03/2021 Reason for consult: L&D Initial assessment;Early term 37-38.6wks Age:30 hours  I assisted with latching infant. Infant latched with ease & Mom was comfortable with latch. Swallows verified by cervical auscultation.   Maternal Data Does the patient have breastfeeding experience prior to this delivery?: Yes How long did the patient breastfeed?: 3 weeks  Interventions: Education;Assisted with latch;Skin to skin;Adjust position  Discharge [facility] Program: No  Consult Status Consult Status: Follow-up from L&D  Lurline Hare [facility] 11/03/2021, 10:03 AM

## 2021-11-03 NOTE — Discharge Summary (Signed)
Postpartum Discharge Summary  Date of Service updated     Patient Name: Melanie Gonzales DOB: December 27, 1990 MRN: [id number]  Date of admission: 11/03/2021 Delivery date:11/03/2021  Delivering provider: Genia Del  Date of discharge: 11/04/2021  Admitting diagnosis: Indication for care in labor and delivery, antepartum [O75.9] Intrauterine pregnancy: [redacted]w[redacted]d    Secondary diagnosis:  Principal Problem:   Vaginal delivery Active Problems:   HSV-2 seropositive   Supervision of high-risk pregnancy   H/O postpartum Preeclampsia   Chronic hypertension affecting pregnancy   Nexplanon in place  Additional problems: None    Discharge diagnosis: Term Pregnancy Delivered                                              Post partum procedures:  Nexplanon placement  Augmentation: AROM Complications: None  Hospital course: Onset of Labor With Vaginal Delivery      31y.o. yo G2P1001 at 327w1das admitted in Active Labor on 11/03/2021. Patient had an uncomplicated labor course as follows:  Membrane Rupture Time/Date: 6:20 AM ,11/03/2021   Delivery Method:Vaginal, Spontaneous  Episiotomy: None  Lacerations:  None  Patient had an uncomplicated postpartum course.  She was continued on her home Norvasc 2.5 mg with good result.  She was also started on Lasix, which she will continue upon discharge to complete a 5 day course.  She is ambulating, tolerating a regular diet, passing flatus, and urinating well.  Her pain and bleeding are controlled.  Patient is discharged home in stable condition on 11/04/21.  Newborn Data: Birth date:11/03/2021  Birth time:9:01 AM  Gender:Female  Living status:Living  Apgars:8 ,9  Weight:3140 g   Magnesium Sulfate received: No BMZ received: No Rhophylac: N/A MMR: N/A T-DaP: Given prenatally Flu: No Transfusion: No  Physical exam  Vitals:   11/03/21 1625 11/03/21 1940 11/03/21 2330 11/04/21 0535  BP: 119/78 125/74 120/76 (!) 100/59  Pulse: 80 73 80 69   Resp: _0 Temp: 97.7 F (36.5 C) 98.7 F (37.1 C) 98.2 F (36.8 C) 98.1 F (36.7 C)  TempSrc: Oral Oral Oral Oral  SpO2: 99% 100% 100% 99%  Weight:      Height:       General: alert, cooperative, and no distress Lochia: appropriate Uterine Fundus: firm Incision: N/A DVT Evaluation: No significant calf/ankle edema.  Labs: Lab Results  Component Value Date   WBC 11.3 (H) 11/03/2021   HGB 11.8 (L) 11/03/2021   HCT 34.2 (L) 11/03/2021   MCV 95.8 11/03/2021   PLT 270 11/03/2021      Latest Ref Rng & Units 10/12/2021    4:47 PM  CMP  Glucose 70 - 99 mg/dL 76   BUN 6 - 20 mg/dL 8   Creatinine 0.57 - 1.00 mg/dL 0.52   Sodium 134 - 144 mmol/L 136   Potassium 3.5 - 5.2 mmol/L 4.3   Chloride 96 - 106 mmol/L 101   CO2 20 - 29 mmol/L 22   Calcium 8.7 - 10.2 mg/dL 9.3   Total Protein 6.0 - 8.5 g/dL 6.6   Total Bilirubin 0.0 - 1.2 mg/dL 0.2   Alkaline Phos 44 - 121 IU/L 152   AST 0 - 40 IU/L 21   ALT 0 - 32 IU/L 17    Edinburgh Score:     No data to display  After visit meds:  Allergies as of 11/04/2021       Reactions   Hydrocodone Itching   Penicillins Hives        Medication List     STOP taking these medications    acyclovir 400 MG tablet Commonly known as: ZOVIRAX   aspirin EC 81 MG tablet   Doxylamine-Pyridoxine 10-10 MG Tbec   erythromycin ophthalmic ointment   ondansetron 4 MG tablet Commonly known as: Zofran       TAKE these medications    acetaminophen 325 MG tablet Commonly known as: Tylenol Take 2 tablets (650 mg total) by mouth every 4 (four) hours as needed (for pain scale < 4).   amLODipine 2.5 MG tablet Commonly known as: NORVASC TAKE 1 TABLET(2.5 MG) BY MOUTH DAILY   Blood Pressure Monitor Misc 1 each by Does not apply route once a week.   butalbital-acetaminophen-caffeine 50-325-40 MG tablet Commonly known as: FIORICET Take 1 tablet by mouth every 4 (four) hours as needed for headache or migraine.    furosemide 20 MG tablet Commonly known as: LASIX Take 1 tablet (20 mg total) by mouth daily for 4 days.   ibuprofen 600 MG tablet Commonly known as: ADVIL Take 1 tablet (600 mg total) by mouth every 6 (six) hours as needed.   Prenatal Vitamin 27-0.8 MG Tabs Take 1 tablet by mouth daily.         Discharge home in stable condition Infant Feeding: Breast Infant Disposition:home with mother Discharge instruction: per After Visit Summary and Postpartum booklet. Activity: Advance as tolerated. Pelvic rest for 6 weeks.  Diet: routine diet Future Appointments: Future Appointments  Date Time Provider [place]  11/10/2021 11:10 AM [facility] NURSE [facility]  12/14/2021 10:10 AM Luvenia Redden, PA-C [facility]   Follow up Visit: Message sent to [facility] by Dr. Gwenlyn Perking on 11/03/21.   Please schedule this patient for a In person postpartum visit in 6 weeks with the following provider: Any provider. Additional Postpartum F/U: BP check 1 week  High risk pregnancy complicated by: HTN Delivery mode:  Vaginal, Spontaneous  Anticipated Birth Control:  Unsure> nexplanon then done inpatient   11/04/2021 Patriciaann Clan, DO

## 2021-11-03 NOTE — Anesthesia Procedure Notes (Signed)
Epidural Patient location during procedure: OB Start time: 11/03/2021 4:28 AM End time: 11/03/2021 4:36 AM  Staffing Anesthesiologist: Josephine Igo, MD Performed: anesthesiologist   Preanesthetic Checklist Completed: patient identified, IV checked, site marked, risks and benefits discussed, surgical consent, monitors and equipment checked, pre-op evaluation and timeout performed  Epidural Patient position: sitting Prep: DuraPrep and site prepped and draped Patient monitoring: continuous pulse ox and blood pressure Approach: midline Location: L3-L4 Injection technique: LOR air  Needle:  Needle type: Tuohy  Needle gauge: 17 G Needle length: 9 cm and 9 Needle insertion depth: 5 cm cm Catheter type: closed end flexible Catheter size: 19 Gauge Catheter at skin depth: 10 cm Test dose: negative and Other  Assessment Events: blood not aspirated, injection not painful, no injection resistance, no paresthesia and negative IV test  Additional Notes Patient identified. Risks and benefits discussed including failed block, incomplete  Pain control, post dural puncture headache, nerve damage, paralysis, blood pressure Changes, nausea, vomiting, reactions to medications-both toxic and allergic and post Partum back pain. All questions were answered. Patient expressed understanding and wished to proceed. Sterile technique was used throughout procedure. Epidural site was Dressed with sterile barrier dressing. No paresthesias, signs of intravascular injection Or signs of intrathecal spread were encountered.  Patient was more comfortable after the epidural was dosed. Please see RN's note for documentation of vital signs and FHR which are stable. Reason for block:procedure for pain

## 2021-11-04 ENCOUNTER — Other Ambulatory Visit (HOSPITAL_COMMUNITY): Payer: Self-pay

## 2021-11-04 ENCOUNTER — Encounter: Payer: Self-pay | Admitting: Obstetrics & Gynecology

## 2021-11-04 DIAGNOSIS — Z30017 Encounter for initial prescription of implantable subdermal contraceptive: Secondary | ICD-10-CM

## 2021-11-04 DIAGNOSIS — Z975 Presence of (intrauterine) contraceptive device: Secondary | ICD-10-CM

## 2021-11-04 MED ORDER — LIDOCAINE HCL 1 % IJ SOLN
0.0000 mL | Freq: Once | INTRAMUSCULAR | Status: AC | PRN
  Administered 2021-11-04: 3 mL via INTRADERMAL
  Filled 2021-11-04: qty 20

## 2021-11-04 MED ORDER — ACETAMINOPHEN 325 MG PO TABS: 650.0000 mg | ORAL_TABLET | ORAL | Status: DC | PRN

## 2021-11-04 MED ORDER — ETONOGESTREL 68 MG ~~LOC~~ IMPL
68.0000 mg | DRUG_IMPLANT | Freq: Once | SUBCUTANEOUS | Status: AC
  Administered 2021-11-04: 68 mg via SUBCUTANEOUS
  Filled 2021-11-04: qty 1

## 2021-11-04 MED ORDER — IBUPROFEN 600 MG PO TABS
600.0000 mg | ORAL_TABLET | Freq: Four times a day (QID) | ORAL | 0 refills | Status: DC | PRN
  Filled 2021-11-04: qty 60, 15d supply, fill #0

## 2021-11-04 MED ORDER — FUROSEMIDE 20 MG PO TABS
20.0000 mg | ORAL_TABLET | Freq: Every day | ORAL | 0 refills | Status: DC
  Filled 2021-11-04: qty 4, 4d supply, fill #0

## 2021-11-04 NOTE — Procedures (Addendum)
  Nexplanon Insertion Procedure:  SN [id number] Exp: 2025 April 10  Catha Gosselin requests insertion of Nexplanon for contraception method.  She understands the risks associated with insertion including pain, bleeding, infection, and paresthesias of the arm.  Patient also understands that Nexplanon can cause change in bleeding.  However, patient accepts and understands all these risks and desires to proceed.  Nexplanon inserted as below.  Informed consent signed.   Appropriate time out taken.  Patient's non-dominant left arm was identified prepped and draped in the usual sterile fashion. The area was identified ~8 cm from epicondyle and 4 cm posterior to the sulcus between the biceps and tricep muscles. The area was prepped with alcohol swab and then injected with 44mL of 1% lidocaine along the anticipated insertion route.  The area was then prepped with betadine and allowed 60 seconds before being wiped away with sterile gauze. Nexplanon was removed from packaging and device was confirmed within needle by provider visualization.   The device was then inserted per manufacturers instruction without complications.  The device was then palpated in the patient's arm by patient and provider. The insertion area was hemostatic with pressure and covered with steri strips.  The arm was then wrapped with pressure dressing.  The patient tolerated the procedure well and was given post procedure instructions to include: *Remove of pressure dressing and band-aid in 12-24 hours. *Remove of steri-strips after no more than 7 days. *Tylenol or ibuprofen for insertion pain/discomfort. *Call for any other questions, concerns, or complications.   Cherre Robins MSN, CNM 11/04/2021

## 2021-11-04 NOTE — Lactation Note (Signed)
This note was copied from a baby's chart. Lactation Consultation Note  Patient Name: Melanie Gonzales [id number] Date: 11/04/2021 Reason for consult: Follow-up assessment;Early term 37-38.6wks;Infant weight loss (-2% weight loss .) Age:30 hours LC discuss with mom discharge education:Mom is knowledgeable about engorgement treatment and prevention, how to know breastfeeding is doing well with infant, infant's I&O first week of life, signs of dehydration in infant. [employer] reviewed with mom community resources after hospital discharge: [facility] outpatient clinic, [facility] hotline, [place] breastfeeding support groups, online resource " [email or web address]".   Maternal Data    Feeding Mother's Current Feeding Choice: Breast Milk  LATCH Score                    Lactation Tools Discussed/Used    Interventions    Discharge Discharge Education: Engorgement and breast care;Warning signs for feeding baby;Outpatient recommendation  Consult Status Consult Status: Complete Date: 11/04/21 Follow-up type: Physician    Vicente Serene 11/04/2021, 3:45 PM

## 2021-11-04 NOTE — Plan of Care (Signed)
Adequate for discharge.

## 2021-11-04 NOTE — Anesthesia Postprocedure Evaluation (Signed)
Anesthesia Post Note  Patient: Tyrhonda Georgiades  Procedure(s) Performed: AN AD HOC LABOR EPIDURAL     Patient location during evaluation: Mother Baby Anesthesia Type: Epidural Level of consciousness: awake, awake and alert and oriented Pain management: pain level controlled Vital Signs Assessment: post-procedure vital signs reviewed and stable Respiratory status: spontaneous breathing, nonlabored ventilation and respiratory function stable Cardiovascular status: stable Postop Assessment: patient able to bend at knees, no headache, no apparent nausea or vomiting, adequate PO intake and able to ambulate Anesthetic complications: no   No notable events documented.  Last Vitals:  Vitals:   11/03/21 2330 11/04/21 0535  BP: 120/76 (!) 100/59  Pulse: 80 69  Resp: 18 18  Temp: 36.8 C 36.7 C  SpO2: 100% 99%    Last Pain:  Vitals:   11/04/21 0903  TempSrc:   PainSc: 4    Pain Goal: Patients Stated Pain Goal: 0 (11/03/21 0340)                 Nida Manfredi

## 2021-11-05 ENCOUNTER — Other Ambulatory Visit: Payer: Medicaid Other

## 2021-11-09 ENCOUNTER — Encounter: Payer: Medicaid Other | Admitting: Women's Health

## 2021-11-09 ENCOUNTER — Other Ambulatory Visit: Payer: Medicaid Other

## 2021-11-10 ENCOUNTER — Ambulatory Visit (INDEPENDENT_AMBULATORY_CARE_PROVIDER_SITE_OTHER): Payer: Medicaid Other | Admitting: *Deleted

## 2021-11-10 ENCOUNTER — Encounter: Payer: Self-pay | Admitting: *Deleted

## 2021-11-10 VITALS — BP 125/84 | HR 78 | Ht 66.0 in | Wt 138.0 lb

## 2021-11-10 DIAGNOSIS — Z0130 Encounter for examination of blood pressure without abnormal findings: Secondary | ICD-10-CM

## 2021-11-10 NOTE — Progress Notes (Signed)
   NURSE VISIT- BLOOD PRESSURE CHECK  SUBJECTIVE:  Melanie Gonzales is a 31 y.o. G59P2002 female here for BP check. She is postpartum, delivery date 11/03/21     HYPERTENSION ROS:  Pregnant/postpartum:  Severe headaches that don't go away with tylenol/other medicines: No  Visual changes (seeing spots/double/blurred vision) No  Severe pain under right breast breast or in center of upper chest No  Severe nausea/vomiting No  Taking medicines as instructed yes    OBJECTIVE:  BP 125/84 (BP Location: Right Arm, Patient Position: Sitting, Cuff Size: Normal)   Pulse 78   Ht 5\' 6"  (1.676 m)   Wt 138 lb (62.6 kg)   LMP  (LMP Unknown)   Breastfeeding Yes   BMI 22.27 kg/m   Appearance alert, well appearing, and in no distress.  ASSESSMENT: Postpartum  blood pressure check  PLAN: Discussed with Derrill Memo, CNM   Recommendations: no changes needed; keep taking Norvasc 2.5 mg daily  Follow-up: as scheduled for postpartum visit   Levy Pupa  11/10/2021 11:31 AM

## 2021-11-12 ENCOUNTER — Other Ambulatory Visit: Payer: Medicaid Other

## 2021-11-16 ENCOUNTER — Other Ambulatory Visit: Payer: Medicaid Other

## 2021-11-16 ENCOUNTER — Encounter: Payer: Medicaid Other | Admitting: Women's Health

## 2021-11-19 ENCOUNTER — Encounter: Payer: Self-pay | Admitting: Obstetrics & Gynecology

## 2021-11-21 ENCOUNTER — Other Ambulatory Visit: Payer: Self-pay | Admitting: Adult Health

## 2021-11-22 ENCOUNTER — Telehealth: Payer: Self-pay | Admitting: Obstetrics & Gynecology

## 2021-11-22 NOTE — Telephone Encounter (Signed)
Patient sent this through mychart wanting an appointment with Cyril Mourning. She hasn't had her postpartum visit yet. I sent this back just in case somebody wanted to call her about this: Headaches, neck pain. Happens often. Tylenol or ibuprofen helps for a couple of hours and then pain resumes. Not sure if the head pain is coming from my neck or neck pain from my headache. Some bp readings have been higher than normal. Please advise.

## 2021-11-25 ENCOUNTER — Ambulatory Visit (INDEPENDENT_AMBULATORY_CARE_PROVIDER_SITE_OTHER): Payer: Medicaid Other | Admitting: Adult Health

## 2021-11-25 ENCOUNTER — Encounter: Payer: Self-pay | Admitting: Adult Health

## 2021-11-25 VITALS — BP 138/90 | HR 72 | Ht 66.0 in | Wt 139.0 lb

## 2021-11-25 DIAGNOSIS — G44209 Tension-type headache, unspecified, not intractable: Secondary | ICD-10-CM | POA: Diagnosis not present

## 2021-11-25 DIAGNOSIS — R030 Elevated blood-pressure reading, without diagnosis of hypertension: Secondary | ICD-10-CM | POA: Insufficient documentation

## 2021-11-25 DIAGNOSIS — I10 Essential (primary) hypertension: Secondary | ICD-10-CM | POA: Insufficient documentation

## 2021-11-25 DIAGNOSIS — M542 Cervicalgia: Secondary | ICD-10-CM | POA: Insufficient documentation

## 2021-11-25 NOTE — Progress Notes (Signed)
  Subjective:     Patient ID: Melanie Gonzales, female   DOB: [birth year], 30 y.o.   MRN: [id number]  HPI Melanie Gonzales is a 31 year old black female,single, G2P2 in complaining of headache not relieved by tylenol, nas pain in right side of neck and right ear hurts. Has had elevated BP, is on 2.5 mg Norvasc. She had vaginal delivery 11/03/21. And has Nexplanon. Lab Results  Component Value Date   DIAGPAP  11/19/2020    - Negative for intraepithelial lesion or malignancy (NILM)   HPV NOT DETECTED 01/24/2018   HPVHIGH Negative 11/19/2020     Review of Systems Has headache Has pain in right side neck Has pain right ear Reviewed past medical,surgical, social and family history. Reviewed medications and allergies.     Objective:   Physical Exam BP 138/90 (BP Location: Left Arm, Patient Position: Sitting, Cuff Size: Normal)   Pulse 72   Ht 5\' 6"  (1.676 m)   Wt 139 lb (63 kg)   Breastfeeding Yes   BMI 22.44 kg/m     Skin warm and dry. Lungs: clear to ausculation bilaterally. Cardiovascular: regular rate and rhythm.  Ears clear, has tenderness back of neck on right. CN 2-12 intact. Discussed with Dr . Fall risk is low  Upstream - 11/25/21 1416       Pregnancy Intention Screening   Does the patient want to become pregnant in the next year? No    Does the patient's partner want to become pregnant in the next year? No    Would the patient like to discuss contraceptive options today? No      Contraception Wrap Up   Current Method Hormonal Implant    End Method Hormonal Implant             Assessment:     1. Elevated BP without diagnosis of hypertension Will check labs  - CBC - Comprehensive metabolic panel - Protein / creatinine ratio, urine  2. Neck pain on right side Felt to be muscle, try alternating heat and ice   3. Muscle contraction headache   4. Hypertension, unspecified type Check BP at home tonight and in am and call me in am with results     Plan:      Keep appt as scheduled

## 2021-11-26 ENCOUNTER — Telehealth: Payer: Self-pay | Admitting: Adult Health

## 2021-11-26 LAB — COMPREHENSIVE METABOLIC PANEL
ALT: 25 IU/L (ref 0–32)
AST: 21 IU/L (ref 0–40)
Albumin/Globulin Ratio: 1.4 (ref 1.2–2.2)
Albumin: 4.4 g/dL (ref 3.9–5.0)
Alkaline Phosphatase: 109 IU/L (ref 44–121)
BUN/Creatinine Ratio: 23 (ref 9–23)
BUN: 12 mg/dL (ref 6–20)
Bilirubin Total: 0.3 mg/dL (ref 0.0–1.2)
CO2: 22 mmol/L (ref 20–29)
Calcium: 9.1 mg/dL (ref 8.7–10.2)
Chloride: 104 mmol/L (ref 96–106)
Creatinine, Ser: 0.52 mg/dL — ABNORMAL LOW (ref 0.57–1.00)
Globulin, Total: 3.1 g/dL (ref 1.5–4.5)
Glucose: 86 mg/dL (ref 70–99)
Potassium: 3.9 mmol/L (ref 3.5–5.2)
Sodium: 143 mmol/L (ref 134–144)
Total Protein: 7.5 g/dL (ref 6.0–8.5)
eGFR: 129 mL/min/{1.73_m2} (ref >59)

## 2021-11-26 LAB — CBC
Hematocrit: 40 % (ref 34.0–46.6)
Hemoglobin: 13.5 g/dL (ref 11.1–15.9)
MCH: 31.7 pg (ref 26.6–33.0)
MCHC: 33.8 g/dL (ref 31.5–35.7)
MCV: 94 fL (ref 79–97)
Platelets: 415 10*3/uL (ref 150–450)
RBC: 4.26 x10E6/uL (ref 3.77–5.28)
RDW: 12.4 % (ref 11.7–15.4)
WBC: 5.3 10*3/uL (ref 3.4–10.8)

## 2021-11-26 LAB — PROTEIN / CREATININE RATIO, URINE
Creatinine, Urine: 52.8 mg/dL
Protein, Ur: 9.9 mg/dL
Protein/Creat Ratio: 188 mg/g creat (ref 0–200)

## 2021-11-26 MED ORDER — LISINOPRIL 10 MG PO TABS: 10.0000 mg | ORAL_TABLET | Freq: Every day | ORAL | 3 refills | Status: DC

## 2021-11-26 NOTE — Telephone Encounter (Signed)
Pt fell asleep last night before checking BP. Pt is still noticing a headache. Please advise. Thanks! JSY

## 2021-11-26 NOTE — Addendum Note (Signed)
Addended by: Cyril Mourning A on: 11/26/2021 10:11 AM   Modules accepted: Orders

## 2021-11-26 NOTE — Telephone Encounter (Signed)
Patient called to give her bp results for this morning. They were 144/97. Please advise.

## 2021-11-26 NOTE — Telephone Encounter (Signed)
BP this morning was 144/97 and still has headache, is is made aware labs were normal, discussed with Dr Charlotta Newton, will stop Norvasc, and try lisinopril. Keep check on BP and return in 6 days for BP check and ROS

## 2021-12-02 ENCOUNTER — Encounter: Payer: Self-pay | Admitting: Adult Health

## 2021-12-02 ENCOUNTER — Ambulatory Visit (INDEPENDENT_AMBULATORY_CARE_PROVIDER_SITE_OTHER): Payer: Medicaid Other | Admitting: Adult Health

## 2021-12-02 VITALS — BP 132/88 | HR 83 | Ht 66.0 in | Wt 139.0 lb

## 2021-12-02 DIAGNOSIS — I10 Essential (primary) hypertension: Secondary | ICD-10-CM | POA: Diagnosis not present

## 2021-12-02 DIAGNOSIS — M542 Cervicalgia: Secondary | ICD-10-CM | POA: Diagnosis not present

## 2021-12-02 NOTE — Progress Notes (Signed)
  Subjective:     Patient ID: Melanie Gonzales, female   DOB: 1990-10-22, 31 y.o.   MRN: [id number]  HPI Melanie Gonzales is a 31 years old black female,single, G2P2 back in follow up on BP and headaches. And she is better. But milk supply has decreased.  Lab Results  Component Value Date   DIAGPAP  11/19/2020    - Negative for intraepithelial lesion or malignancy (NILM)   HPV NOT DETECTED 01/24/2018   HPVHIGH Negative 11/19/2020   PCP is Dr Adriana Simas  Review of Systems Still has neck pain on right  Headaches much better Has noticed decrease in milk supply Reviewed past medical,surgical, social and family history. Reviewed medications and allergies.     Objective:   Physical Exam BP 132/88 (BP Location: Left Arm, Patient Position: Sitting, Cuff Size: Normal)   Pulse 83   Ht 5\' 6"  (1.676 m)   Wt 139 lb (63 kg)   LMP 11/30/2021   Breastfeeding Yes   BMI 22.44 kg/m     Skin warm and dry. Lungs: clear to ausculation bilaterally. Cardiovascular: regular rate and rhythm.   Upstream - 12/02/21 1612       Pregnancy Intention Screening   Does the patient want to become pregnant in the next year? No    Does the patient's partner want to become pregnant in the next year? No    Would the patient like to discuss contraceptive options today? No      Contraception Wrap Up   Current Method Hormonal Implant    End Method Hormonal Implant             Assessment:     1. Hypertension, unspecified type Check on BP at home was 147/97 on 7/2 and 130/92 on 7/3 and 141/92 on 7/4 Continue lisinopril 10 mg 1 daily has refills  2. Neck pain on right side May refer to chiropractor     Plan:    Increase pumping to every 2 hours and increase fluids and eat before pumping, try mothers milk tea and fenugreek Keep postpartum visit as scheduled

## 2021-12-14 ENCOUNTER — Ambulatory Visit (INDEPENDENT_AMBULATORY_CARE_PROVIDER_SITE_OTHER): Payer: Medicaid Other | Admitting: Medical

## 2021-12-14 ENCOUNTER — Encounter: Payer: Self-pay | Admitting: Medical

## 2021-12-14 VITALS — BP 124/77 | HR 80 | Ht 66.0 in | Wt 136.0 lb

## 2021-12-14 DIAGNOSIS — Z975 Presence of (intrauterine) contraceptive device: Secondary | ICD-10-CM | POA: Diagnosis not present

## 2021-12-14 DIAGNOSIS — I10 Essential (primary) hypertension: Secondary | ICD-10-CM | POA: Diagnosis not present

## 2021-12-14 NOTE — Progress Notes (Signed)
Post Partum Visit Note  Melanie Gonzales is a 31 y.o. G48P2002 female who presents for a postpartum visit. She is 6 weeks postpartum following a normal spontaneous vaginal delivery.  I have fully reviewed the prenatal and intrapartum course. The delivery was at 38.1 gestational weeks.  Anesthesia: epidural. Postpartum course has been complicated by HTN and HA, neck pain. Baby is doing well. Baby is feeding by both breast and bottle - Similac . Bleeding no bleeding. Bowel function is abnormal: constipation . Bladder function is normal. Patient is not sexually active. Contraception method is Nexplanon. Postpartum depression screening: negative.   Upstream - 12/14/21 1050       Pregnancy Intention Screening   Does the patient want to become pregnant in the next year? No    Does the patient's partner want to become pregnant in the next year? No    Would the patient like to discuss contraceptive options today? No      Contraception Wrap Up   Current Method Hormonal Implant    End Method Hormonal Implant    Contraception Counseling Provided No            The pregnancy intention screening data noted above was reviewed. Potential methods of contraception were discussed. The patient elected to proceed with Hormonal Implant.   Edinburgh Postnatal Depression Scale - 12/14/21 1032       Edinburgh Postnatal Depression Scale:  In the Past 7 Days   I have been able to laugh and see the funny side of things. 0    I have looked forward with enjoyment to things. 0    I have blamed myself unnecessarily when things went wrong. 0    I have been anxious or worried for no good reason. 0    I have felt scared or panicky for no good reason. 0    Things have been getting on top of me. 0    I have been so unhappy that I have had difficulty sleeping. 0    I have felt sad or miserable. 0    I have been so unhappy that I have been crying. 0    The thought of harming myself has occurred to me. 0     Edinburgh Postnatal Depression Scale Total 0             Health Maintenance Due  Topic Date Due   COVID-19 Vaccine (3 - Moderna series) 11/07/2019    The following portions of the patient's history were reviewed and updated as appropriate: allergies, current medications, past family history, past medical history, past social history, past surgical history, and problem list.  Review of Systems Pertinent items are noted in HPI.  Objective:  BP 124/77 (BP Location: Left Arm, Patient Position: Sitting, Cuff Size: Normal)   Pulse 80   Ht 5\' 6"  (1.676 m)   Wt 136 lb (61.7 kg)   LMP 11/30/2021   Breastfeeding Yes   BMI 21.95 kg/m    General:  alert and cooperative   Breasts:  not indicated  Lungs: clear to auscultation bilaterally  Heart:  regular rate and rhythm, S1, S2 normal, no murmur, click, rub or gallop  Abdomen: Soft, non-tender    Wound N/A  GU exam:  normal       Assessment:   Normal postpartum exam CHTN Neck pain    Plan:   Essential components of care per ACOG recommendations:  1.  Mood and well being: Patient with negative depression screening  today. Reviewed local resources for support.  - Patient tobacco use? No.   - hx of drug use? No.    2. Infant care and feeding:  -Patient currently breastmilk feeding? Yes. Discussed returning to work and pumping. Reviewed importance of draining breast regularly to support lactation.  -Social determinants of health (SDOH) reviewed in EPIC. No concerns  3. Sexuality, contraception and birth spacing - Patient does not want a pregnancy in the next year.  Desired family size is 3 children.  - Reviewed reproductive life planning. Reviewed contraceptive methods based on pt preferences and effectiveness.  Patient had Hormonal Implant placed at the hospital .   - Discussed optimal spacing of 18 months between pregnancies   4. Sleep and fatigue -Encouraged family/partner/community support of 4 hrs of uninterrupted sleep  to help with mood and fatigue  5. Physical Recovery  - Discussed patients delivery and complications. She describes her labor as mixed. - Patient had a Vaginal, no problems at delivery. Patient had  no  laceration. Perineal healing reviewed. Patient expressed understanding - Patient has urinary incontinence? No. - Patient is safe to resume physical and sexual activity  6.  Health Maintenance - HM due items addressed Yes - Last pap smear  Diagnosis  Date Value Ref Range Status  11/19/2020   Final   - Negative for intraepithelial lesion or malignancy (NILM)   Pap smear not done at today's visit.  -Breast Cancer screening indicated? No.   7. Chronic Disease/Pregnancy Condition follow up: Hypertension - PCP follow up, recommended appt in 2-4 weeks - Patient offered and declined PT referral for neck pain at this time   Vonzella Nipple, [facility] for [employer], [facility]

## 2022-01-07 ENCOUNTER — Encounter: Payer: Self-pay | Admitting: Family Medicine

## 2022-01-12 ENCOUNTER — Ambulatory Visit (INDEPENDENT_AMBULATORY_CARE_PROVIDER_SITE_OTHER): Payer: Medicaid Other | Admitting: Family Medicine

## 2022-01-12 ENCOUNTER — Encounter: Payer: Self-pay | Admitting: Family Medicine

## 2022-01-12 DIAGNOSIS — R059 Cough, unspecified: Secondary | ICD-10-CM | POA: Diagnosis not present

## 2022-01-12 MED ORDER — CEFDINIR 300 MG PO CAPS: 300.0000 mg | ORAL_CAPSULE | Freq: Two times a day (BID) | ORAL | 0 refills | Status: DC

## 2022-01-12 NOTE — Progress Notes (Signed)
Subjective:  Patient ID: Melanie Gonzales, female    DOB: 1990/08/20  Age: 31 y.o. MRN: [id number]  CC: Chief Complaint  Patient presents with   Cough    Mainly dry, nagging cough all day , more productive in the morning x 3 weeks  Tried delsyum and robitussin     HPI:  31 year old female who is currently breast-feeding presents with cough.  Patient reports a 3-week history of cough.  Nagging.  Worse in the morning.  Productive at times.  She has tried Delsym and Robitussin as well as honey without relief.  She is currently breast-feeding.  No fever.  No other reported symptoms.  No other complaints.  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    Social Hx   Social History   Socioeconomic History   Marital status: Single    Spouse name: Not on file   Number of children: 1   Years of education: Not on file   Highest education level: Not on file  Occupational History   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    Employer: [employer]  Tobacco Use   Smoking status: Former    Types: Cigars   Smokeless tobacco: Never  Building services engineer Use: Never used  Substance and Sexual Activity   Alcohol use: Not Currently    Comment: occ   Drug use: No   Sexual activity: Not Currently    Birth control/protection: Implant  Other Topics Concern   Not on file  Social History Narrative   Not on file   Social Determinants of Health   Financial Resource Strain: Low Risk  (08/12/2021)   Overall Financial Resource Strain (CARDIA)    Difficulty of Paying Living Expenses: Not very hard   Food Insecurity: No Food Insecurity (08/12/2021)   Hunger Vital Sign    Worried About Running Out of Food in the Last Year: Never true    Ran Out of Food in the Last Year: Never true  Transportation Needs: No Transportation Needs (08/12/2021)   PRAPARE - Administrator, Civil Service (Medical): No    Lack of Transportation (Non-Medical): No  Physical Activity: Insufficiently Active (08/12/2021)   Exercise Vital Sign    Days of Exercise per Week: 2 days    Minutes of Exercise per Session: 40 min  Stress: Stress Concern Present (08/12/2021)   [employer] of Occupational Health - Occupational Stress Questionnaire    Feeling of Stress : To some extent  Social Connections: Socially Isolated (08/12/2021)   Social Connection and Isolation Panel [NHANES]    Frequency of Communication with Friends and Family: Three times a week    Frequency of Social Gatherings with Friends and Family: Twice a week    Attends Religious Services: Never    Database administrator or Organizations: No    Attends Engineer, structural: Never    Marital Status: Never married    Review of Systems Per HPI  Objective:  BP 120/80   Pulse 85   Temp 98.1 F (36.7 C)   Ht 5\' 6"  (1.676 m)  Wt 136 lb (61.7 kg)   SpO2 97%   BMI 21.95 kg/m      01/12/2022   11:32 AM 12/14/2021   10:28 AM 12/02/2021    4:09 PM  BP/Weight  Systolic BP 120 124 132  Diastolic BP 80 77 88  Wt. (Lbs) 136 136 139  BMI 21.95 kg/m2 21.95 kg/m2 22.44 kg/m2    Physical Exam Vitals and nursing note reviewed.  Constitutional:      General: She is not in acute distress.    Appearance: Normal appearance.  HENT:     Head: Normocephalic and atraumatic.  Eyes:     General:        Right eye: No discharge.        Left eye: No discharge.     Conjunctiva/sclera: Conjunctivae normal.  Cardiovascular:     Rate and Rhythm: Normal rate and regular rhythm.  Pulmonary:     Effort: Pulmonary effort is normal.      Breath sounds: Normal breath sounds. No wheezing, rhonchi or rales.  Neurological:     Mental Status: She is alert.     Lab Results  Component Value Date   WBC 5.3 11/25/2021   HGB 13.5 11/25/2021   HCT 40.0 11/25/2021   PLT 415 11/25/2021   GLUCOSE 86 11/25/2021   CHOL 225 (H) 11/19/2020   TRIG 67 11/19/2020   HDL 74 11/19/2020   LDLCALC 139 (H) 11/19/2020   ALT 25 11/25/2021   AST 21 11/25/2021   NA 143 11/25/2021   K 3.9 11/25/2021   CL 104 11/25/2021   CREATININE 0.52 (L) 11/25/2021   BUN 12 11/25/2021   CO2 22 11/25/2021   TSH 1.400 11/19/2020     Assessment & Plan:   Problem List Items Addressed This Visit       Other   Cough    Given duration of cough, I am placing the patient on Omnicef to cover for bronchitis.  She can continue over-the-counter treatment for her cough as well.  She is currently breast-feeding.       Meds ordered this encounter  Medications   cefdinir (OMNICEF) 300 MG capsule    Sig: Take 1 capsule (300 mg total) by mouth 2 (two) times daily.    Dispense:  14 capsule    Refill:  0   Naisha Wisdom DO [facility]

## 2022-01-12 NOTE — Patient Instructions (Signed)
Antibiotic as prescribed.  If this continues to persist, please let us know.  Take care  Dr. Adriana Simas

## 2022-01-12 NOTE — Assessment & Plan Note (Signed)
Given duration of cough, I am placing the patient on Omnicef to cover for bronchitis.  She can continue over-the-counter treatment for her cough as well.  She is currently breast-feeding.

## 2022-01-25 ENCOUNTER — Ambulatory Visit (HOSPITAL_COMMUNITY)
Admission: RE | Admit: 2022-01-25 | Discharge: 2022-01-25 | Disposition: A | Discharge status: Home / Self Care | Payer: Medicaid Other | Source: Ambulatory Visit | Attending: Family Medicine | Admitting: Family Medicine

## 2022-01-25 ENCOUNTER — Ambulatory Visit (INDEPENDENT_AMBULATORY_CARE_PROVIDER_SITE_OTHER): Payer: Medicaid Other | Admitting: Family Medicine

## 2022-01-25 VITALS — BP 128/72 | HR 98 | Temp 98.5°F | Ht 66.0 in | Wt 138.8 lb

## 2022-01-25 DIAGNOSIS — R053 Chronic cough: Secondary | ICD-10-CM | POA: Diagnosis not present

## 2022-01-25 DIAGNOSIS — R059 Cough, unspecified: Secondary | ICD-10-CM | POA: Diagnosis not present

## 2022-01-25 MED ORDER — PREDNISONE 10 MG (21) PO TBPK: ORAL_TABLET | ORAL | 0 refills | Status: DC

## 2022-01-25 NOTE — Progress Notes (Addendum)
Subjective:  Patient ID: Melanie Gonzales, female    DOB: August 03, 1990  Age: 31 y.o. MRN: [id number]  CC: Chief Complaint  Patient presents with   memdication follow up    return to work     Post partum form completion to return to work     HPI:  31 year old female presents with persistent cough.  Patient's cough has been going on for greater than a month.  Patient was recently seen on 8/16.  Placed on Omnicef.  Patient states that cough is not improved.  Worse in the morning and worse at night.  She is not currently breast-feeding.  No other reported symptoms.  Additionally, patient needs a note so that she may return to work from having her baby.  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    Social Hx   Social History   Socioeconomic History   Marital status: Single    Spouse name: Not on file   Number of children: 1   Years of education: Not on file   Highest education level: Not on file  Occupational History   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    Employer: [employer]  Tobacco Use   Smoking status: Former    Types: Cigars   Smokeless tobacco: Never  Building services engineer Use: Never used  Substance and Sexual Activity   Alcohol use: Not Currently    Comment: occ   Drug use: No   Sexual activity: Not Currently    Birth control/protection: Implant  Other Topics Concern   Not on file  Social History Narrative   Not on file   Social Determinants of Health   Financial Resource Strain: Low Risk  (08/12/2021)   Overall Financial Resource Strain (CARDIA)    Difficulty of Paying  Living Expenses: Not very hard  Food Insecurity: No Food Insecurity (08/12/2021)   Hunger Vital Sign    Worried About Running Out of Food in the Last Year: Never true    Ran Out of Food in the Last Year: Never true  Transportation Needs: No Transportation Needs (08/12/2021)   PRAPARE - Administrator, Civil Service (Medical): No    Lack of Transportation (Non-Medical): No  Physical Activity: Insufficiently Active (08/12/2021)   Exercise Vital Sign    Days of Exercise per Week: 2 days    Minutes of Exercise per Session: 40 min  Stress: Stress Concern Present (08/12/2021)   [employer] of Occupational Health - Occupational Stress Questionnaire    Feeling of Stress : To some extent  Social Connections: Socially Isolated (08/12/2021)   Social Connection and Isolation Panel [NHANES]    Frequency of Communication with Friends and Family: Three times a week    Frequency of Social Gatherings with Friends and Family: Twice a week    Attends Religious Services: Never    Database administrator or Organizations: No    Attends Banker Meetings: Never    Marital Status: Never married    Review of Systems Per HPI  Objective:  BP 128/72   Pulse 98   Temp 98.5 F (36.9  C)   Ht 5\' 6"  (1.676 m)   Wt 138 lb 12.8 oz (63 kg)   SpO2 98%   BMI 22.40 kg/m      01/25/2022   10:00 AM 01/12/2022   11:32 AM 12/14/2021   10:28 AM  BP/Weight  Systolic BP 128 120 124  Diastolic BP 72 80 77  Wt. (Lbs) 138.8 136 136  BMI 22.4 kg/m2 21.95 kg/m2 21.95 kg/m2    Physical Exam Vitals and nursing note reviewed.  Constitutional:      General: She is not in acute distress.    Appearance: Normal appearance.  HENT:     Head: Normocephalic and atraumatic.  Cardiovascular:     Rate and Rhythm: Normal rate and regular rhythm.  Pulmonary:     Effort: Pulmonary effort is normal.     Breath sounds: Normal breath sounds. No wheezing, rhonchi or rales.  Neurological:     Mental  Status: She is alert.  Psychiatric:        Mood and Affect: Mood normal.        Behavior: Behavior normal.     Lab Results  Component Value Date   WBC 5.3 11/25/2021   HGB 13.5 11/25/2021   HCT 40.0 11/25/2021   PLT 415 11/25/2021   GLUCOSE 86 11/25/2021   CHOL 225 (H) 11/19/2020   TRIG 67 11/19/2020   HDL 74 11/19/2020   LDLCALC 139 (H) 11/19/2020   ALT 25 11/25/2021   AST 21 11/25/2021   NA 143 11/25/2021   K 3.9 11/25/2021   CL 104 11/25/2021   CREATININE 0.52 (L) 11/25/2021   BUN 12 11/25/2021   CO2 22 11/25/2021   TSH 1.400 11/19/2020     Assessment & Plan:   Problem List Items Addressed This Visit       Other   Chronic cough - Primary    Patient now with chronic cough given the duration of cough.  Chest x-ray was obtained and was independently reviewed by me.  Interpretation: Normal chest x-ray.  No evidence of infiltrate or effusion. We will place on brief course of corticosteroids to see if we can help alleviate the cough.  If continues to persist will need to see pulmonology.      Relevant Orders   DG Chest 2 View (Completed)   Meds ordered this encounter  Medications   predniSONE (STERAPRED UNI-PAK 21 TAB) 10 MG (21) TBPK tablet    Sig: 6 tablets on day 1; decrease by 1 tablet daily until gone.    Dispense:  21 tablet    Refill:  0   Kaliel Bolds DO [facility]

## 2022-01-25 NOTE — Addendum Note (Signed)
Addended by: Tommie Sams on: 01/25/2022 10:31 PM   Modules accepted: Orders

## 2022-01-25 NOTE — Assessment & Plan Note (Signed)
Patient now with chronic cough given the duration of cough.  Chest x-ray was obtained and was independently reviewed by me.  Interpretation: Normal chest x-ray.  No evidence of infiltrate or effusion. We will place on brief course of corticosteroids to see if we can help alleviate the cough.  If continues to persist will need to see pulmonology.

## 2022-01-25 NOTE — Patient Instructions (Signed)
Chest xray today.  We will call with results.  Take care  Dr. Kia Varnadore  

## 2022-02-16 ENCOUNTER — Encounter: Payer: Self-pay | Admitting: Family Medicine

## 2022-02-18 ENCOUNTER — Other Ambulatory Visit: Payer: Self-pay | Admitting: Family Medicine

## 2022-02-18 MED ORDER — BENZONATATE 200 MG PO CAPS: 200.0000 mg | ORAL_CAPSULE | Freq: Three times a day (TID) | ORAL | 0 refills | Status: DC | PRN

## 2022-02-24 NOTE — Progress Notes (Signed)
HIGH-RISK PREGNANCY VISIT Patient name: Melanie Gonzales MRN [id number]  Date of birth: [birth year] Chief Complaint:   Routine Prenatal Visit, High Risk Gestation, and Non-stress Test (Bumps on her labia)  History of Present Illness:   Melanie Gonzales is a 31 y.o. G2P1001 female at [redacted]w[redacted]d with an Estimated Date of Delivery: 11/16/21 being seen today for ongoing management of a high-risk pregnancy complicated by chronic hypertension currently on norvasc 2.5 mg.    Today she reports episodic headaches, mostly above the right eye, not migrainoid. Contractions: Irregular.  .  Movement: Present. denies leaking of fluid.      01/25/2022   10:09 AM 08/12/2021   10:30 AM 05/05/2021    8:37 AM 04/13/2021    4:15 PM 11/19/2020    1:32 PM  Depression screen PHQ 2/9  Decreased Interest 0 1 1 0 0  Down, Depressed, Hopeless 0 0 0 0 0  PHQ - 2 Score 0 1 1 0 0  Altered sleeping  2 0  0  Tired, decreased energy  2 2  3   Change in appetite  1 2  0  Feeling bad or failure about yourself   0 0  1  Trouble concentrating  0 0  0  Moving slowly or fidgety/restless  0 0  0  Suicidal thoughts  0 0  0  PHQ-9 Score  6 5  4         08/12/2021   10:31 AM 05/05/2021    8:37 AM 11/19/2020    1:32 PM  GAD 7 : Generalized Anxiety Score  Nervous, Anxious, on Edge 0 1 1  Control/stop worrying 0 1 0  Worry too much - different things 1 1 1   Trouble relaxing 1 0 0  Restless 0 0 0  Easily annoyed or irritable 1 1 0  Afraid - awful might happen 0 0 0  Total GAD 7 Score 3 4 2      Review of Systems:   Pertinent items are noted in HPI Denies abnormal vaginal discharge w/ itching/odor/irritation, headaches, visual changes, shortness of breath, chest pain, abdominal pain, severe nausea/vomiting, or problems with urination or bowel movements unless otherwise stated above. Pertinent History Reviewed:  Reviewed past medical,surgical, social, obstetrical and family history.  Reviewed problem list, medications and  allergies. Physical Assessment:   Vitals:   10/08/21 1013  BP: 115/68  Pulse: 83  Weight: 148 lb (67.1 kg)  Body mass index is 23.89 kg/m.           Physical Examination:   General appearance: alert, well appearing, and in no distress  Mental status: alert, oriented to person, place, and time  Skin: warm & dry   Extremities: Edema: None    Cardiovascular: normal heart rate noted  Respiratory: normal respiratory effort, no distress  Abdomen: gravid, soft, non-tender  Pelvic: Cervical exam deferred         Fetal Status:     Movement: Present    Fetal Surveillance Testing today: BPP 8/8   Chaperone: N/A    No results found for this or any previous visit (from the past 24 hour(s)).   Assessment & Plan:  High-risk pregnancy: G2P1001 at [redacted]w[redacted]d with an Estimated Date of Delivery: 11/16/21      ICD-10-CM   1. Supervision of high risk pregnancy in third trimester  O09.93         Meds: No orders of the defined types were placed in this encounter.   Orders:  No orders  of the defined types were placed in this encounter.    Labs/procedures today: none  Treatment Plan:  twice weekly surveillance, IOL 39 weeks  Reviewed: Preterm labor symptoms and general obstetric precautions including but not limited to vaginal bleeding, contractions, leaking of fluid and fetal movement were reviewed in detail with the patient.  All questions were answered. Does have home bp cuff. Office bp cuff given: not applicable. Check bp daily, let us know if consistently >150 and/or >95.  Follow-up: No follow-ups on file.   No future appointments.   No orders of the defined types were placed in this encounter.  Florian Buff  Attending Physician for the [facility] 02/24/2022 12:22 AM

## 2022-03-20 ENCOUNTER — Encounter: Payer: Self-pay | Admitting: Family Medicine

## 2022-03-21 ENCOUNTER — Other Ambulatory Visit: Payer: Self-pay | Admitting: Family Medicine

## 2022-03-21 DIAGNOSIS — R053 Chronic cough: Secondary | ICD-10-CM

## 2022-03-21 MED ORDER — MELOXICAM 15 MG PO TABS: 15.0000 mg | ORAL_TABLET | Freq: Every day | ORAL | 0 refills | Status: DC | PRN

## 2022-03-21 NOTE — Telephone Encounter (Signed)
Coral Spikes, DO     I sent the medication in.   Take care   Dr. Lacinda Axon

## 2022-03-26 ENCOUNTER — Other Ambulatory Visit: Payer: Self-pay | Admitting: Adult Health

## 2022-05-06 ENCOUNTER — Ambulatory Visit (INDEPENDENT_AMBULATORY_CARE_PROVIDER_SITE_OTHER): Payer: Medicaid Other | Admitting: Internal Medicine

## 2022-05-06 ENCOUNTER — Encounter: Payer: Self-pay | Admitting: Internal Medicine

## 2022-05-06 VITALS — BP 130/80 | HR 74 | Temp 98.2°F | Ht 66.0 in | Wt 142.8 lb

## 2022-05-06 DIAGNOSIS — R053 Chronic cough: Secondary | ICD-10-CM | POA: Diagnosis not present

## 2022-05-06 DIAGNOSIS — I10 Essential (primary) hypertension: Secondary | ICD-10-CM | POA: Diagnosis not present

## 2022-05-06 MED ORDER — VALSARTAN 160 MG PO TABS: 160.0000 mg | ORAL_TABLET | Freq: Every day | ORAL | 11 refills | Status: DC

## 2022-05-06 MED ORDER — PREDNISONE 10 MG PO TABS: ORAL_TABLET | ORAL | 0 refills | Status: DC

## 2022-05-06 MED ORDER — TRAMADOL HCL 50 MG PO TABS: 50.0000 mg | ORAL_TABLET | ORAL | 0 refills | Status: AC | PRN

## 2022-05-06 MED ORDER — FAMOTIDINE 20 MG PO TABS: ORAL_TABLET | ORAL | 11 refills | Status: DC

## 2022-05-06 MED ORDER — PANTOPRAZOLE SODIUM 40 MG PO TBEC: 40.0000 mg | DELAYED_RELEASE_TABLET | Freq: Every day | ORAL | 2 refills | Status: DC

## 2022-05-06 NOTE — Assessment & Plan Note (Signed)
Try off acei 05/06/2022 for refractory cough   In the best review of chronic cough to date ( NEJM 2016 375 [id number]) ,  ACEi are now felt to cause cough in up to  20% of pts which is a 4 fold increase from previous reports and does not include the variety of non-specific complaints we see in pulmonary clinic in pts on ACEi but previously attributed to another dx like  Copd/asthma and  include PNDS, throat and chest congestion, "bronchitis", unexplained dyspnea and noct "strangling" sensations, and hoarseness, but also  atypical /refractory GERD symptoms like dysphagia and "bad heartburn"   The only way I know  to prove this is not an "ACEi Case" is a trial off ACEi x a minimum of 6 weeks then regroup.   >>> try valsartan 160 mg daily / will self monitor and reduce to one half if too strong and f/u in 6 weeks, call sooner if needed         Each maintenance medication was reviewed in detail including emphasizing most importantly the difference between maintenance and prns and under what circumstances the prns are to be triggered using an action plan format where appropriate.  Total time for H and P, chart review, counseling,  and generating customized AVS unique to this office visit / same day charting = 33 min

## 2022-05-06 NOTE — Assessment & Plan Note (Signed)
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   These are the same pts (now being labeled as having "irritable larynx syndrome" by some cough centers) who not infrequently have a history of having failed to tolerate ace inhibitors,  dry powder inhalers or biphosphonates or report having atypical/extraesophageal reflux symptoms that don't respond to standard doses of PPI  and are easily confused as having aecopd or asthma flares by even experienced allergists/ pulmonologists (myself included).   Of the three most common causes of  Sub-acute / recurrent or chronic cough, only one (GERD)  can actually contribute to/ trigger  the other two (asthma and post nasal drip syndrome)  and perpetuate the cylce of cough.  While not intuitively obvious, many patients with chronic low grade reflux do not cough until there is a primary insult that disturbs the protective epithelial barrier and exposes sensitive nerve endings.     >>>   The point is that once this occurs, it is difficult to eliminate the cycle  using anything but a maximally effective acid suppression regimen at least in the short run, accompanied by an appropriate diet to address non acid GERD and control / eliminate the cough itself for at least 3 days with tramadol/ control any pnds with 1st gen H1 blockers per guidelines  hs prn and >>> also so added 6 day taper off  Prednisone starting at 40 mg per day in case of component of Th-2 driven  upper or lower airways inflammation (if cough responds short term only to relapse before return while will on full rx for uacs (as above), then  that would point to allergic rhinitis/ asthma or eos bronchitis as alternative dx)

## 2022-05-06 NOTE — Progress Notes (Signed)
Melanie Gonzales, female    DOB: [birth year]    MRN: [id number]   Brief patient profile:  31   yobf stop smoking age 31 with 1st IUP complicated by HBP on lisinopril March 2023  referred to pulmonary clinic in [place]  05/06/2022 by Dr Everlene Other  for cough onset June 2023 indolent onset s obvious sinus complaints    History of Present Illness  05/06/2022  Pulmonary/ 1st office eval/ Sherene Sires / [facility]  Chief Complaint  Patient presents with   Consult    Chronic cough starting June dry nagging cough. Has been on Lisinopril for a while   Dyspnea:  not aerobic/ steps are ok  Cough: dry pattern worse at hs and keeps her up/ not nursing / assoc HB Sleep: flat bed / new head elevation seems to help x 3 pillows  SABA use: none  02: none  Tessalon no better, pred may have helped transiently   No obvious other patterns in day to day or daytime  variability or assoc excess/ purulent sputum or mucus plugs or hemoptysis or cp or chest tightness, subjective wheeze or overt sinus or hb symptoms.    Also denies any obvious fluctuation of symptoms with weather or environmental changes or other aggravating or alleviating factors except as outlined above   No unusual exposure hx or h/o childhood pna/ asthma or knowledge of premature birth.  Current Allergies, Complete Past Medical History, Past Surgical History, Family History, and Social History were reviewed in [name] record.  ROS  The following are not active complaints unless bolded Hoarseness, sore throat, dysphagia, dental problems, itching, sneezing,  nasal congestion or discharge of excess mucus or purulent secretions, ear ache,   fever, chills, sweats, unintended wt loss or wt gain, classically pleuritic or exertional cp,  orthopnea pnd or arm/hand swelling  or leg swelling, presyncope, palpitations, abdominal pain, anorexia, nausea, vomiting, diarrhea  or change in bowel habits or change in bladder habits,  change in stools or change in urine, dysuria, hematuria,  rash, arthralgias, visual complaints, headache, numbness, weakness or ataxia or problems with walking or coordination,  change in mood or  memory.             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     Blood Pressure Monitor MISC 1 each by Does not apply route once a week. 1 each 0   Docusate Sodium (COLACE PO) Take by mouth.     etonogestrel (NEXPLANON) 68 MG IMPL implant 1 each by Subdermal route once.     lisinopril (ZESTRIL) 10 MG tablet TAKE 1 TABLET(10 MG) BY MOUTH DAILY 30 tablet 3   meloxicam (MOBIC) 15 MG tablet Take 1 tablet (15 mg total) by mouth daily as needed for pain (Headache; neck pain). 30 tablet 0   benzonatate (TESSALON) 200 MG capsule Take 1 capsule (200 mg total) by mouth 3 (three) times daily as needed for cough. 30 capsule 0  Prenatal Vit-Fe Fumarate-FA (PRENATAL VITAMIN) 27-0.8 MG TABS Take 1 tablet by mouth daily. 100 tablet 11   No facility-administered medications prior to visit.     Objective:     BP 130/80   Pulse 74   Temp 98.2 F (36.8 C)   Ht 5\' 6"  (1.676 m)   Wt 142 lb 12.8 oz (64.8 kg)   SpO2 100% Comment: ra  BMI 23.05 kg/m   SpO2: 100 % (ra)  Amb bf nad    HEENT : Oropharynx  clear        NECK :  without  apparent JVD/ palpable Nodes/TM    LUNGS: no acc muscle use,  Nl contour chest which is clear to A and P bilaterally with cough on insp     CV:  RRR  no s3 or murmur or increase in P2, and no edema   ABD:   soft and nontender with nl inspiratory excursion in the supine position. No bruits or organomegaly appreciated   MS:  Nl gait/ ext warm without deformities Or obvious joint restrictions  calf tenderness, cyanosis or clubbing    SKIN: warm and dry without lesions    NEURO:  alert, approp, nl sensorium with no motor or cerebellar deficits apparent.      I personally reviewed images and agree with radiology impression as follows:  CXR:   01/25/22  No acute cardiopulmonary process.   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   These are the same pts (now being labeled as having "irritable larynx syndrome" by some cough centers) who not infrequently have a history of having failed to tolerate ace inhibitors,  dry powder inhalers or biphosphonates or report having atypical/extraesophageal reflux symptoms that don't respond to standard doses of PPI  and are easily confused as having aecopd or asthma flares by even experienced allergists/ pulmonologists (myself included).   Of the three most common causes of  Sub-acute / recurrent or chronic cough, only one (GERD)  can actually contribute to/ trigger  the other two (asthma and post nasal drip syndrome)  and perpetuate the cylce of cough.  While not intuitively obvious, many patients with chronic low grade reflux do not cough until there is a primary insult that disturbs the protective epithelial barrier and  exposes sensitive nerve endings.     >>>   The point is that once this occurs, it is difficult to eliminate the cycle  using anything but a maximally effective acid suppression regimen at least in the short run, accompanied by an appropriate diet to address non acid GERD and control / eliminate the cough itself for at least 3 days with tramadol/ control any pnds with 1st gen H1 blockers per guidelines  hs prn and >>> also so added 6 day taper off  Prednisone starting at 40 mg per day in case of component of Th-2 driven upper or lower airways inflammation (if cough  responds short term only to relapse before return while will on full rx for uacs (as above), then  that would point to allergic rhinitis/ asthma or eos bronchitis as alternative dx)     Essential hypertension Try off acei 05/06/2022 for refractory cough   In the best review of chronic cough to date ( NEJM 2016 375 [id number]) ,  ACEi are now felt to cause cough in up to  20% of pts which is a 4 fold increase from previous reports and does not include the variety of non-specific complaints we see in pulmonary clinic in pts on ACEi but previously attributed to another dx like  Copd/asthma and  include PNDS, throat and chest congestion, "bronchitis", unexplained dyspnea and noct "strangling" sensations, and hoarseness, but also  atypical /refractory GERD symptoms like dysphagia and "bad heartburn"   The only way I know  to prove this is not an "ACEi Case" is a trial off ACEi x a minimum of 6 weeks then regroup.   >>> try valsartan 160 mg daily / will self monitor and reduce to one half if too strong and f/u in 6 weeks, call sooner if needed         Each maintenance medication was reviewed in detail including emphasizing most importantly the difference between maintenance and prns and under what circumstances the prns are to be triggered using an action plan format where appropriate.  Total time for H and P, chart review, counseling,  and  generating customized AVS unique to this office visit / same day charting = 33 min           Christinia Gully, MD 05/06/2022

## 2022-05-06 NOTE — Patient Instructions (Addendum)
The key to effective treatment for your cough is eliminating the non-stop cycle of cough you're stuck in long enough to let your airway heal completely and then see if there is anything still making you cough once you stop the cough suppression, but this should take no more than 5 days to figure out  First take delsym two tsp every 12 hours and supplement if needed with  tramadol 50 mg up to 1 every 4 hours to suppress the urge to cough at all or even clear your throat. Swallowing water or using ice chips/non mint and menthol containing candies (such as lifesavers or sugarless jolly ranchers) are also effective.  You should rest your voice and avoid activities that you know make you cough.  Once you have eliminated the cough for 3 straight days try reducing the tramadol first,  then the delsym as tolerated.    Prednisone 10 mg take  4 each am x 2 days,   2 each am x 2 days,  1 each am x 2 days and stop (this is to eliminate allergies and inflammation from coughing)  Protonix (pantoprazole) Take 30-60 min before first meal of the day and Pepcid 20 mg one bedtime plus chlorpheniramine 4 mg (allergy relief blue box/ equate green box)  x 1- 2 at bedtime (  available over the counter)  until cough is completely gone for at least a week without the need for cough suppression  GERD (REFLUX)  is an extremely common cause of respiratory symptoms, many times with no significant heartburn at all.    It can be treated with medication, but also with lifestyle changes including avoidance of late meals, excessive alcohol, smoking cessation, and avoid fatty foods, chocolate, peppermint, colas, red wine, and acidic juices such as orange juice.  NO MINT OR MENTHOL PRODUCTS SO NO COUGH DROPS  USE HARD CANDY INSTEAD (jolley ranchers or Stover's or Lifesavers (all available in sugarless versions) NO OIL BASED VITAMINS - use powdered substitutes.  Stop lisinopril and start valsartan 160 mg one daily - one half daily if  too strong   Please schedule a follow up office visit in 6 weeks, call sooner if needed

## 2022-06-16 ENCOUNTER — Telehealth: Payer: Self-pay | Admitting: *Deleted

## 2022-06-16 NOTE — Telephone Encounter (Signed)
Cough should be much better by now so no more tramadol or prednisone until regroup with all meds in hand Would continue delsym 2 tsp bid prn cough in meantime as don't need rx for this

## 2022-06-16 NOTE — Telephone Encounter (Signed)
patient needed to reschedule appointment for 1/19. next available was Mid-Feb. Patient is out of prednisone and tramadol and wanted to see if she could get another prescription. Please call and advise patient

## 2022-06-16 NOTE — Telephone Encounter (Signed)
Atc  patient. No answer but left a detailed vm on cell phone (ok per dpr) with message from Dr. [name]. Advised patient on vm to call back if she needed anything further. Nothing further needed at this time.

## 2022-06-17 ENCOUNTER — Ambulatory Visit: Payer: Medicaid Other | Admitting: Internal Medicine

## 2022-07-01 ENCOUNTER — Ambulatory Visit: Payer: Self-pay | Admitting: Internal Medicine

## 2022-07-01 NOTE — Progress Notes (Incomplete)
Melanie Gonzales, female    DOB: 02/02/1991    MRN: [id number]   Brief patient profile:  32  yobf stopped smoking age 32 with 1st IUP complicated by HBP on lisinopril March 2023  referred to pulmonary clinic in [place]  31/12/2021 by Dr Thersa Salt  for cough onset June 2023 indolent onset s obvious sinus complaints    History of Present Illness  05/06/2022  Pulmonary/ 1st office eval/ [name] / [name]  Chief Complaint  Patient presents with   Consult    Chronic cough starting June dry nagging cough. Has been on Lisinopril for a while   Dyspnea:  not aerobic/ steps are ok  Cough: dry pattern worse at hs and keeps her up/ not nursing / assoc HB Sleep: flat bed / new head elevation seems to help x 3 pillows  SABA use: none  02: none  Tessalon no better, pred may have helped transiently  Rec  First take delsym two tsp every 12 hours and supplement if needed with  tramadol 50 mg up to 1 every 4 hours to suppress the urge to cough at all or even clear your throat.  Prednisone 10 mg take  4 each am x 2 days,   2 each am x 2 days,  1 each am x 2 days and stop (this is to eliminate allergies and inflammation from coughing) Protonix (pantoprazole) Take 30-60 min before first meal of the day and Pepcid 20 mg one bedtime plus chlorpheniramine 4 mg (allergy relief blue box/ equate green box)  x 1- 2 at bedtime (  available over the counter)  until cough is completely gone for at least a week without the need for cough suppression GERD diet reviewed, bed blocks rec  Stop lisinopril and start valsartan 160 mg one daily - one half daily if too strong      07/01/2022  f/u ov/[place] office/Anterio Scheel re: *** maint on ***  No chief complaint on file.   Dyspnea:  *** Cough: *** Sleeping: *** SABA use: *** 02: *** Covid status: *** Lung cancer screening: ***   No obvious day to day or daytime variability or assoc excess/ purulent sputum or mucus plugs or hemoptysis or cp or chest  tightness, subjective wheeze or overt sinus or hb symptoms.   *** without nocturnal  or early am exacerbation  of respiratory  c/o's or need for noct saba. Also denies any obvious fluctuation of symptoms with weather or environmental changes or other aggravating or alleviating factors except as outlined above   No unusual exposure hx or h/o childhood pna/ asthma or knowledge of premature birth.  Current Allergies, Complete Past Medical History, Past Surgical History, Family History, and Social History were reviewed in [employer] record.  ROS  The following are not active complaints unless bolded Hoarseness, sore throat, dysphagia, dental problems, itching, sneezing,  nasal congestion or discharge of excess mucus or purulent secretions, ear ache,   fever, chills, sweats, unintended wt loss or wt gain, classically pleuritic or exertional cp,  orthopnea pnd or arm/hand swelling  or leg swelling, presyncope, palpitations, abdominal pain, anorexia, nausea, vomiting, diarrhea  or change in bowel habits or change in bladder habits, change in stools or change in urine, dysuria, hematuria,  rash, arthralgias, visual complaints, headache, numbness, weakness or ataxia or problems with walking or coordination,  change in mood or  memory.        No outpatient medications have been marked as taking for  the 07/01/22 encounter (Appointment) with Tanda Rockers, MD.             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

## 2022-07-20 ENCOUNTER — Encounter: Payer: Self-pay | Admitting: Family Medicine

## 2022-07-26 ENCOUNTER — Ambulatory Visit (INDEPENDENT_AMBULATORY_CARE_PROVIDER_SITE_OTHER): Payer: Medicaid Other | Admitting: Internal Medicine

## 2022-07-26 ENCOUNTER — Encounter: Payer: Self-pay | Admitting: Internal Medicine

## 2022-07-26 VITALS — BP 128/74 | HR 82 | Ht 66.0 in | Wt 140.6 lb

## 2022-07-26 DIAGNOSIS — I10 Essential (primary) hypertension: Secondary | ICD-10-CM | POA: Diagnosis not present

## 2022-07-26 DIAGNOSIS — R053 Chronic cough: Secondary | ICD-10-CM

## 2022-07-26 MED ORDER — PREDNISONE 10 MG PO TABS: ORAL_TABLET | ORAL | 0 refills | Status: DC

## 2022-07-26 NOTE — Assessment & Plan Note (Signed)
Try off acei 05/06/2022 for refractory cough > marked improvement until "allergy season"  Although even in retrospect it may not be clear the ACEi contributed to the pt's symptoms,  Pt improved off them and adding them back at this point or in the future would risk confusion in interpretation of non-specific respiratory symptoms to which this patient is prone  ie  Better not to muddy the waters here.   >>> diovan 160 mg daily and f/u with pcp  F/u here prn         Each maintenance medication was reviewed in detail including emphasizing most importantly the difference between maintenance and prns and under what circumstances the prns are to be triggered using an action plan format where appropriate.  Total time for H and P, chart review, counseling,   and generating customized AVS unique to this office visit / same day charting = 23 min

## 2022-07-26 NOTE — Assessment & Plan Note (Signed)
Onset march 2023 - 05/06/2022 try off acei and on gerd rx  - add 1st gen H1 blockers per guidelines  for possible allergic rhiinitis  07/26/2022 >>>   Upper airway cough syndrome (previously labeled PNDS),  is so named because it's frequently impossible to sort out how much is  CR/sinusitis with freq throat clearing (which can be related to primary GERD)   vs  causing  secondary (" extra esophageal")  GERD from wide swings in gastric pressure that occur with throat clearing, often  promoting self use of mint and menthol lozenges that reduce the lower esophageal sphincter tone and exacerbate the problem further in a cyclical fashion.   These are the same pts (now being labeled as having "irritable larynx syndrome" by some cough centers) who not infrequently have a history of having failed to tolerate ace inhibitors,  dry powder inhalers or biphosphonates or report having atypical/extraesophageal reflux symptoms that don't respond to standard doses of PPI  and are easily confused as having aecopd or asthma flares by even experienced allergists/ pulmonologists (myself included).   This is either rhnitis with pnds/allergic mech or gerd/LPR or both (worse when changed to prn ppi/h2)  Rec Gerd rx whenever cough flares for any reason Add 1st gen H1 blockers per guidelines   If not better:  Prednisone 10 mg take  4 each am x 2 days,   2 each am x 2 days,  1 each am x 2 days and stop and allergy referral  Pulmonry f/u prn

## 2022-07-26 NOTE — Progress Notes (Signed)
Melanie Gonzales, female    DOB: Jan 30, 1991    MRN: [id number]   Brief patient profile:  32  yobf stopped smoking age 32 with 1st IUP complicated by HBP on lisinopril March 2023  referred to pulmonary clinic in [place]  05/06/2022 by Dr Thersa Salt  for cough onset June 2023 indolent onset s obvious sinus complaints    History of Present Illness  05/06/2022  Pulmonary/ 1st office eval/ Melanie Gonzales / [facility]  Chief Complaint  Patient presents with   Consult    Chronic cough starting June dry nagging cough. Has been on Lisinopril for a while   Dyspnea:  not aerobic/ steps are ok  Cough: dry pattern worse at hs and keeps her up/ not nursing / assoc HB Sleep: flat bed / new head elevation seems to help x 3 pillows  SABA use: none  02: none  Tessalon no better, pred may have helped transiently  Rec  First take delsym two tsp every 12 hours and supplement if needed with  tramadol 50 mg up to 1 every 4 hours to suppress the urge to cough at all or even clear your throat.  Prednisone 10 mg take  4 each am x 2 days,   2 each am x 2 days,  1 each am x 2 days and stop (this is to eliminate allergies and inflammation from coughing) Protonix (pantoprazole) Take 30-60 min before first meal of the day and Pepcid 20 mg one bedtime plus chlorpheniramine 4 mg (allergy relief blue box/ equate green box)  x 1- 2 at bedtime (  available over the counter)  until cough is completely gone for at least a week without the need for cough suppression GERD diet reviewed, bed blocks rec  Stop lisinopril and start valsartan 160 mg one daily - one half daily if too strong      07/26/2022  f/u ov/[place] office/Melanie Gonzales re: uacs maint / worse x early Feb 2024  Chief Complaint  Patient presents with   Follow-up    Allergies  Cough was better until allergies started  Dyspnea:  Not limited by breathing from desired activities   Cough: worse at hs x  one hours / dry cough assoc with runny, itching, sneezing   Sleeping: not typically waking her up  SABA use: none  02: none  Covid status: vax x 3      No obvious day to day or daytime variability or assoc excess/ purulent sputum or mucus plugs or hemoptysis or cp or chest tightness, subjective wheeze or overt sinus or hb symptoms.     Also denies any obvious fluctuation of symptoms with weather or environmental changes or other aggravating or alleviating factors except as outlined above   No unusual exposure hx or h/o childhood pna/ asthma or knowledge of premature birth.  Current Allergies, Complete Past Medical History, Past Surgical History, Family History, and Social History were reviewed in [employer] record.  ROS  The following are not active complaints unless bolded Hoarseness, sore throat, dysphagia, dental problems, itching, sneezing,  nasal congestion or discharge of excess mucus or purulent secretions, ear ache,   fever, chills, sweats, unintended wt loss or wt gain, classically pleuritic or exertional cp,  orthopnea pnd or arm/hand swelling  or leg swelling, presyncope, palpitations, abdominal pain, anorexia, nausea, vomiting, diarrhea  or change in bowel habits or change in bladder habits, change in stools or change in urine, dysuria, hematuria,  rash, arthralgias, visual complaints, headache,  numbness, weakness or ataxia or problems with walking or coordination,  change in mood or  memory.        Current Meds  Medication Sig   acetaminophen (TYLENOL) 325 MG tablet Take 2 tablets (650 mg total) by mouth every 4 (four) hours as needed (for pain scale < 4).   Blood Pressure Monitor MISC 1 each by Does not apply route once a week.   Docusate Sodium (COLACE PO) Take by mouth.   etonogestrel (NEXPLANON) 68 MG IMPL implant 1 each by Subdermal route once.   famotidine (PEPCID) 20 MG tablet One after supper   meloxicam (MOBIC) 15 MG tablet Take 1 tablet (15 mg total) by mouth daily as needed for pain (Headache; neck  pain).   pantoprazole (PROTONIX) 40 MG tablet Take 1 tablet (40 mg total) by mouth daily. Take 30-60 min before first meal of the day   valsartan (DIOVAN) 160 MG tablet Take 1 tablet (160 mg total) by mouth daily.             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   Hx of migraines 08/13/2012   Hypertension    Migraines    Nipple discharge in female 07/15/2014   Vaginal Pap smear, abnormal        Objective:     Wt Readings from Last 3 Encounters:  07/26/22 140 lb 9.6 oz (63.8 kg)  05/06/22 142 lb 12.8 oz (64.8 kg)  01/25/22 138 lb 12.8 oz (63 kg)      Vital signs reviewed  07/26/2022  - Note at rest 02 sats  98% on RA   General appearance:    amb bf nad   HEENT : Oropharynx  clear      Nasal turbinates nl    NECK :  without  apparent JVD/ palpable Nodes/TM    LUNGS: no acc muscle use,  Nl contour chest which is clear to A and P bilaterally without cough on insp or exp maneuvers   CV:  RRR  no s3 or murmur or increase in P2, and no edema   ABD:  soft and nontender with nl inspiratory excursion in the supine position. No bruits or organomegaly appreciated   MS:  Nl gait/ ext warm without deformities Or obvious joint restrictions  calf tenderness, cyanosis or clubbing    SKIN: warm and dry without lesions    NEURO:  alert, approp, nl sensorium with  no motor or cerebellar deficits apparent.              Assessment

## 2022-07-26 NOTE — Patient Instructions (Signed)
When ever you have any resp problems:  Pantoprazole (protonix) 40 mg   Take  30-60 min before first meal of the day and Pepcid (famotidine)  20 mg after supper until return to office - this is the best way to tell whether stomach acid is contributing to your problem.    Add For drainage / throat tickle try take CHLORPHENIRAMINE  4 mg  ("Allergy Relief" '4mg'$   at [facility] should be easiest to find in the blue box usually on bottom shelf)  take one every 4 hours as needed - extremely effective and inexpensive over the counter- may cause drowsiness so start with just a dose or two an hour before bedtime and see how you tolerate it before trying in daytime.   Use zyrtec in the morning   Let me know if you want to be referred to allergy - if all else fails :  Prednisone 10 mg take  4 each am x 2 days,   2 each am x 2 days,  1 each am x 2 days and stop   Follow up here is as needed

## 2022-08-07 ENCOUNTER — Other Ambulatory Visit: Payer: Self-pay | Admitting: Internal Medicine

## 2022-08-11 ENCOUNTER — Ambulatory Visit (INDEPENDENT_AMBULATORY_CARE_PROVIDER_SITE_OTHER): Payer: Medicaid Other | Admitting: Family Medicine

## 2022-08-11 VITALS — BP 120/81 | Temp 97.6°F | Ht 66.0 in | Wt 141.0 lb

## 2022-08-11 DIAGNOSIS — F419 Anxiety disorder, unspecified: Secondary | ICD-10-CM

## 2022-08-11 MED ORDER — SERTRALINE HCL 50 MG PO TABS: 50.0000 mg | ORAL_TABLET | Freq: Every day | ORAL | 1 refills | Status: DC

## 2022-08-11 NOTE — Progress Notes (Signed)
Subjective:  Patient ID: Melanie Gonzales, female    DOB: Feb 02, 1991  Age: 32 y.o. MRN: [id number]  CC: Chief Complaint  Patient presents with   Anxiety    Easily anxious and irritability    HPI:  32 year old female presents for evaluation of the above.  Patient states that she suffers from anxiety.  It has been worsening over the past 8 months.  She reports she is very anxious and is easily irritated.  She attributes the increase in her anxiety due to her living situation.  She is not happy where she is living.  She lives in an apartment with her 2 children.  She was previously on BuSpar.  She is interested in taking a daily medication to help with her anxiety.  Patient states that she is struggling at this time and does not feel like she is managing well.  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    Social Hx   Social History   Socioeconomic History   Marital status: Single    Spouse name: Not on file   Number of children: 1   Years of education: Not on file   Highest education level: Not on file  Occupational History   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    Employer: [employer]  Tobacco Use   Smoking status: Former    Types: Cigars   Smokeless tobacco: Never  Scientific laboratory technician Use: Never used  Substance and Sexual Activity   Alcohol use: Not Currently    Comment: occ   Drug use: No   Sexual activity: Not Currently    Birth control/protection: Implant  Other Topics Concern   Not on file  Social History Narrative   Not on file   Social Determinants of Health   Financial  Resource Strain: Low Risk  (08/12/2021)   Overall Financial Resource Strain (CARDIA)    Difficulty of Paying Living Expenses: Not very hard  Food Insecurity: No Food Insecurity (08/12/2021)   Hunger Vital Sign    Worried About Running Out of Food in the Last Year: Never true    Ran Out of Food in the Last Year: Never true  Transportation Needs: No Transportation Needs (08/12/2021)   PRAPARE - Hydrologist (Medical): No    Lack of Transportation (Non-Medical): No  Physical Activity: Insufficiently Active (08/12/2021)   Exercise Vital Sign    Days of Exercise per Week: 2 days    Minutes of Exercise per Session: 40 min  Stress: Stress Concern Present (08/12/2021)   [place]    Feeling of Stress : To some extent  Social Connections: Socially Isolated (08/12/2021)   Social Connection and Isolation Panel [NHANES]    Frequency of Communication with Friends and Family: Three times a week    Frequency of Social Gatherings with Friends and Family: Twice a week    Attends Religious Services: Never    Marine scientist or Organizations: No    Attends Archivist Meetings: Never    Marital Status: Never married  Review of Systems Per HPI  Objective:  BP 120/81   Temp 97.6 F (36.4 C)   Ht '5\' 6"'$  (1.676 m)   Wt 141 lb (64 kg)   SpO2 99%   BMI 22.76 kg/m      08/11/2022    8:52 AM 07/26/2022    8:32 AM 05/06/2022    9:30 AM  BP/Weight  Systolic BP [id number]  Diastolic BP 81 74 80  Wt. (Lbs) 141 140.6 142.8  BMI 22.76 kg/m2 22.69 kg/m2 23.05 kg/m2    Physical Exam Vitals and nursing note reviewed.  Constitutional:      General: She is not in acute distress.    Appearance: Normal appearance.  HENT:     Head: Normocephalic and atraumatic.  Cardiovascular:     Rate and Rhythm: Normal rate and regular rhythm.  Pulmonary:     Effort: Pulmonary effort is normal.     Breath  sounds: Normal breath sounds. No wheezing, rhonchi or rales.  Neurological:     Mental Status: She is alert.  Psychiatric:        Behavior: Behavior normal.     Comments: Tearful.     Lab Results  Component Value Date   WBC 5.3 11/25/2021   HGB 13.5 11/25/2021   HCT 40.0 11/25/2021   PLT 415 11/25/2021   GLUCOSE 86 11/25/2021   CHOL 225 (H) 11/19/2020   TRIG 67 11/19/2020   HDL 74 11/19/2020   LDLCALC 139 (H) 11/19/2020   ALT 25 11/25/2021   AST 21 11/25/2021   NA 143 11/25/2021   K 3.9 11/25/2021   CL 104 11/25/2021   CREATININE 0.52 (L) 11/25/2021   BUN 12 11/25/2021   CO2 22 11/25/2021   TSH 1.400 11/19/2020     Assessment & Plan:   Problem List Items Addressed This Visit       Other   Anxiety - Primary    Worsening.  Exacerbation.  Starting on Zoloft.  Follow-up in 6 weeks.      Relevant Medications   sertraline (ZOLOFT) 50 MG tablet    Meds ordered this encounter  Medications   sertraline (ZOLOFT) 50 MG tablet    Sig: Take 1 tablet (50 mg total) by mouth daily.    Dispense:  90 tablet    Refill:  1    Follow-up:  Return in about 6 weeks (around 09/22/2022).  Elida

## 2022-08-11 NOTE — Assessment & Plan Note (Signed)
Worsening.  Exacerbation.  Starting on Zoloft.  Follow-up in 6 weeks.

## 2022-08-11 NOTE — Patient Instructions (Signed)
Medication as prescribed.  Follow up in 6 weeks.  Take care  Dr. Lacinda Axon

## 2022-09-22 ENCOUNTER — Ambulatory Visit (INDEPENDENT_AMBULATORY_CARE_PROVIDER_SITE_OTHER): Payer: Medicaid Other | Admitting: Family Medicine

## 2022-09-22 ENCOUNTER — Encounter: Payer: Self-pay | Admitting: Family Medicine

## 2022-09-22 DIAGNOSIS — F419 Anxiety disorder, unspecified: Secondary | ICD-10-CM

## 2022-09-22 DIAGNOSIS — I10 Essential (primary) hypertension: Secondary | ICD-10-CM | POA: Diagnosis not present

## 2022-09-22 MED ORDER — SERTRALINE HCL 100 MG PO TABS: 100.0000 mg | ORAL_TABLET | Freq: Every day | ORAL | 1 refills | Status: DC

## 2022-09-22 NOTE — Patient Instructions (Signed)
I increased the medication.  Follow up in 3 months.  Take care  Dr. Adriana Simas

## 2022-09-22 NOTE — Assessment & Plan Note (Signed)
Remains problematic and uncontrolled.  Has had no significant response to Zoloft at current dosing.  No adverse side effects.  Increasing Zoloft.

## 2022-09-22 NOTE — Assessment & Plan Note (Signed)
Stable.  Continue valsartan. 

## 2022-09-22 NOTE — Progress Notes (Signed)
Subjective:  Patient ID: Melanie Gonzales, female    DOB: Jan 17, 1991  Age: 32 y.o. MRN: [id number]  CC: Chief Complaint  Patient presents with   Anxiety    Request possible increase    irregular cycle    Lasted 3 weeks this month on nexplanon for 1 yr    HPI:  32 year old female presents for follow-up.  Blood pressure is well-controlled on valsartan.  Patient continues to struggle with anxiety.  She has not seen significant improvement with Zoloft 50 mg daily.  She currently has a lot on her plate.  She is going to school to get her bachelor's degree, she is working full-time, she has 2 children, and she is unhappy with her living situation.  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    Social Hx   Social History   Socioeconomic History   Marital status: Single    Spouse name: Not on file   Number of children: 1   Years of education: Not on file   Highest education level: Associate degree: academic program  Occupational History   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    Employer: [employer]  Tobacco Use   Smoking status: Former    Types: Cigars   Smokeless tobacco: Never  Building services engineer Use: Never used  Substance and Sexual Activity   Alcohol use: Not Currently    Comment: occ   Drug use: No   Sexual activity: Not Currently    Birth control/protection: Implant  Other Topics Concern   Not on file  Social History Narrative   Not on file   Social Determinants of Health   Financial Resource Strain: Low Risk  (09/21/2022)   Overall Financial Resource Strain (CARDIA)    Difficulty of Paying Living  Expenses: Not very hard  Food Insecurity: No Food Insecurity (09/21/2022)   Hunger Vital Sign    Worried About Running Out of Food in the Last Year: Never true    Ran Out of Food in the Last Year: Never true  Transportation Needs: No Transportation Needs (09/21/2022)   PRAPARE - Administrator, Civil Service (Medical): No    Lack of Transportation (Non-Medical): No  Physical Activity: Insufficiently Active (09/21/2022)   Exercise Vital Sign    Days of Exercise per Week: 1 day    Minutes of Exercise per Session: 20 min  Stress: Stress Concern Present (09/21/2022)   [employer] of Occupational Health - Occupational Stress Questionnaire    Feeling of Stress : Very much  Social Connections: Moderately Integrated (09/21/2022)   Social Connection and Isolation Panel [NHANES]    Frequency of Communication with Friends and Family: More than three times a week    Frequency of Social Gatherings with Friends and Family: Once a week    Attends Religious Services: 1 to 4 times per year    Active Member of [employer] or Organizations: No    Attends Engineer, structural: Not on file    Marital Status: Living with partner    Review of Systems Per HPI  Objective:  BP 116/62   Pulse 82   Temp 98.1 F (  36.7 C)   Ht  (1.676 m)   Wt 139 lb (63 kg)   LMP 08/31/2022 (Approximate)   SpO2 97%   BMI 22.44 kg/m      09/22/2022    1:41 PM 08/11/2022    8:52 AM 07/26/2022    8:32 AM  BP/Weight  Systolic BP 116 120 128  Diastolic BP 62 81 74  Wt. (Lbs) 139 141 140.6  BMI 22.44 kg/m2 22.76 kg/m2 22.69 kg/m2    Physical Exam Vitals and nursing note reviewed.  Constitutional:      General: She is not in acute distress.    Appearance: Normal appearance.  HENT:     Head: Normocephalic and atraumatic.  Cardiovascular:     Rate and Rhythm: Normal rate and regular rhythm.  Pulmonary:     Effort: Pulmonary effort is normal.     Breath sounds: Normal breath sounds. No  wheezing, rhonchi or rales.  Neurological:     Mental Status: She is alert.  Psychiatric:        Mood and Affect: Mood normal.        Behavior: Behavior normal.     Lab Results  Component Value Date   WBC 5.3 11/25/2021   HGB 13.5 11/25/2021   HCT 40.0 11/25/2021   PLT 415 11/25/2021   GLUCOSE 86 11/25/2021   CHOL 225 (H) 11/19/2020   TRIG 67 11/19/2020   HDL 74 11/19/2020   LDLCALC 139 (H) 11/19/2020   ALT 25 11/25/2021   AST 21 11/25/2021   NA 143 11/25/2021   K 3.9 11/25/2021   CL 104 11/25/2021   CREATININE 0.52 (L) 11/25/2021   BUN 12 11/25/2021   CO2 22 11/25/2021   TSH 1.400 11/19/2020     Assessment & Plan:   Problem List Items Addressed This Visit       Cardiovascular and Mediastinum   Essential hypertension    Stable.  Continue valsartan.        Other   Anxiety    Remains problematic and uncontrolled.  Has had no significant response to Zoloft at current dosing.  No adverse side effects.  Increasing Zoloft.      Relevant Medications   sertraline (ZOLOFT) 100 MG tablet    Meds ordered this encounter  Medications   sertraline (ZOLOFT) 100 MG tablet    Sig: Take 1 tablet (100 mg total) by mouth daily.    Dispense:  90 tablet    Refill:  1    Follow-up:  Return in about 3 months (around 12/22/2022).  Everlene Other DO [facility]

## 2022-10-06 DIAGNOSIS — Z6822 Body mass index (BMI) 22.0-22.9, adult: Secondary | ICD-10-CM | POA: Diagnosis not present

## 2022-10-06 DIAGNOSIS — J069 Acute upper respiratory infection, unspecified: Secondary | ICD-10-CM | POA: Diagnosis not present

## 2022-12-22 ENCOUNTER — Ambulatory Visit: Payer: Medicaid Other | Admitting: Family Medicine

## 2022-12-23 ENCOUNTER — Encounter: Payer: Self-pay | Admitting: Family Medicine

## 2022-12-29 ENCOUNTER — Other Ambulatory Visit: Payer: Self-pay | Admitting: Adult Health

## 2022-12-29 MED ORDER — FLUCONAZOLE 150 MG PO TABS: ORAL_TABLET | ORAL | 1 refills | Status: DC

## 2022-12-29 NOTE — Progress Notes (Signed)
Refilled diflucan 

## 2023-01-31 ENCOUNTER — Encounter: Payer: Self-pay | Admitting: Adult Health

## 2023-01-31 ENCOUNTER — Ambulatory Visit: Payer: Medicaid Other | Admitting: Adult Health

## 2023-01-31 VITALS — BP 138/88 | HR 76 | Ht 66.0 in | Wt 141.0 lb

## 2023-01-31 DIAGNOSIS — I10 Essential (primary) hypertension: Secondary | ICD-10-CM

## 2023-01-31 DIAGNOSIS — L309 Dermatitis, unspecified: Secondary | ICD-10-CM

## 2023-01-31 MED ORDER — PIMECROLIMUS 1 % EX CREA: TOPICAL_CREAM | Freq: Two times a day (BID) | CUTANEOUS | 0 refills | Status: DC

## 2023-01-31 NOTE — Progress Notes (Signed)
  Subjective:     Patient ID: Melanie Gonzales, female   DOB: 04/28/1991, 32 y.o.   MRN: [id number]  HPI Melanie Gonzales is a 32 year old black female, with SO, G2P2002 in complaining of dry spots in groin area and buttock. Noticed about 3 weeks ago, denies any itching or burning.     Component Value Date/Time   DIAGPAP  11/19/2020 1340    - Negative for intraepithelial lesion or malignancy (NILM)   DIAGPAP  01/24/2018 0000    NEGATIVE FOR INTRAEPITHELIAL LESIONS OR MALIGNANCY. BENIGN REACTIVE/REPARATIVE CHANGES.   DIAGPAP (A) 12/12/2016 0000    ATYPICAL SQUAMOUS CELLS OF UNDETERMINED SIGNIFICANCE (ASC-US).   HPVHIGH Negative 11/19/2020 1340   ADEQPAP  11/19/2020 1340    Satisfactory for evaluation; transformation zone component PRESENT.   ADEQPAP  01/24/2018 0000    Satisfactory for evaluation  endocervical/transformation zone component ABSENT.   ADEQPAP (A) 12/12/2016 0000    Satisfactory for evaluation  endocervical/transformation zone component PRESENT.    PCP is Dr Adriana Simas  Review of Systems Has dry skin patches in groin area and on buttocks Denies any itching or burning Reviewed past medical,surgical, social and family history. Reviewed medications and allergies.     Objective:   Physical Exam BP 138/88 (BP Location: Left Arm, Patient Position: Sitting, Cuff Size: Normal)   Pulse 76   Ht 5\' 6"  (1.676 m)   Wt 141 lb (64 kg)   LMP 01/29/2023   Breastfeeding No   BMI 22.76 kg/m     Skin is warm and dry, has circular whitish,dry patches in groin area and buttocks  Examination chaperoned by [name] LPN  [place] - 01/31/23 1119       Pregnancy Intention Screening   Does the patient want to become pregnant in the next year? No    Does the patient's partner want to become pregnant in the next year? No    Would the patient like to discuss contraceptive options today? No      Contraception Wrap Up   Current Method Hormonal Implant    End Method Hormonal Implant     Contraception Counseling Provided Yes             Assessment:     1. Eczema, unspecified type Had dry patches in groin area, looks like eczema to me  Will rx elidel 1% cream Meds ordered this encounter  Medications   pimecrolimus (ELIDEL) 1 % cream    Sig: Apply topically 2 (two) times daily.    Dispense:  30 g    Refill:  0    Order Specific Question:   Supervising Provider    Answer:   Despina Hidden, LUTHER H [[id number]]     2. Essential hypertension Taking meds Keep check on BP    Plan:     Follow up in 2 weeks for recheck

## 2023-02-01 ENCOUNTER — Telehealth: Payer: Self-pay | Admitting: *Deleted

## 2023-02-01 NOTE — Telephone Encounter (Signed)
Pt's insurance has denied covering Pimecrolimus 1% cream. I spoke with JAG. Pt was advised to see PCP. Pt states she will pay cash price of $132.79 because she don't have time to go see other doctors. JSY

## 2023-02-14 ENCOUNTER — Encounter: Payer: Self-pay | Admitting: Emergency Medicine

## 2023-02-14 ENCOUNTER — Ambulatory Visit: Payer: Medicaid Other | Admitting: Adult Health

## 2023-02-14 ENCOUNTER — Other Ambulatory Visit: Payer: Self-pay

## 2023-02-14 ENCOUNTER — Ambulatory Visit
Admission: EM | Admit: 2023-02-14 | Discharge: 2023-02-14 | Disposition: A | Discharge status: Home / Self Care | Payer: Medicaid Other | Attending: Family Medicine | Admitting: Family Medicine

## 2023-02-14 DIAGNOSIS — Z1152 Encounter for screening for COVID-19: Secondary | ICD-10-CM | POA: Diagnosis not present

## 2023-02-14 DIAGNOSIS — J069 Acute upper respiratory infection, unspecified: Secondary | ICD-10-CM | POA: Diagnosis not present

## 2023-02-14 LAB — POCT INFLUENZA A/B
Influenza A, POC: NEGATIVE
Influenza B, POC: NEGATIVE

## 2023-02-14 MED ORDER — PROMETHAZINE-DM 6.25-15 MG/5ML PO SYRP: 5.0000 mL | ORAL_SOLUTION | Freq: Four times a day (QID) | ORAL | 0 refills | Status: DC | PRN

## 2023-02-14 NOTE — ED Triage Notes (Signed)
Pt reports cough, sore throat, body aches, chills since Friday. Denies any known fevers.

## 2023-02-14 NOTE — ED Provider Notes (Signed)
[facility]    CSN: [id number] Arrival date & time: 02/14/23  1101      History   Chief Complaint Chief Complaint  Patient presents with   Cough    HPI Melanie Gonzales is a 32 y.o. female.   Patient presenting today with 4-day history of fever, chills, body aches, sore throat, cough, fatigue.  Denies chest pain, shortness of breath, abdominal pain, nausea vomiting or diarrhea.  So far trying over-the-counter cold and congestion medications and pain relievers with minimal relief.  Son sick with similar symptoms.    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   Hx of migraines 08/13/2012   Hypertension    Migraines    Nipple discharge in female 07/15/2014   Vaginal Pap smear, abnormal     Patient Active Problem List   Diagnosis Date Noted   Eczema 01/31/2023   Nexplanon in place 11/04/2021   H/O postpartum Preeclampsia 05/05/2021   Essential hypertension 11/19/2020   Anxiety 12/26/2017   Migraine without aura and without status migrainosus, not intractable 03/05/2016   ASCUS favor benign 12/10/2015   GERD (gastroesophageal reflux disease) 10/11/2013   Irritable bowel syndrome 10/11/2013   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 Authorizing Provider  promethazine-dextromethorphan (PROMETHAZINE-DM) 6.25-15 MG/5ML syrup Take 5 mLs by mouth 4 (four) times daily as needed. 02/14/23  Yes Particia Nearing, PA-C  acetaminophen (TYLENOL) 325 MG tablet Take 2 tablets (650 mg total) by  mouth every 4 (four) hours as needed (for pain scale < 4). 11/04/21   Allayne Stack, DO  Blood Pressure Monitor MISC 1 each by Does not apply route once a week. 05/05/21   Cheral Marker, CNM  etonogestrel (NEXPLANON) 68 MG IMPL implant 1 each by Subdermal route once.    [provider]  famotidine (PEPCID) 20 MG tablet One after supper 05/06/22   Nyoka Cowden, MD  pantoprazole (PROTONIX) 40 MG tablet TAKE 1 TABLET(40 MG) BY MOUTH DAILY 30 TO 60 MINUTES BEFORE FIRST MEAL OF THE DAY 08/08/22   Nyoka Cowden, MD  pimecrolimus (ELIDEL) 1 % cream Apply topically 2 (two) times daily. 01/31/23   Adline Potter, NP  sertraline (ZOLOFT) 100 MG tablet Take 1 tablet (100 mg total) by mouth daily. 09/22/22   Tommie Sams, DO  valsartan (DIOVAN) 160 MG tablet Take 1 tablet (160 mg total) by mouth daily. 05/06/22   Nyoka Cowden, MD    Family History Family History  Problem Relation Age of Onset   Hypertension Paternal Grandmother    Thyroid disease Paternal Grandmother        has had thyroid removed   Deep vein thrombosis Father  Ulcers Mother    GER disease Mother    Seizures Daughter    Hyperthyroidism Daughter    Other Daughter        meningitis 2 weeks after birth   Gallbladder disease Neg Hx    Colon cancer Neg Hx    Crohn's disease Neg Hx     Social History Social History   Tobacco Use   Smoking status: Former    Types: Cigars   Smokeless tobacco: Never  Vaping Use   Vaping status: Never Used  Substance Use Topics   Alcohol use: Not Currently    Comment: occ   Drug use: No     Allergies   Hydrocodone and Penicillins   Review of Systems Review of Systems PER HPI  Physical Exam Triage Vital Signs ED Triage Vitals  Encounter Vitals Group     BP 02/14/23 1134 136/88     Systolic BP Percentile --      Diastolic BP Percentile --      Pulse Rate 02/14/23 1134 82     Resp 02/14/23 1134 20     Temp 02/14/23 1134 99.4 F (37.4 C)     Temp Source  02/14/23 1134 Oral     SpO2 02/14/23 1134 95 %     Weight --      Height --      Head Circumference --      Peak Flow --      Pain Score 02/14/23 1132 5     Pain Loc --      Pain Education --      Exclude from Growth Chart --    No data found.  Updated Vital Signs BP 136/88 (BP Location: Right Arm)   Pulse 82   Temp 99.4 F (37.4 C) (Oral)   Resp 20   LMP 01/29/2023   SpO2 95%   Breastfeeding No   Visual Acuity Right Eye Distance:   Left Eye Distance:   Bilateral Distance:    Right Eye Near:   Left Eye Near:    Bilateral Near:     Physical Exam Vitals and nursing note reviewed.  Constitutional:      Appearance: Normal appearance.  HENT:     Head: Atraumatic.     Right Ear: Tympanic membrane and external ear normal.     Left Ear: Tympanic membrane and external ear normal.     Nose: Rhinorrhea present.     Mouth/Throat:     Mouth: Mucous membranes are moist.     Pharynx: Posterior oropharyngeal erythema present.  Eyes:     Extraocular Movements: Extraocular movements intact.     Conjunctiva/sclera: Conjunctivae normal.  Cardiovascular:     Rate and Rhythm: Normal rate and regular rhythm.     Heart sounds: Normal heart sounds.  Pulmonary:     Effort: Pulmonary effort is normal.     Breath sounds: Normal breath sounds. No wheezing or rales.  Musculoskeletal:        General: Normal range of motion.     Cervical back: Normal range of motion and neck supple.  Skin:    General: Skin is warm and dry.  Neurological:     Mental Status: She is alert and oriented to person, place, and time.  Psychiatric:        Mood and Affect: Mood normal.        Thought Content: Thought content normal.      UC Treatments / Results  Labs (all labs  ordered are listed, but only abnormal results are displayed) Labs Reviewed  SARS CORONAVIRUS 2 (TAT 6-24 HRS)  POCT INFLUENZA A/B    EKG   Radiology No results found.  Procedures Procedures (including critical care  time)  Medications Ordered in UC Medications - No data to display  Initial Impression / Assessment and Plan / UC Course  I have reviewed the triage vital signs and the nursing notes.  Pertinent labs & imaging results that were available during my care of the patient were reviewed by me and considered in my medical decision making (see chart for details).     Rapid flu neg, COVID pending. Treat with phenergan DM, supportive OTC medications and home care. Return for worsening sxs. Work note given  Final Clinical Impressions(s) / UC Diagnoses   Final diagnoses:  Viral URI with cough   Discharge Instructions   None    ED Prescriptions     Medication Sig Dispense Auth. Provider   promethazine-dextromethorphan (PROMETHAZINE-DM) 6.25-15 MG/5ML syrup Take 5 mLs by mouth 4 (four) times daily as needed. 100 mL Particia Nearing, [place]      PDMP not reviewed this encounter.   Particia Nearing, [place] 02/14/23 1452

## 2023-02-17 ENCOUNTER — Ambulatory Visit: Payer: Medicaid Other | Admitting: Family Medicine

## 2023-02-17 ENCOUNTER — Telehealth (INDEPENDENT_AMBULATORY_CARE_PROVIDER_SITE_OTHER): Payer: Medicaid Other | Admitting: Family Medicine

## 2023-02-17 VITALS — BP 121/78 | Temp 97.9°F | Ht 66.0 in | Wt 141.0 lb

## 2023-02-17 DIAGNOSIS — J988 Other specified respiratory disorders: Secondary | ICD-10-CM | POA: Diagnosis not present

## 2023-02-17 MED ORDER — DOXYCYCLINE HYCLATE 100 MG PO TABS
100.0000 mg | ORAL_TABLET | Freq: Two times a day (BID) | ORAL | 0 refills | Status: DC
Start: 2023-02-17 — End: 2023-08-21

## 2023-02-17 NOTE — Progress Notes (Signed)
Technical difficulties.  Visit was not able to be completed. Advised in person visit from staff.  Everlene Other DO [facility]

## 2023-02-17 NOTE — Patient Instructions (Signed)
Antibiotic as prescribed.  Call with concerns.  Take care  Dr. Adriana Simas

## 2023-02-19 DIAGNOSIS — J988 Other specified respiratory disorders: Secondary | ICD-10-CM | POA: Insufficient documentation

## 2023-02-19 NOTE — Progress Notes (Signed)
Subjective:  Patient ID: Melanie Gonzales, female    DOB: 1990/11/29  Age: 32 y.o. MRN: [id number]  CC: Chief Complaint  Patient presents with   Cough   Rash    congestion    HPI:  32 year old female with a history of chronic cough presents for evaluation of the above.  She was recently seen at urgent care on 9/17.  She reported fever, chills, body aches, sore throat, cough and fatigue.  Flu and COVID testing was negative.  She was given Promethazine DM.  Patient states that she has not improved.  She continues to be troubled by respiratory symptoms particular the cough.  Also the congestion.  No current fever.  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    Social Hx   Social History   Socioeconomic History   Marital status: Significant Other    Spouse name: Not on file   Number of children: 1   Years of education: Not on file   Highest education level: Associate degree: academic program  Occupational History   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    Employer: [employer]  Tobacco Use   Smoking status: Former    Types: Cigars   Smokeless tobacco: Never  Vaping Use   Vaping status: Never Used  Substance and Sexual Activity   Alcohol use: Not Currently    Comment: occ   Drug use: No   Sexual activity: Yes    Birth control/protection: Implant  Other Topics Concern   Not on file  Social History Narrative   Not on file   Social Determinants of Health   Financial Resource Strain: Low Risk  (09/21/2022)   Overall Financial Resource Strain (CARDIA)     Difficulty of Paying Living Expenses: Not very hard  Food Insecurity: No Food Insecurity (09/21/2022)   Hunger Vital Sign    Worried About Running Out of Food in the Last Year: Never true    Ran Out of Food in the Last Year: Never true  Transportation Needs: No Transportation Needs (09/21/2022)   PRAPARE - Administrator, Civil Service (Medical): No    Lack of Transportation (Non-Medical): No  Physical Activity: Insufficiently Active (09/21/2022)   Exercise Vital Sign    Days of Exercise per Week: 1 day    Minutes of Exercise per Session: 20 min  Stress: Stress Concern Present (09/21/2022)   [employer] of Occupational Health - Occupational Stress Questionnaire    Feeling of Stress : Very much  Social Connections: Moderately Integrated (09/21/2022)   Social Connection and Isolation Panel [NHANES]    Frequency of Communication with Friends and Family: More than three times a week    Frequency of Social Gatherings with Friends and Family: Once a week    Attends Religious Services: 1 to 4 times per year    Active Member of [employer] or Organizations: No    Attends Engineer, structural: Not on file    Marital Status: Living with partner    Review of Systems Per HPI  Objective:  BP 121/78   Temp 97.9 F (36.6 C) (  Oral)   Ht 5\' 6"  (1.676 m)   Wt 141 lb (64 kg)   LMP 01/29/2023   BMI 22.76 kg/m      02/17/2023    4:07 PM 02/14/2023   11:34 AM 01/31/2023   11:38 AM  BP/Weight  Systolic BP 121 136 138  Diastolic BP 78 88 88  Wt. (Lbs) 141    BMI 22.76 kg/m2      Physical Exam Vitals and nursing note reviewed.  Constitutional:      General: She is not in acute distress. HENT:     Head: Normocephalic and atraumatic.  Eyes:     General:        Right eye: No discharge.        Left eye: No discharge.     Conjunctiva/sclera: Conjunctivae normal.  Cardiovascular:     Rate and Rhythm: Normal rate and regular rhythm.  Pulmonary:     Effort: Pulmonary effort  is normal.     Breath sounds: Normal breath sounds. No wheezing or rales.  Neurological:     Mental Status: She is alert.     Lab Results  Component Value Date   WBC 5.3 11/25/2021   HGB 13.5 11/25/2021   HCT 40.0 11/25/2021   PLT 415 11/25/2021   GLUCOSE 86 11/25/2021   CHOL 225 (H) 11/19/2020   TRIG 67 11/19/2020   HDL 74 11/19/2020   LDLCALC 139 (H) 11/19/2020   ALT 25 11/25/2021   AST 21 11/25/2021   NA 143 11/25/2021   K 3.9 11/25/2021   CL 104 11/25/2021   CREATININE 0.52 (L) 11/25/2021   BUN 12 11/25/2021   CO2 22 11/25/2021   TSH 1.400 11/19/2020     Assessment & Plan:   Problem List Items Addressed This Visit       Respiratory   Respiratory infection - Primary    Given persistent symptoms, and lack of improvement, treating with doxycycline.       Meds ordered this encounter  Medications   doxycycline (VIBRA-TABS) 100 MG tablet    Sig: Take 1 tablet (100 mg total) by mouth 2 (two) times daily. Take with food and water.    Dispense:  14 tablet    Refill:  0    Follow-up:  Return if symptoms worsen or fail to improve.  Everlene Other DO [facility]

## 2023-02-19 NOTE — Assessment & Plan Note (Signed)
Given persistent symptoms, and lack of improvement, treating with doxycycline.

## 2023-03-27 ENCOUNTER — Encounter: Payer: Self-pay | Admitting: Family Medicine

## 2023-03-28 ENCOUNTER — Other Ambulatory Visit: Payer: Self-pay | Admitting: Family Medicine

## 2023-03-28 MED ORDER — SERTRALINE HCL 100 MG PO TABS: 150.0000 mg | ORAL_TABLET | Freq: Every day | ORAL | 1 refills | Status: DC

## 2023-05-07 ENCOUNTER — Other Ambulatory Visit: Payer: Self-pay | Admitting: Internal Medicine

## 2023-05-18 ENCOUNTER — Encounter: Payer: Self-pay | Admitting: Internal Medicine

## 2023-05-18 ENCOUNTER — Other Ambulatory Visit: Payer: Self-pay | Admitting: Internal Medicine

## 2023-05-18 MED ORDER — VALSARTAN 160 MG PO TABS: 160.0000 mg | ORAL_TABLET | Freq: Every day | ORAL | 0 refills | Status: DC

## 2023-06-22 ENCOUNTER — Encounter: Payer: Self-pay | Admitting: Family Medicine

## 2023-06-23 ENCOUNTER — Other Ambulatory Visit: Payer: Self-pay | Admitting: Family Medicine

## 2023-06-23 MED ORDER — VALSARTAN 160 MG PO TABS: 160.0000 mg | ORAL_TABLET | Freq: Every day | ORAL | 3 refills | Status: AC

## 2023-08-14 HISTORY — PX: ROOT CANAL: SHX2363

## 2023-08-21 ENCOUNTER — Ambulatory Visit: Admitting: Adult Health

## 2023-08-21 ENCOUNTER — Encounter: Payer: Self-pay | Admitting: Adult Health

## 2023-08-21 VITALS — BP 125/85 | HR 83 | Ht 66.0 in | Wt 142.5 lb

## 2023-08-21 DIAGNOSIS — R6882 Decreased libido: Secondary | ICD-10-CM

## 2023-08-21 NOTE — Progress Notes (Signed)
  Subjective:     Patient ID: Melanie Gonzales, female   DOB: 1990-10-07, 33 y.o.   MRN: [id number]  HPI Melanie Gonzales is a 33 year old black female, engaged, G2P2002 in complaining of low sex drive. She enjoys sex and has orgasm but but does not care if has sex or not, and it is beginning to affect relationship.     Component Value Date/Time   DIAGPAP  11/19/2020 1340    - Negative for intraepithelial lesion or malignancy (NILM)   DIAGPAP  01/24/2018 0000    NEGATIVE FOR INTRAEPITHELIAL LESIONS OR MALIGNANCY. BENIGN REACTIVE/REPARATIVE CHANGES.   DIAGPAP (A) 12/12/2016 0000    ATYPICAL SQUAMOUS CELLS OF UNDETERMINED SIGNIFICANCE (ASC-US).   HPVHIGH Negative 11/19/2020 1340   ADEQPAP  11/19/2020 1340    Satisfactory for evaluation; transformation zone component PRESENT.   ADEQPAP  01/24/2018 0000    Satisfactory for evaluation  endocervical/transformation zone component ABSENT.   ADEQPAP (A) 12/12/2016 0000    Satisfactory for evaluation  endocervical/transformation zone component PRESENT.   PCP is Dr Adriana Simas  Review of Systems +low sex drive Does have orgasm Reviewed past medical,surgical, social and family history. Reviewed medications and allergies.     Objective:   Physical Exam BP 125/85 (BP Location: Left Arm, Patient Position: Sitting, Cuff Size: Normal)   Pulse 83   Ht 5\' 6"  (1.676 m)   Wt 142 lb 8 oz (64.6 kg)   LMP 07/26/2023 (Approximate)   Breastfeeding No   BMI 23.00 kg/m     Skin warm and dry.  Lungs: clear to ausculation bilaterally. Cardiovascular: regular rate and rhythm.  Fall risk is low  Upstream - 08/21/23 1109       Pregnancy Intention Screening   Does the patient want to become pregnant in the next year? No    Does the patient's partner want to become pregnant in the next year? No    Would the patient like to discuss contraceptive options today? No      Contraception Wrap Up   Current Method Hormonal Implant    End Method Hormonal Implant     Contraception Counseling Provided Yes             Assessment:     1. Decreased sex drive (Primary) Has low sex drive, does enjoy sex and has orgasm but does not care if has or not Discussed with zoloft and Nexplanon can decrease drive, does not want to change zoloft doing well on it Discussed making date night once a week to have sex Have sex even if not in mood to [employer] and if wants make appt with Drenda Freeze, but it is expensive and not sure medicaid covers     Plan:     Return in 3 months for pap and physical

## 2023-10-09 ENCOUNTER — Other Ambulatory Visit: Payer: Self-pay | Admitting: Family Medicine

## 2023-11-21 ENCOUNTER — Ambulatory Visit: Admitting: Adult Health

## 2023-12-06 ENCOUNTER — Other Ambulatory Visit: Payer: Self-pay | Admitting: Adult Health

## 2023-12-06 MED ORDER — METRONIDAZOLE 0.75 % VA GEL: 1.0000 | Freq: Every day | VAGINAL | 0 refills | Status: DC

## 2023-12-06 NOTE — Progress Notes (Signed)
Rx metrogel for +BV

## 2023-12-13 ENCOUNTER — Encounter: Payer: Self-pay | Admitting: Women's Health

## 2023-12-13 ENCOUNTER — Ambulatory Visit: Admitting: Women's Health

## 2023-12-13 ENCOUNTER — Other Ambulatory Visit (HOSPITAL_COMMUNITY)
Admission: RE | Admit: 2023-12-13 | Discharge: 2023-12-13 | Disposition: A | Discharge status: Home / Self Care | Source: Ambulatory Visit | Attending: Women's Health | Admitting: Women's Health

## 2023-12-13 VITALS — BP 136/82 | HR 76 | Ht 66.0 in | Wt 141.0 lb

## 2023-12-13 DIAGNOSIS — Z01419 Encounter for gynecological examination (general) (routine) without abnormal findings: Secondary | ICD-10-CM | POA: Insufficient documentation

## 2023-12-13 DIAGNOSIS — N898 Other specified noninflammatory disorders of vagina: Secondary | ICD-10-CM | POA: Insufficient documentation

## 2023-12-13 DIAGNOSIS — Z975 Presence of (intrauterine) contraceptive device: Secondary | ICD-10-CM | POA: Diagnosis not present

## 2023-12-13 DIAGNOSIS — Z124 Encounter for screening for malignant neoplasm of cervix: Secondary | ICD-10-CM

## 2023-12-13 DIAGNOSIS — Z113 Encounter for screening for infections with a predominantly sexual mode of transmission: Secondary | ICD-10-CM | POA: Insufficient documentation

## 2023-12-13 NOTE — Progress Notes (Signed)
 WELL-WOMAN EXAMINATION Patient name: Melanie Gonzales MRN [id number]  Date of birth: Aug 07, 1990 Chief Complaint:   Gynecologic Exam (3 months pap , physcial)  History of Present Illness:   Melanie Gonzales is a 33 y.o. G109P2002 African-American female being seen today for a routine well-woman exam.  Current complaints: decreased sex drive, no desire at all, putting strain on relationship. Is on zoloft , more anxious than depressed-wonders if could do a prn anxiety medicine instead of daily antidepressant. Has 2 children. No foreplay. Does sometimes enjoy it when she does have sex.  Occ leaks milk still, baby is 2 Vag d/c and odor last week, used metrogel -finished 2d ago, wants a swab to check vaginitis/STDs  PCP: [facility]      No LMP recorded. Patient has had an implant. The current method of family planning is Nexplanon , inserted 11/04/21  Last pap 11/19/20. Results were: NILM w/ HRHPV negative. H/O abnormal pap: yes Last mammogram: never. Results were: N/A. Family h/o breast cancer: no Last colonoscopy: never. Results were: N/A. Family h/o colorectal cancer: no     12/13/2023    8:52 AM 09/22/2022    1:58 PM 08/11/2022    8:55 AM 01/25/2022   10:09 AM 08/12/2021   10:30 AM  Depression screen PHQ 2/9  Decreased Interest 2 0 2 0 1  Down, Depressed, Hopeless 0 0 1 0 0  PHQ - 2 Score 2 0 3 0 1  Altered sleeping 2 0 2  2  Tired, decreased energy 2 0 2  2  Change in appetite 2 1 1  1   Feeling bad or failure about yourself  0 1 0  0  Trouble concentrating 0 1 3  0  Moving slowly or fidgety/restless 0 0 0  0  Suicidal thoughts 0 0 0  0  PHQ-9 Score 8 3 11  6   Difficult doing work/chores  Somewhat difficult Very difficult          12/13/2023    8:53 AM 09/22/2022    1:59 PM 08/11/2022    8:55 AM 08/12/2021   10:31 AM  GAD 7 : Generalized Anxiety Score  Nervous, Anxious, on Edge 1 2 3  0  Control/stop worrying 1 1 3  0  Worry too much - different things 1 1 2 1   Trouble relaxing 1 0 2 1   Restless 0 0 0 0  Easily annoyed or irritable 1 2 3 1   Afraid - awful might happen 0 0 0 0  Total GAD 7 Score 5 6 13 3   Anxiety Difficulty  Somewhat difficult Very difficult      Review of Systems:   Pertinent items are noted in HPI Denies any headaches, blurred vision, fatigue, shortness of breath, chest pain, abdominal pain, abnormal vaginal discharge/itching/odor/irritation, problems with periods, bowel movements, urination, or intercourse unless otherwise stated above. Pertinent History Reviewed:  Reviewed past medical,surgical, social and family history.  Reviewed problem list, medications and allergies. Physical Assessment:   Vitals:   12/13/23 0841  BP: 136/82  Pulse: 76  Weight: 141 lb (64 kg)  Height: 5' 6 (1.676 m)  Body mass index is 22.76 kg/m.        Physical Examination:   General appearance - well appearing, and in no distress  Mental status - alert, oriented to person, place, and time  Psych:  She has a normal mood and affect  Skin - warm and dry, normal color, no suspicious lesions noted  Chest - effort normal, all lung fields  clear to auscultation bilaterally  Heart - normal rate and regular rhythm  Neck:  midline trachea, no thyromegaly or nodules  Breasts - breasts appear normal, no suspicious masses, no skin or nipple changes or  axillary nodes  Abdomen - soft, nontender, nondistended, no masses or organomegaly  Pelvic - VULVA: normal appearing vulva with no masses, tenderness or lesions  VAGINA: normal appearing vagina with normal color and discharge, no lesions, thick white clumpy d/c  CERVIX: normal appearing cervix without discharge or lesions, no CMT  Thin prep pap is done w/ HR HPV cotesting  UTERUS: uterus is felt to be normal size, shape, consistency and nontender   ADNEXA: No adnexal masses or tenderness noted.  Extremities:  No swelling or varicosities noted  Chaperone: Peggy Dones  No results found for this or any previous visit (from the  past 24 hours).  Assessment & Plan:  1) Well-Woman Exam  2) Decreased libido> discussed dep/anx, antidepressants, birth control, having children- all of these things can affect libido. Recommended regular date nights, planning sex to get back in habit, adequate foreplay. Feels more anxious than depressed, pt wonders if could do prn anxiety med vs daily antidepressant- make appt w/ PCP to see if this would be an option to try. If tries everything and not helping, Addyi could potentially be an option but is usually only if no other reason for decreased libido found  3) Vaginal d/c w/ recent odor> CV swab  4) STD screen  Labs/procedures today: pap, CV swab  Mammogram: @ 32yo, or sooner if problems Colonoscopy: @ 32yo, or sooner if problems  No orders of the defined types were placed in this encounter.   Meds: No orders of the defined types were placed in this encounter.   Follow-up: Return in about 1 year (around 12/12/2024) for Physical.  Suzen JONELLE Fetters CNM, WHNP-BC 12/13/2023 9:22 AM

## 2023-12-14 ENCOUNTER — Ambulatory Visit: Payer: Self-pay | Admitting: Women's Health

## 2023-12-14 LAB — CERVICOVAGINAL ANCILLARY ONLY
Bacterial Vaginitis (gardnerella): NEGATIVE
Candida Glabrata: NEGATIVE
Candida Vaginitis: NEGATIVE
Chlamydia: NEGATIVE
Comment: NEGATIVE
Comment: NEGATIVE
Comment: NEGATIVE
Comment: NEGATIVE
Comment: NEGATIVE
Comment: NORMAL
Neisseria Gonorrhea: NEGATIVE
Trichomonas: NEGATIVE

## 2023-12-19 LAB — CYTOLOGY - PAP
Comment: NEGATIVE
Diagnosis: NEGATIVE
High risk HPV: NEGATIVE

## 2023-12-27 ENCOUNTER — Ambulatory Visit

## 2023-12-27 ENCOUNTER — Other Ambulatory Visit (HOSPITAL_COMMUNITY)
Admission: RE | Admit: 2023-12-27 | Discharge: 2023-12-27 | Disposition: A | Discharge status: Home / Self Care | Source: Ambulatory Visit | Attending: Obstetrics & Gynecology | Admitting: Obstetrics & Gynecology

## 2023-12-27 DIAGNOSIS — N898 Other specified noninflammatory disorders of vagina: Secondary | ICD-10-CM | POA: Insufficient documentation

## 2023-12-27 NOTE — Progress Notes (Signed)
   NURSE VISIT- VAGINITIS  SUBJECTIVE:  Melanie Gonzales is a 33 y.o. [id number] GYN patientfemale here for a vaginal swab for vaginitis screening.  She reports the following symptoms:vaginal odor and discharge described as white and curd-like for 1 week. Denies any new sexual partners Denies abnormal vaginal bleeding, significant pelvic pain, fever, or UTI symptoms.  OBJECTIVE:  There were no vitals taken for this visit.  Appears well, in no apparent distress  ASSESSMENT: Vaginal swab for vaginitis screening and mycoplasms  PLAN: Self-collected vaginal probe for mycoplasms, Bacterial Vaginosis, Yeast sent to lab Treatment: to be determined once results are received Follow-up as needed if symptoms persist/worsen, or new symptoms develop  Melanie Gonzales  12/27/2023 9:28 AM

## 2023-12-28 ENCOUNTER — Ambulatory Visit: Payer: Self-pay | Admitting: Women's Health

## 2023-12-28 LAB — CERVICOVAGINAL ANCILLARY ONLY
Bacterial Vaginitis (gardnerella): POSITIVE — AB
Candida Glabrata: NEGATIVE
Candida Vaginitis: NEGATIVE
Comment: NEGATIVE
Comment: NEGATIVE
Comment: NEGATIVE

## 2023-12-28 LAB — MYCOPLASMA / UREAPLASMA CULTURE

## 2023-12-28 MED ORDER — METRONIDAZOLE 500 MG PO TABS: 500.0000 mg | ORAL_TABLET | Freq: Two times a day (BID) | ORAL | 0 refills | Status: DC

## 2024-01-03 NOTE — Telephone Encounter (Signed)
-----   Message from Hawley sent at 12/29/2023  1:19 PM EDT ----- This test was cancelled needs to be recollected with correct swab, will you let her know. THX

## 2024-01-03 NOTE — Telephone Encounter (Signed)
 Called patient to inform that mycoplasm test was cancelled and recollection was needed if she desired. Patient states she has a few days left for the metronidazole  and will finish that first and will make an appointment if she is still experiencing symptoms.

## 2024-04-18 ENCOUNTER — Encounter: Payer: Self-pay | Admitting: Women's Health

## 2024-04-23 ENCOUNTER — Telehealth: Admitting: Physician Assistant

## 2024-04-23 DIAGNOSIS — J029 Acute pharyngitis, unspecified: Secondary | ICD-10-CM

## 2024-04-23 MED ORDER — LIDOCAINE VISCOUS HCL 2 % MT SOLN: 15.0000 mL | OROMUCOSAL | 0 refills | Status: AC | PRN

## 2024-04-23 NOTE — Progress Notes (Signed)
 We are sorry that you are not feeling well.  Here is how we plan to help!  Your symptoms indicate a likely viral infection (Pharyngitis).   Pharyngitis is inflammation in the back of the throat which can cause a sore throat, scratchiness and sometimes difficulty swallowing.   Pharyngitis is typically caused by a respiratory virus and will just run its course.  Please keep in mind that your symptoms could last up to 10 days.    For throat pain, we recommend over the counter oral pain relief medications such as acetaminophen  or aspirin, or anti-inflammatory medications such as ibuprofen  or naproxen sodium.  Topical treatments such as oral throat lozenges or sprays may be used as needed. For throat pain, I have prescribed a Viscous Lidocaine  2% solution. Swallow 5-10 mL every 4-6 hours as needed for sore throat. DO NOT eat or drink anything for 15-20 minutes after swallowing to allow the medication to coat the throat.SABRA  Avoid close contact with loved ones, especially the very young and elderly.  Remember to wash your hands thoroughly throughout the day as this is the number one way to prevent the spread of infection! We also recommend that you periodically wipe down door knobs and counters with disinfectant.  After careful review of your answers, I would not recommend and antibiotic for your condition.  Antibiotics should not be used to treat conditions that we suspect are caused by viruses like the virus that causes the common cold or flu.  Providers prescribe antibiotics to treat infections caused by bacteria. Antibiotics are very powerful in treating bacterial infections when they are used properly.  To maintain their effectiveness, they should be used only when necessary.  Overuse of antibiotics has resulted in the development of super bugs that are resistant to treatment!    Some people with strep throat, however, can have atypical symptoms. As such, if anything continued to progress despite treatment  recommendations, you may need formal testing in clinic or office.  Home Care: Only take medications as instructed by your medical team. Do not drink alcohol while taking these medications. A steam or ultrasonic humidifier can help congestion.  You can place a towel over your head and breathe in the steam from hot water coming from a faucet. Avoid close contacts especially the very young and the elderly. Cover your mouth when you cough or sneeze. Always remember to wash your hands.  Get Help Right Away If: You develop worsening fever or throat pain. You develop a severe head ache or visual changes. Your symptoms persist after you have completed your treatment plan.  Make sure you Understand these instructions. Will watch your condition. Will get help right away if you are not doing well or get worse.  Your e-visit answers were reviewed by a board certified advanced clinical practitioner to complete your personal care plan.  Depending on the condition, your plan could have included both over the counter or prescription medications.  If there is a problem please reply once you have received a response from your provider.  Your safety is important to us .  If you have drug allergies check your prescription carefully.    You can use MyChart to ask questions about todays visit, request a non-urgent call back, or ask for a work or school excuse for 24 hours related to this e-Visit. If it has been greater than 24 hours you will need to follow up with your provider, or enter a new e-Visit to address those concerns.  You will get an e-mail in the next two days asking about your experience.  I hope that your e-visit has been valuable and will speed your recovery. Thank you for using e-visits.   I have spent 5 minutes in review of e-visit questionnaire, review and updating patient chart, medical decision making and response to patient.   Elsie Velma Lunger, PA-C

## 2024-04-23 NOTE — Progress Notes (Signed)
 Message sent to patient requesting further input regarding current symptoms. Awaiting patient response.

## 2024-04-26 ENCOUNTER — Telehealth: Admitting: Family Medicine

## 2024-04-26 DIAGNOSIS — B9689 Other specified bacterial agents as the cause of diseases classified elsewhere: Secondary | ICD-10-CM

## 2024-04-26 MED ORDER — DOXYCYCLINE HYCLATE 100 MG PO TABS: 100.0000 mg | ORAL_TABLET | Freq: Two times a day (BID) | ORAL | 0 refills | Status: AC

## 2024-04-26 NOTE — Progress Notes (Signed)
 E-Visit for Sinus Problems  We are sorry that you are not feeling well.  Here is how we plan to help!  Based on what you have shared with me it looks like you have sinusitis.  Sinusitis is inflammation and infection in the sinus cavities of the head.  Based on your presentation I believe you most likely have Acute Bacterial Sinusitis.  This is an infection caused by bacteria and is treated with antibiotics. I have prescribed Doxycycline  100mg  by mouth twice a day for 7 days. You may use an oral decongestant such as Mucinex  D or if you have glaucoma or high blood pressure use plain Mucinex . Saline nasal spray help and can safely be used as often as needed for congestion.  If you develop worsening sinus pain, fever or notice severe headache and vision changes, or if symptoms are not better after completion of antibiotic, please schedule an appointment with a health care provider.    Sinus infections are not as easily transmitted as other respiratory infection, however we still recommend that you avoid close contact with loved ones, especially the very young and elderly.  Remember to wash your hands thoroughly throughout the day as this is the number one way to prevent the spread of infection!  Home Care: Only take medications as instructed by your medical team. Complete the entire course of an antibiotic. Do not take these medications with alcohol. A steam or ultrasonic humidifier can help congestion.  You can place a towel over your head and breathe in the steam from hot water  coming from a faucet. Avoid close contacts especially the very young and the elderly. Cover your mouth when you cough or sneeze. Always remember to wash your hands.  Get Help Right Away If: You develop worsening fever or sinus pain. You develop a severe head ache or visual changes. Your symptoms persist after you have completed your treatment plan.  Make sure you Understand these instructions. Will watch your  condition. Will get help right away if you are not doing well or get worse.  Your e-visit answers were reviewed by a board certified advanced clinical practitioner to complete your personal care plan.  Depending on the condition, your plan could have included both over the counter or prescription medications.  If there is a problem please reply  once you have received a response from your provider.  Your safety is important to us .  If you have drug allergies check your prescription carefully.    You can use MyChart to ask questions about today's visit, request a non-urgent call back, or ask for a work or school excuse for 24 hours related to this e-Visit. If it has been greater than 24 hours you will need to follow up with your provider, or enter a new e-Visit to address those concerns.  You will get an e-mail in the next two days asking about your experience.  I hope that your e-visit has been valuable and will speed your recovery. Thank you for using e-visits.  I have spent 5 minutes in review of e-visit questionnaire, review and updating patient chart, medical decision making and response to patient.   Shenna Brissette, FNP

## 2024-04-29 ENCOUNTER — Other Ambulatory Visit: Payer: Self-pay | Admitting: Women's Health

## 2024-04-29 MED ORDER — ADDYI 100 MG PO TABS: 1.0000 | ORAL_TABLET | Freq: Every day | ORAL | 1 refills | Status: AC

## 2024-06-25 ENCOUNTER — Encounter: Payer: Self-pay | Admitting: Women's Health

## 2024-06-25 ENCOUNTER — Other Ambulatory Visit: Payer: Self-pay | Admitting: Women's Health
